# Patient Record
Sex: Female | Born: 1995
Health system: Southern US, Community
[De-identification: ages and names within clinical notes are randomized; demographics above are authoritative.]

## PROBLEM LIST (undated history)

## (undated) DIAGNOSIS — R569 Unspecified convulsions: Secondary | ICD-10-CM

## (undated) DIAGNOSIS — E559 Vitamin D deficiency, unspecified: Secondary | ICD-10-CM

## (undated) DIAGNOSIS — G919 Hydrocephalus, unspecified: Secondary | ICD-10-CM

## (undated) DIAGNOSIS — L732 Hidradenitis suppurativa: Secondary | ICD-10-CM

## (undated) DIAGNOSIS — G809 Cerebral palsy, unspecified: Secondary | ICD-10-CM

## (undated) DIAGNOSIS — Z8669 Personal history of other diseases of the nervous system and sense organs: Secondary | ICD-10-CM

## (undated) DIAGNOSIS — D649 Anemia, unspecified: Secondary | ICD-10-CM

## (undated) DIAGNOSIS — K5900 Constipation, unspecified: Secondary | ICD-10-CM

## (undated) DIAGNOSIS — Z982 Presence of cerebrospinal fluid drainage device: Secondary | ICD-10-CM

## (undated) DIAGNOSIS — K219 Gastro-esophageal reflux disease without esophagitis: Secondary | ICD-10-CM

## (undated) DIAGNOSIS — G43909 Migraine, unspecified, not intractable, without status migrainosus: Secondary | ICD-10-CM

## (undated) DIAGNOSIS — N319 Neuromuscular dysfunction of bladder, unspecified: Secondary | ICD-10-CM

## (undated) DIAGNOSIS — E039 Hypothyroidism, unspecified: Secondary | ICD-10-CM

## (undated) HISTORY — DX: Gastro-esophageal reflux disease without esophagitis: K21.9

## (undated) HISTORY — PX: VENTRICULOPERITONEAL SHUNT: SHX204

## (undated) HISTORY — PX: HAMSTRING LENGTHENING: SHX1722

## (undated) HISTORY — DX: Cerebral palsy, unspecified: G80.9

## (undated) HISTORY — DX: Neuromuscular dysfunction of bladder, unspecified: N31.9

## (undated) HISTORY — DX: Personal history of other diseases of the nervous system and sense organs: Z86.69

## (undated) HISTORY — DX: Hypothyroidism, unspecified: E03.9

## (undated) HISTORY — DX: Migraine, unspecified, not intractable, without status migrainosus: G43.909

## (undated) HISTORY — PX: DENTAL EXAMINATION UNDER ANESTHESIA: SHX1447

## (undated) HISTORY — PX: EYE SURGERY: SHX253

## (undated) HISTORY — PX: TENDON LENGTHENING: SHX395

## (undated) HISTORY — DX: Vitamin D deficiency, unspecified: E55.9

## (undated) HISTORY — DX: Constipation, unspecified: K59.00

## (undated) HISTORY — PX: BOTOX INJECTION: SHX5754

---
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
## 2015-01-12 ENCOUNTER — Encounter: Payer: Self-pay | Admitting: Physical Therapy

## 2015-01-12 ENCOUNTER — Ambulatory Visit: Payer: Medicaid Other | Attending: Pediatrics | Admitting: Physical Therapy

## 2015-01-12 DIAGNOSIS — Z7409 Other reduced mobility: Secondary | ICD-10-CM

## 2015-01-12 DIAGNOSIS — M6281 Muscle weakness (generalized): Secondary | ICD-10-CM

## 2015-01-12 NOTE — Therapy (Signed)
[facility] [address] [place], [place], [address] Phone: [phone]   Fax:  [phone]  Pediatric Physical Therapy Treatment  Patient Details  Name: Jody [name] MRN: [id number] Date of Birth: 01-02-1996 Referring Provider:  Velvet Bathe, MD  Encounter date: 01/12/2015      End of Session - 01/12/15 1655    Visit Number 58   Date for PT Re-Evaluation 05/24/15   Authorization Type Medicaid   Authorization Time Period 12/04/14-05/24/15   Authorization - Visit Number 3   Authorization - Number of Visits 24   PT Start Time 1505   PT Stop Time 1550   PT Time Calculation (min) 45 min   Equipment Utilized During Treatment Orthotics   Activity Tolerance Patient tolerated treatment well   Behavior During Therapy Willing to participate      Past Medical History  Diagnosis Date  . Seizures   . VP (ventriculoperitoneal) shunt status   . Hydrocephaly     Past Surgical History  Procedure Laterality Date  . Shunt replacement  2008    5th shunt for pt.   . Eye surgery    . Hamstring lengthening    . Tendon lengthening    . Botox injection    . Dental examination under anesthesia      There were no vitals filed for this visit.  Visit Diagnosis:Muscle weakness  Decreased mobility and endurance                    Pediatric PT Treatment - 01/12/15 1555    Subjective Information   Patient Comments Ariyona reports that work is going well.   PT Pediatric Exercise/Activities   Strengthening Activities Transitioning w/c to mat with SBA. Transition from supine to sitting at edge of mat with SBA to min assist x4 trials to each direction. Core strengthening with crunches with green wedge behind back x3 reps.   Balance Activities Performed   Balance Details Sitting on edge of mat without feet on floor with SBA.   Pain   Pain Assessment No/denies pain                 Patient Education -  01/12/15 1654    Education Provided Yes Recommended [name] to participate in transfers from supine to sitting with little assist especially from the left to the right. Patient and mother verbalized understanding.  Mom observed part of session.           Peds PT Short Term Goals - 01/12/15 1701    PEDS PT  SHORT TERM GOAL #3   Title Sister will be able to  transition from supine to sitting, sitting to supine with minimal assist 2/3 trials. right and left.    Baseline requires moderate assist to the right to left. (As of 12/01/14, Cyara required min-mod assist to transition from supine to sitting right to left.)   Time 6   Period Months   Status On-going   PEDS PT  SHORT TERM GOAL #5   Title Amberlin will be able to stand at ladder for at least 1 minute 3 out of 5 trials to demonstrate improve strength and balance  to assist with ADL activities with CGA   Baseline Max standing 30 seconds but averages about 20 seconds with minimal assist, AFOs donned. (As of 8//16, Tassie was able to stand for 1 minute for the first trial and 25 seconds for the last 2 trials with min A.)  Time 6   Period Months   Status On-going   PEDS PT  SHORT TERM GOAL #6   Title Kao will be able to independently lift her legs onto the mat when transitioning from sit to supine.   Baseline requires moderate assist to lift her legs onto the mat (As of 12/01/14, Mickie needed min assist to lift one leg but could independently lift the other leg.)   Time 6   Period Months   Status On-going          Peds PT Long Term Goals - 01/12/15 1701    PEDS PT  LONG TERM GOAL #1   Title Annaclaire will be able to assist with all transfers throughout the day and stand to help improve her ADLs   Time 6   Period Months   Status On-going          Plan - 01/12/15 1656    Clinical Impression Statement Garnet was able to transition from supine to sit with SBA-min assist. She has an easier time transferring to the right than the left,  requiring greater assistance to transfer to the left. Once she learned the motor planning piece, Maeryn had an easier time tranferring but still required some assistance to push through bilateral upper extremities. Karsynn did well sitting at edge of mat with feet unsupported. She had a lot of unsteadiness through her core in sitting but she did well self correcting to upright posture with minimal verbal cues to sit upright. Layni performed 3 reps of crunches with wedge behind her and she required increased time to complete but was able to sit up with only light assistance at back.   PT plan Continue with strengthening.      Problem List There are no active problems to display for this patient.   [name], SPT 01/12/2015, 5:02 PM  [name], PT 01/13/2015 1:38 PM Phone: [phone] Fax: [phone]  [facility] [address] [place], [place], [address] Phone: [phone]   Fax:  [phone]

## 2015-01-19 ENCOUNTER — Encounter: Payer: Self-pay | Admitting: Physical Therapy

## 2015-01-19 ENCOUNTER — Ambulatory Visit: Payer: Medicaid Other | Admitting: Physical Therapy

## 2015-01-19 DIAGNOSIS — M6281 Muscle weakness (generalized): Secondary | ICD-10-CM

## 2015-01-19 DIAGNOSIS — Z7409 Other reduced mobility: Secondary | ICD-10-CM

## 2015-01-19 NOTE — Therapy (Signed)
[facility] [address] [place], [place], [address] Phone: [phone]   Fax:  [phone]  Pediatric Physical Therapy Treatment  Patient Details  Name: Jody [name] MRN: [id number] Date of Birth: 28-May-1995 Referring Provider:  Velvet Bathe, MD  Encounter date: 01/19/2015      End of Session - 01/19/15 1724    Visit Number 59   Date for PT Re-Evaluation 05/24/15   Authorization Type Medicaid   Authorization Time Period 12/04/14-05/24/15   Authorization - Visit Number 4   Authorization - Number of Visits 24   PT Start Time 1515   PT Stop Time 1600   PT Time Calculation (min) 45 min   Equipment Utilized During Treatment Orthotics   Activity Tolerance Patient tolerated treatment well   Behavior During Therapy Willing to participate      Past Medical History  Diagnosis Date  . Seizures   . VP (ventriculoperitoneal) shunt status   . Hydrocephaly     Past Surgical History  Procedure Laterality Date  . Shunt replacement  2008    5th shunt for pt.   . Eye surgery    . Hamstring lengthening    . Tendon lengthening    . Botox injection    . Dental examination under anesthesia      There were no vitals filed for this visit.  Visit Diagnosis:Muscle weakness  Decreased mobility and endurance                    Pediatric PT Treatment - 01/19/15 1714    Subjective Information   Patient Comments Maison and mom report that things are going well at home.   PT Pediatric Exercise/Activities   Strengthening Activities Sit to stands x6 trials with maximum hold about 30 seconds with min assist.   Balance Activities Performed   Balance Details Sitting upright at edge of mat with feet unsupported with supervision.   Therapeutic Activities   Therapeutic Activity Details Sitting at edge of mat with feet unsupported laterally leaning to the left and right and return to upright sitting x4 trials in each  direction with supervision.   Pain   Pain Assessment No/denies pain                 Patient Education - 01/19/15 1719    Education Provided Yes   Education Description Instructed Mabelle to push through bilateral lower extremities and bring nose over toes to stand up.   Person(s) Educated Patient;Mother   Method Education Verbal explanation;Observed session   Comprehension Verbalized understanding          Peds PT Short Term Goals - 01/19/15 1727    PEDS PT  SHORT TERM GOAL #3   Title Denyce will be able to  transition from supine to sitting, sitting to supine with minimal assist 2/3 trials. right and left.    Baseline requires moderate assist to the right to left. (As of 12/01/14, Alania required min-mod assist to transition from supine to sitting right to left.)   Time 6   Period Months   Status On-going   PEDS PT  SHORT TERM GOAL #5   Title Clotiel will be able to stand at ladder for at least 1 minute 3 out of 5 trials to demonstrate improve strength and balance  to assist with ADL activities with CGA   Baseline Max standing 30 seconds but averages about 20 seconds with minimal assist, AFOs donned. (As of 8//16, Cayleigh was  able to stand for 1 minute for the first trial and 25 seconds for the last 2 trials with min A.)   Time 6   Period Months   Status On-going   PEDS PT  SHORT TERM GOAL #6   Title Murielle will be able to independently lift her legs onto the mat when transitioning from sit to supine.   Baseline requires moderate assist to lift her legs onto the mat (As of 12/01/14, Ladon needed min assist to lift one leg but could independently lift the other leg.)   Time 6   Period Months   Status On-going          Peds PT Long Term Goals - 01/19/15 1728    PEDS PT  LONG TERM GOAL #1   Title Sundai will be able to assist with all transfers throughout the day and stand to help improve her ADLs   Time 6   Period Months   Status On-going          Plan - 01/19/15  1724    Clinical Impression Statement Camiyah did really well with transitioning back to upright sitting after laterally leaning to the left and right to get puzzles pieces. On the last trial, she lost her balance in sitting when transitioning from the left to sitting. However, she was able to engage her core and control herself to return to upright sitting rather than fall back into supine on the mat. Adlynn is able to self correct unsteadiness in sitting with little assistance due to increasing core strength. Summerlyn is improving with sit to stands as she only requires min assist to transition to standing but only requires CGA and verbal cues to remain in standing.   PT plan Continue with strengthening.      Problem List There are no active problems to display for this patient.   [name], SPT 01/19/2015, 5:28 PM  [name], PT 01/20/2015 9:49 AM Phone: [phone] Fax: [phone]  [facility] [address] [place], [place], [address] Phone: [phone]   Fax:  [phone]

## 2015-01-26 ENCOUNTER — Ambulatory Visit: Payer: Medicaid Other | Admitting: Physical Therapy

## 2015-01-26 ENCOUNTER — Encounter: Payer: Self-pay | Admitting: Physical Therapy

## 2015-01-26 DIAGNOSIS — Z7409 Other reduced mobility: Secondary | ICD-10-CM

## 2015-01-26 DIAGNOSIS — M6281 Muscle weakness (generalized): Secondary | ICD-10-CM | POA: Diagnosis not present

## 2015-01-26 NOTE — Therapy (Signed)
[facility] [address] [place], [place], [address] Phone: [phone]   Fax:  [phone]  Pediatric Physical Therapy Treatment  Patient Details  Name: Jody [name] MRN: [id number] Date of Birth: Jan 24, 1996 Referring Provider:  Velvet Bathe, MD  Encounter date: 01/26/2015      End of Session - 01/26/15 1702    Visit Number 60   Date for PT Re-Evaluation 05/24/15   Authorization Type Medicaid   Authorization Time Period 12/04/14-05/24/15   Authorization - Visit Number 5   Authorization - Number of Visits 24   PT Start Time 1515   PT Stop Time 1600   PT Time Calculation (min) 45 min   Equipment Utilized During Treatment Orthotics   Activity Tolerance Patient tolerated treatment well   Behavior During Therapy Willing to participate      Past Medical History  Diagnosis Date  . Seizures   . VP (ventriculoperitoneal) shunt status   . Hydrocephaly     Past Surgical History  Procedure Laterality Date  . Shunt replacement  2008    5th shunt for pt.   . Eye surgery    . Hamstring lengthening    . Tendon lengthening    . Botox injection    . Dental examination under anesthesia      There were no vitals filed for this visit.  Visit Diagnosis:Muscle weakness  Decreased mobility and endurance                    Pediatric PT Treatment - 01/26/15 1651    Subjective Information   Patient Comments Jody [name] and mom reported that she is doing crunches in her chair at home and Jody [name] did stretching this morning.   PT Pediatric Exercise/Activities   Strengthening Activities Transfer from chair to mat to upright sitting with supervision.Transition sitting to/from supine requiring cues and assistance to lift lower extremities onto mat. Jody [name] also required cues to transition through sidelying. She needed minimal assistance today to transition from sidelying to sitting in both directions.   Balance  Activities Performed   Balance Details Sitting upright at edge of mat with feet unsupported with CGA with moderate sway noted.   Pain   Pain Assessment No/denies pain                 Patient Education - 01/26/15 1701    Education Provided Yes   Education Description Instructed Jody [name] on sequence of transitioning from sitting to supine through sidelying.   Person(s) Educated Patient;Mother   Method Education Verbal explanation;Observed session   Comprehension Verbalized understanding          Peds PT Short Term Goals - 01/26/15 1705    PEDS PT  SHORT TERM GOAL #3   Title Jody [name] will be able to  transition from supine to sitting, sitting to supine with minimal assist 2/3 trials. right and left.    Baseline requires moderate assist to the right to left. (As of 12/01/14, Jody [name] required min-mod assist to transition from supine to sitting right to left.)   Time 6   Period Months   Status On-going   PEDS PT  SHORT TERM GOAL #5   Title Jody [name] will be able to stand at ladder for at least 1 minute 3 out of 5 trials to demonstrate improve strength and balance  to assist with ADL activities with CGA   Baseline Max standing 30 seconds but averages about 20 seconds with minimal assist, AFOs donned. (As  of 8//16, Jody [name] was able to stand for 1 minute for the first trial and 25 seconds for the last 2 trials with min A.)   Time 6   Period Months   Status On-going   PEDS PT  SHORT TERM GOAL #6   Title Jody [name] will be able to independently lift her legs onto the mat when transitioning from sit to supine.   Baseline requires moderate assist to lift her legs onto the mat (As of 12/01/14, Jody [name] needed min assist to lift one leg but could independently lift the other leg.)   Time 6   Period Months   Status On-going          Peds PT Long Term Goals - 01/26/15 1706    PEDS PT  LONG TERM GOAL #1   Title Jody [name] will be able to assist with all transfers throughout the day and stand to help  improve her ADLs   Time 6   Period Months   Status On-going          Plan - 01/26/15 1702    Clinical Impression Statement Jody [name] had a bit more difficulty today than in previous sessions transitioning from sidelying to sitting upright at edge of mat. She only required min assist to start transfer. She still has difficulty lifting her lower extemities up onto mat and requires assistance to complete task. She was able to sit at edge of mat with feet unsupported but had some difficulty maintaining balance today. She was challenged to look up when sitting at edge of mat as she was looking at the ground to maintain balance and she required CGA when looking up today.   PT plan Continue with strengthening and sitting balance. Try using the Wii next session.      Problem List There are no active problems to display for this patient.   Jody [name], SPT 01/26/2015, 5:07 PM  [name], PT 01/27/2015 11:21 AM Phone: [phone] Fax: [phone]  [facility] [address] [place], [place], [address] Phone: [phone]   Fax:  [phone]

## 2015-02-02 ENCOUNTER — Encounter: Payer: Self-pay | Admitting: Physical Therapy

## 2015-02-02 ENCOUNTER — Ambulatory Visit: Payer: Medicaid Other | Attending: Pediatrics | Admitting: Physical Therapy

## 2015-02-02 DIAGNOSIS — R2681 Unsteadiness on feet: Secondary | ICD-10-CM | POA: Insufficient documentation

## 2015-02-02 DIAGNOSIS — M6281 Muscle weakness (generalized): Secondary | ICD-10-CM

## 2015-02-02 NOTE — Therapy (Signed)
[facility] [address] [place], [place], [address] Phone: [phone]   Fax:  [phone]  Pediatric Physical Therapy Treatment  Patient Details  Name: Jody [name] MRN: [id number] Date of Birth: [birth year] Referring Provider:  Velvet Bathe, MD  Encounter date: 02/02/2015      End of Session - 02/02/15 1747    Visit Number 61   Date for PT Re-Evaluation 05/24/15   Authorization Type Medicaid   Authorization Time Period 12/04/14-05/24/15   Authorization - Visit Number 6   Authorization - Number of Visits 24   PT Start Time 1515   PT Stop Time 1600   PT Time Calculation (min) 45 min   Equipment Utilized During Treatment Orthotics   Activity Tolerance Patient tolerated treatment well   Behavior During Therapy Willing to participate      Past Medical History  Diagnosis Date  . Seizures (HCC)   . VP (ventriculoperitoneal) shunt status   . Hydrocephaly     Past Surgical History  Procedure Laterality Date  . Shunt replacement  2008    5th shunt for pt.   . Eye surgery    . Hamstring lengthening    . Tendon lengthening    . Botox injection    . Dental examination under anesthesia      There were no vitals filed for this visit.  Visit Diagnosis:Muscle weakness  Unsteadiness                    Pediatric PT Treatment - 02/02/15 1744    Subjective Information   Patient Comments Jody [name] reports that she is doing well.   PT Pediatric Exercise/Activities   Strengthening Activities Transfer from chair to mat with supervision. Min assist to transfer to upright sitting as she was placed on a swiss disc.   Balance Activities Performed   Balance Details Sitting on swiss disc while using left upper extremity to play Wii with CGA to min assist. Cues for upright sitting to improve posture as Jody [name] hips were leaning to the right and shoulders were leaning to the left to try and maintain balance.   Pain   Pain Assessment No/denies pain                 Patient Education - 02/02/15 1746    Education Provided Yes   Education Description Discussed how to correct to upright posture with Jody [name].   Person(s) Educated Patient;Mother   Method Education Verbal explanation;Observed session   Comprehension Verbalized understanding          Peds PT Short Term Goals - 02/02/15 1749    PEDS PT  SHORT TERM GOAL #3   Title Reka will be able to  transition from supine to sitting, sitting to supine with minimal assist 2/3 trials. right and left.    Baseline requires moderate assist to the right to left. (As of 12/01/14, Tiyana required min-mod assist to transition from supine to sitting right to left.)   Time 6   Period Months   Status On-going   PEDS PT  SHORT TERM GOAL #5   Title Jody [name] will be able to stand at ladder for at least 1 minute 3 out of 5 trials to demonstrate improve strength and balance  to assist with ADL activities with CGA   Baseline Max standing 30 seconds but averages about 20 seconds with minimal assist, AFOs donned. (As of 8//16, Taji was able to stand for 1 minute for the first  trial and 25 seconds for the last 2 trials with min A.)   Time 6   Period Months   Status On-going   PEDS PT  SHORT TERM GOAL #6   Title Jody [name] will be able to independently lift her legs onto the mat when transitioning from sit to supine.   Baseline requires moderate assist to lift her legs onto the mat (As of 12/01/14, Lind needed min assist to lift one leg but could independently lift the other leg.)   Time 6   Period Months   Status On-going          Peds PT Long Term Goals - 02/02/15 1750    PEDS PT  LONG TERM GOAL #1   Title Jody [name] will be able to assist with all transfers throughout the day and stand to help improve her ADLs   Time 6   Period Months   Status On-going          Plan - 02/02/15 1747    Clinical Impression Statement Jody [name] did well sitting on swiss  disc with feet supported while using left upper extremity to engage in task. Initially, she was able to recognize a loss of balance and correct to upright posture without cues but as she became fatigued she required increased assistance. At the end of session, Dealva's trunk muscles were fatigued and she required close CGA to maintain static sitting posture on swiss disc.   PT plan Continue with sitting balance and sit to stands.      Problem List There are no active problems to display for this patient.   [name], SPT 02/02/2015, 5:51 PM  [name], PT 02/03/2015 10:37 AM Phone: [phone] Fax: [phone]  [facility] [address] [place], [place], [address] Phone: [phone]   Fax:  [phone]

## 2015-02-09 ENCOUNTER — Ambulatory Visit: Payer: Medicaid Other | Admitting: Physical Therapy

## 2015-02-16 ENCOUNTER — Ambulatory Visit: Payer: Medicaid Other | Admitting: Physical Therapy

## 2015-02-16 DIAGNOSIS — M6281 Muscle weakness (generalized): Secondary | ICD-10-CM

## 2015-02-17 ENCOUNTER — Encounter: Payer: Self-pay | Admitting: Physical Therapy

## 2015-02-17 NOTE — Therapy (Signed)
[facility] [address] [place], [place], [address] Phone: [phone]   Fax:  [phone]  Pediatric Physical Therapy Treatment  Patient Details  Name: Jody [name] MRN: [id number] Date of Birth: 05/11/1995 No Data Recorded  Encounter date: 02/16/2015      End of Session - 02/17/15 1340    Visit Number 62   Date for PT Re-Evaluation 05/24/15   Authorization Type Medicaid   Authorization Time Period 12/04/14-05/24/15   Authorization - Visit Number 7   Authorization - Number of Visits 24   PT Start Time 1530   PT Stop Time 1600   PT Time Calculation (min) 30 min   Equipment Utilized During Treatment Orthotics   Activity Tolerance Patient tolerated treatment well   Behavior During Therapy Willing to participate      Past Medical History  Diagnosis Date  . Seizures (HCC)   . VP (ventriculoperitoneal) shunt status   . Hydrocephaly     Past Surgical History  Procedure Laterality Date  . Shunt replacement  2008    5th shunt for pt.   . Eye surgery    . Hamstring lengthening    . Tendon lengthening    . Botox injection    . Dental examination under anesthesia      There were no vitals filed for this visit.  Visit Diagnosis:Muscle weakness                    Pediatric PT Treatment - 02/17/15 1322    Subjective Information   Patient Comments Jody [name] reports she got the Wii but mom will not let her play until Christmas.    PT Pediatric Exercise/Activities   Strengthening Activities Transfer from chair to mat with supervision. Slight assist to transfer to upright sitting. Lateral reaches and return to upright sitting with cues not to use UE assist.   Occasionally CGA for safety.  Supine to sit with green wedge with CGA- SBA x 10, min assist last 2 trials.    Pain   Pain Assessment No/denies pain                 Patient Education - 02/17/15 1340    Education Provided No           Peds PT Short Term Goals - 02/02/15 1749    PEDS PT  SHORT TERM GOAL #3   Title Jody [name] will be able to  transition from supine to sitting, sitting to supine with minimal assist 2/3 trials. right and left.    Baseline requires moderate assist to the right to left. (As of 12/01/14, Jody [name] required min-mod assist to transition from supine to sitting right to left.)   Time 6   Period Months   Status On-going   PEDS PT  SHORT TERM GOAL #5   Title Jody [name] will be able to stand at ladder for at least 1 minute 3 out of 5 trials to demonstrate improve strength and balance  to assist with ADL activities with CGA   Baseline Max standing 30 seconds but averages about 20 seconds with minimal assist, AFOs donned. (As of 8//16, Jody [name] was able to stand for 1 minute for the first trial and 25 seconds for the last 2 trials with min A.)   Time 6   Period Months   Status On-going   PEDS PT  SHORT TERM GOAL #6   Title Jody [name] will be able to independently lift her legs onto the mat  when transitioning from sit to supine.   Baseline requires moderate assist to lift her legs onto the mat (As of 12/01/14, Jody [name] needed min assist to lift one leg but could independently lift the other leg.)   Time 6   Period Months   Status On-going          Peds PT Long Term Goals - 02/02/15 1750    PEDS PT  LONG TERM GOAL #1   Title Jody [name] will be able to assist with all transfers throughout the day and stand to help improve her ADLs   Time 6   Period Months   Status On-going          Plan - 02/17/15 1341    Clinical Impression Statement Jody [name]'s w/c controls have not been fixed yet.  Encouraged mom to contact the vendor. Increased loss of balance lateral reaches to the right requiring UE assist to catch self.  Several without UE after cueing.    PT plan Continue with sitting balance and sit to stands.       Problem List There are no active problems to display for this patient.  [name], PT 02/17/2015  1:46 PM Phone: [phone] Fax: [phone]  [facility] [address] [place], [place], [address] Phone: [phone]   Fax:  [phone]  Name: Jody [name] MRN: [id number] Date of Birth: 07/23/1995

## 2015-02-23 ENCOUNTER — Ambulatory Visit: Payer: Medicaid Other | Admitting: Physical Therapy

## 2015-02-23 ENCOUNTER — Encounter: Payer: Self-pay | Admitting: Physical Therapy

## 2015-02-23 DIAGNOSIS — R2681 Unsteadiness on feet: Secondary | ICD-10-CM

## 2015-02-23 DIAGNOSIS — M6281 Muscle weakness (generalized): Secondary | ICD-10-CM | POA: Diagnosis not present

## 2015-02-23 NOTE — Therapy (Signed)
[facility] [address] [place], [place], [address] Phone: [phone]   Fax:  [phone]  Pediatric Physical Therapy Treatment  Patient Details  Name: Jody [name] MRN: [id number] Date of Birth: 16-Jun-1995 No Data Recorded  Encounter date: 02/23/2015      End of Session - 02/23/15 1722    Visit Number 63   Date for PT Re-Evaluation 05/24/15   Authorization Type Medicaid   Authorization Time Period 12/04/14-05/24/15   Authorization - Visit Number 8   Authorization - Number of Visits 24   PT Start Time 1515   PT Stop Time 1600   PT Time Calculation (min) 45 min   Equipment Utilized During Treatment Orthotics   Activity Tolerance Patient tolerated treatment well   Behavior During Therapy Willing to participate      Past Medical History  Diagnosis Date  . Seizures (HCC)   . VP (ventriculoperitoneal) shunt status   . Hydrocephaly     Past Surgical History  Procedure Laterality Date  . Shunt replacement  2008    5th shunt for pt.   . Eye surgery    . Hamstring lengthening    . Tendon lengthening    . Botox injection    . Dental examination under anesthesia      There were no vitals filed for this visit.  Visit Diagnosis:Unsteadiness  Muscle weakness                    Pediatric PT Treatment - 02/23/15 1717    Subjective Information   Patient Comments Jody [name] and mom report that she is getting in her stander often at home.   PT Pediatric Exercise/Activities   Strengthening Activities Transfer from chair to mat with supervision. Min [name] to transfer to upright sitting as she was placed on a swiss disc. Sit to stands with CGA to min [name] and upper extremities on ladder for assistance and verbal cues to push through her legs.   Balance Activities Performed   Balance Details Sit on swis disc with feet supported on bench while reaching to each side to get puzzle pieces. Sitting on swiss  disc with trunk perturbations while maintaining upright sitting posture. Sitting on swiss disc with cross body reaching while reaching for SPT's hand in different locations. Greater difficulty reaching the left arm across to the right.   Pain   Pain Assessment No/denies pain                 Patient Education - 02/23/15 1721    Education Provided Yes   Education Description Discussed progress being made with Jody [name] as she was discouraged at end of session. Educated Jody [name] about upright posture.   Person(s) Educated Patient;Mother   Method Education Verbal explanation;Observed session   Comprehension Verbalized understanding          Peds PT Short Term Goals - 02/23/15 1727    PEDS PT  SHORT TERM GOAL #3   Title Jody [name] to  transition from supine to sitting, sitting to supine with minimal [name] 2/3 trials. right and left.    Baseline requires moderate [name] to the right to left. (As of 12/01/14, Jody [name] to transition from supine to sitting right to left.)   Time 6   Period Months   Status On-going   PEDS PT  SHORT TERM GOAL #5   Title Jody [name] will be [name] to stand at ladder for at least 1 minute  3 out of 5 trials to demonstrate improve strength and balance  to [name] with ADL activities with CGA   Baseline Max standing 30 seconds but averages about 20 seconds with minimal [name], AFOs donned. (As of 8//16, Jody [name] was [name] to stand for 1 minute for the first trial and 25 seconds for the last 2 trials with min A.)   Time 6   Period Months   Status On-going   PEDS PT  SHORT TERM GOAL #6   Title Jody [name] will be [name] to independently lift her legs onto the mat when transitioning from sit to supine.   Baseline requires moderate [name] to lift her legs onto the mat (As of 12/01/14, Jody [name] needed min [name] to lift one leg but could independently lift the other leg.)   Time 6   Period Months   Status On-going          Peds PT Long Term Goals -  02/23/15 1728    PEDS PT  LONG TERM GOAL #1   Title Jody [name] will be [name] to [name] with all transfers throughout the day and stand to help improve her ADLs   Time 6   Period Months   Status On-going          Plan - 02/23/15 1723    Clinical Impression Statement Jody [name] had great upright sitting posture on the swiss disc today. She needed some cues to decrease upper extremity assistance as she became fatigued but was [name] to hold herself upright with supervision and correct her posture as needed. She had much greater difficulty reaching her left arm across her body to the right and was unable to achieve the same range as the right arm. Jody [name] had some difficulty with sit to stands today as her core muscles were fatigued but once she was standing she did well maintaining position.  Jody [name] was very discouraged at the end of the session about her standing today as her mom reports she does better at home. Jody [name] was educated that we fatigued her core muscles before standing and without strong core muscles it is harder to stand but that she is making great progress in therapy sessions.   PT plan Perform sit to stands first the continue with sitting balance.      Problem List There are no active problems to display for this patient.   [name], SPT 02/23/2015, 5:28 PM  Dellie BurnsFlavia Mowlanejad, PT 02/24/2015 12:47 PM Phone: [phone] Fax: [phone]  [facility] [address] [place], [place], [address] Phone: [phone]   Fax:  [phone]  Name: Jody [name] MRN: [id number] Date of Birth: 23-Feb-1996

## 2015-03-02 ENCOUNTER — Encounter: Payer: Self-pay | Admitting: Physical Therapy

## 2015-03-02 ENCOUNTER — Ambulatory Visit: Payer: Medicaid Other | Admitting: Physical Therapy

## 2015-03-02 DIAGNOSIS — M6281 Muscle weakness (generalized): Secondary | ICD-10-CM | POA: Diagnosis not present

## 2015-03-02 DIAGNOSIS — R2681 Unsteadiness on feet: Secondary | ICD-10-CM

## 2015-03-02 NOTE — Therapy (Signed)
[facility] [address] [place], [place], [address] Phone: [phone]   Fax:  [phone]  Pediatric Physical Therapy Treatment  Patient Details  Name: Jody [name] MRN: [id number] Date of Birth: Sep 14, 1995 No Data Recorded  Encounter date: 03/02/2015      End of Session - 03/02/15 1746    Visit Number 64   Date for PT Re-Evaluation 05/24/15   Authorization Type Medicaid   Authorization Time Period 12/04/14-05/24/15   Authorization - Visit Number 9   Authorization - Number of Visits 24   PT Start Time 1515   PT Stop Time 1600   PT Time Calculation (min) 45 min   Equipment Utilized During Treatment Orthotics   Activity Tolerance Patient tolerated treatment well   Behavior During Therapy Willing to participate      Past Medical History  Diagnosis Date  . Seizures (HCC)   . VP (ventriculoperitoneal) shunt status   . Hydrocephaly     Past Surgical History  Procedure Laterality Date  . Shunt replacement  2008    5th shunt for pt.   . Eye surgery    . Hamstring lengthening    . Tendon lengthening    . Botox injection    . Dental examination under anesthesia      There were no vitals filed for this visit.  Visit Diagnosis:Unsteadiness  Muscle weakness                    Pediatric PT Treatment - 03/02/15 1743    Subjective Information   Patient Comments [name] was excited for Halloween.   PT Pediatric Exercise/Activities   Strengthening Activities Sit to stands x5 trials using ladder with CGA-min assist with maximum hold 30-60 seconds.  Transfer from supine to sitting edge of mat through sidelying x2 trials in both directions with min assist on last trial transferring from left side lying.   Balance Activities Performed   Balance Details Sit at edge of mat with feet unsupported with no loss of balance noted while participating in puzzle and no upper extremity assistance.   Pain   Pain  Assessment No/denies pain                 Patient Education - 03/02/15 1745    Education Provided Yes   Education Description Disucssed with [name] that standing at edge of mat using ladder is much harder than standing in stander as she does not have support to block her knees from flexing so her progress being made is very good.   Person(s) Educated Patient;Mother   Method Education Verbal explanation;Observed session   Comprehension Verbalized understanding          Peds PT Short Term Goals - 03/02/15 1748    PEDS PT  SHORT TERM GOAL #3   Title [name] will be able to  transition from supine to sitting, sitting to supine with minimal assist 2/3 trials. right and left.    Baseline requires moderate assist to the right to left. (As of 12/01/14, Elaysha required min-mod assist to transition from supine to sitting right to left.)   Time 6   Period Months   Status On-going   PEDS PT  SHORT TERM GOAL #5   Title [name] will be able to stand at ladder for at least 1 minute 3 out of 5 trials to demonstrate improve strength and balance  to assist with ADL activities with CGA   Baseline Max standing 30 seconds  but averages about 20 seconds with minimal assist, AFOs donned. (As of 8//16, [name] was able to stand for 1 minute for the first trial and 25 seconds for the last 2 trials with min A.)   Time 6   Period Months   Status On-going   PEDS PT  SHORT TERM GOAL #6   Title [name] will be able to independently lift her legs onto the mat when transitioning from sit to supine.   Baseline requires moderate assist to lift her legs onto the mat (As of 12/01/14, [name] needed min assist to lift one leg but could independently lift the other leg.)   Time 6   Period Months   Status On-going          Peds PT Long Term Goals - 03/02/15 1749    PEDS PT  LONG TERM GOAL #1   Title [name] will be able to assist with all transfers throughout the day and stand to help improve her ADLs   Time 6    Period Months   Status On-going          Plan - 03/02/15 1746    Clinical Impression Statement [name] continues to improve with her sit to stands and standing balance but still requires cues to stand upright. Her sitting balance is improving as she was able to sit edge of mat without her feet supported and no upper extremity assistance and maintain her balance with no unsteadiness noted. She continues to have a more difficult time transferring from sidelying to sitting from the left to the right requiring min assist on the last trial but has improved since last time. [name] reported that she could not feel herself leaning off to one side with sitting balance so we discussed using a mirror so that she can see her posture.   PT plan Work on sitting balance with mirror.      Problem List There are no active problems to display for this patient.   [name], SPT 03/02/2015, 5:51 PM Dellie BurnsFlavia Mowlanejad, PT 03/03/2015 9:35 PM Phone: [phone] Fax: [phone]  [facility] [address] [place], [place], [address] Phone: [phone]   Fax:  [phone]  Name: Jody [name] MRN: [id number] Date of Birth: 10-08-1995

## 2015-03-09 ENCOUNTER — Ambulatory Visit: Payer: Medicaid Other | Attending: Pediatrics | Admitting: Physical Therapy

## 2015-03-09 ENCOUNTER — Encounter: Payer: Self-pay | Admitting: Physical Therapy

## 2015-03-09 DIAGNOSIS — R2681 Unsteadiness on feet: Secondary | ICD-10-CM | POA: Diagnosis present

## 2015-03-09 DIAGNOSIS — Z7409 Other reduced mobility: Secondary | ICD-10-CM | POA: Insufficient documentation

## 2015-03-09 DIAGNOSIS — M6281 Muscle weakness (generalized): Secondary | ICD-10-CM | POA: Insufficient documentation

## 2015-03-09 NOTE — Therapy (Signed)
[facility] [address] [place], [place], [address] Phone: [phone]   Fax:  [phone]  Pediatric Physical Therapy Treatment  Patient Details  Name: Jody [name] MRN: [id number] Date of Birth: 1995/10/19 No Data Recorded  Encounter date: 03/09/2015      End of Session - 03/09/15 1723    Visit Number 65   Date for PT Re-Evaluation 05/24/15   Authorization Type Medicaid   Authorization Time Period 12/04/14-05/24/15   Authorization - Visit Number 10   Authorization - Number of Visits 24   PT Start Time 1515   PT Stop Time 1600   PT Time Calculation (min) 45 min   Equipment Utilized During Treatment Orthotics   Activity Tolerance Patient tolerated treatment well   Behavior During Therapy Willing to participate      Past Medical History  Diagnosis Date  . Seizures (HCC)   . VP (ventriculoperitoneal) shunt status   . Hydrocephaly     Past Surgical History  Procedure Laterality Date  . Shunt replacement  2008    5th shunt for pt.   . Eye surgery    . Hamstring lengthening    . Tendon lengthening    . Botox injection    . Dental examination under anesthesia      There were no vitals filed for this visit.  Visit Diagnosis:Unsteadiness  Muscle weakness                    Pediatric PT Treatment - 03/09/15 1719    Subjective Information   Patient Comments Jody [name] reports that she is doing well at home.   PT Pediatric Exercise/Activities   Strengthening Activities Transfer from wheelchair to mat with supervision. Sit to stands x7 trials using ladder with SBA-CGA with maximum hold 30-60 seconds.   Balance Activities Performed   Balance Details Sit at edge of mat with feet unsupported practicing sitting balance with mirror in front to assist with upright positioning.    Pain   Pain Assessment 0-10  2/10 right knee pain with standing                 Patient Education - 03/09/15  1722    Education Provided Yes   Education Description Discussed with Jody [name] how to use mirror to assist with upright posture and sitting balance.   Person(s) Educated Patient;Mother   Method Education Verbal explanation;Observed session   Comprehension Verbalized understanding          Peds PT Short Term Goals - 03/09/15 1725    PEDS PT  SHORT TERM GOAL #3   Title Jody [name] will be able to  transition from supine to sitting, sitting to supine with minimal assist 2/3 trials. right and left.    Baseline requires moderate assist to the right to left. (As of 12/01/14, Jody [name] required min-mod assist to transition from supine to sitting right to left.)   Time 6   Period Months   Status On-going   PEDS PT  SHORT TERM GOAL #5   Title Jody [name] will be able to stand at ladder for at least 1 minute 3 out of 5 trials to demonstrate improve strength and balance  to assist with ADL activities with CGA   Baseline Max standing 30 seconds but averages about 20 seconds with minimal assist, AFOs donned. (As of 8//16, Jody [name] was able to stand for 1 minute for the first trial and 25 seconds for the last 2 trials with min A.)  Time 6   Period Months   Status On-going   PEDS PT  SHORT TERM GOAL #6   Title Jody [name] will be able to independently lift her legs onto the mat when transitioning from sit to supine.   Baseline requires moderate assist to lift her legs onto the mat (As of 12/01/14, Jody [name] needed min assist to lift one leg but could independently lift the other leg.)   Time 6   Period Months   Status On-going          Peds PT Long Term Goals - 03/09/15 1726    PEDS PT  LONG TERM GOAL #1   Title Jody [name] will be able to assist with all transfers throughout the day and stand to help improve her ADLs   Time 6   Period Months   Status On-going          Plan - 03/09/15 1723    Clinical Impression Statement Jody [name] did really well with her sit to stands as assistance was decreased and she was able to  continue with activity well. She reported that her right knee felt like it was "popping" with standing but had no further complaints of pain after activity ended. She did well using the mirror to correct upright posture while sitting at edge of mat.  It was noted that she had better sitting balance when engaged in puzzle rather than focusing solely on sitting. Jody [name] was able to self correct posture and losses of balance using mirror as she was able to see what she was doing.   PT plan Continue with sitting balance.      Problem List There are no active problems to display for this patient.   Meribeth Matteslexandra Keelin Neville, SPT 03/09/2015, 5:27 PM  Dellie BurnsFlavia Mowlanejad, PT 03/10/2015 8:17 AM Phone: [phone] Fax: [phone]  [facility] [address] [place], [place], [address] Phone: [phone]   Fax:  [phone]  Name: Jody [name] MRN: [id number] Date of Birth: Mar 25, 1996

## 2015-03-16 ENCOUNTER — Ambulatory Visit: Payer: Medicaid Other | Admitting: Physical Therapy

## 2015-03-23 ENCOUNTER — Encounter: Payer: Self-pay | Admitting: Physical Therapy

## 2015-03-23 ENCOUNTER — Ambulatory Visit: Payer: Medicaid Other | Admitting: Physical Therapy

## 2015-03-23 DIAGNOSIS — Z7409 Other reduced mobility: Secondary | ICD-10-CM

## 2015-03-23 DIAGNOSIS — M6281 Muscle weakness (generalized): Secondary | ICD-10-CM

## 2015-03-23 DIAGNOSIS — R2681 Unsteadiness on feet: Secondary | ICD-10-CM | POA: Diagnosis not present

## 2015-03-23 NOTE — Therapy (Signed)
[facility] [address] [place], [place], [address] Phone: [phone]   Fax:  [phone]  Pediatric Physical Therapy Treatment  Patient Details  Name: Jody [name] MRN: [id number] Date of Birth: Jan 21, 1996 No Data Recorded  Encounter date: 03/23/2015      End of Session - 03/23/15 1703    Visit Number 66   Date for PT Re-Evaluation 05/24/15   Authorization Type Medicaid   Authorization Time Period 12/04/14-05/24/15   Authorization - Visit Number 11   Authorization - Number of Visits 24   PT Start Time 1515   PT Stop Time 1600   PT Time Calculation (min) 45 min   Equipment Utilized During Treatment Orthotics   Activity Tolerance Patient tolerated treatment well   Behavior During Therapy Willing to participate      Past Medical History  Diagnosis Date  . Seizures (HCC)   . VP (ventriculoperitoneal) shunt status   . Hydrocephaly     Past Surgical History  Procedure Laterality Date  . Shunt replacement  2008    5th shunt for pt.   . Eye surgery    . Hamstring lengthening    . Tendon lengthening    . Botox injection    . Dental examination under anesthesia      There were no vitals filed for this visit.  Visit Diagnosis:Muscle weakness  Decreased mobility and endurance                    Pediatric PT Treatment - 03/23/15 1659    Subjective Information   Patient Comments Jody [name] reports she is excited she doesn't have to work the whole week this week.    PT Pediatric Exercise/Activities   Strengthening Activities Transferred w/c to mat with supervision.  Minimal assist to assume supine on the mat. Heel slides x 15 each leg. Minimal assist to keep LE adducted. Supine Abd/Add with min to moderate assist. Clam shell sidelying with minimal assist to complete activity. Sitting lateral forearm touch back to midline x 10 each side.  Sit to supine crunches with pillow assist. Jody [name] held PT  finger but most work completed by Bed Bath & Beyondaylor.  Sitting in w/c hip adduction instruction with pillow.    Pain   Pain Assessment No/denies pain                 Patient Education - 03/23/15 1702    Education Provided Yes   Education Description Hip adduction with pillow or ball holding for a count of 5 x10 each day. Heel slides in supine x 10  daily each leg.    Person(s) Educated Science writeratient;Caregiver   Method Education Verbal explanation;Discussed session   Comprehension Returned demonstration          Peds PT Short Term Goals - 03/09/15 1725    PEDS PT  SHORT TERM GOAL #3   Title Jody [name] will be able to  transition from supine to sitting, sitting to supine with minimal assist 2/3 trials. right and left.    Baseline requires moderate assist to the right to left. (As of 12/01/14, Jody [name] required min-mod assist to transition from supine to sitting right to left.)   Time 6   Period Months   Status On-going   PEDS PT  SHORT TERM GOAL #5   Title Jody [name] will be able to stand at ladder for at least 1 minute 3 out of 5 trials to demonstrate improve strength and balance  to assist with ADL  activities with CGA   Baseline Max standing 30 seconds but averages about 20 seconds with minimal assist, AFOs donned. (As of 8//16, Jody [name] was able to stand for 1 minute for the first trial and 25 seconds for the last 2 trials with min A.)   Time 6   Period Months   Status On-going   PEDS PT  SHORT TERM GOAL #6   Title Jody [name] will be able to independently lift her legs onto the mat when transitioning from sit to supine.   Baseline requires moderate assist to lift her legs onto the mat (As of 12/01/14, Jody [name] needed min assist to lift one leg but could independently lift the other leg.)   Time 6   Period Months   Status On-going          Peds PT Long Term Goals - 03/09/15 1726    PEDS PT  LONG TERM GOAL #1   Title Jody [name] will be able to assist with all transfers throughout the day and stand to help  improve her ADLs   Time 6   Period Months   Status On-going          Plan - 03/23/15 1704    Clinical Impression Statement Jody [name] continues to demonstrate moderate weakness in her LE.  Greater with hip adduction than abduction.  She reports she really does not work on these exercises at home.  Highly recommended she perform these exercises at home.     PT plan Continue to work on LE strengthening activities.       Problem List There are no active problems to display for this patient.   Jody [name], PT 03/23/2015 5:06 PM Phone: [phone] Fax: [phone]   [facility] [address] [place], [place], [address] Phone: [phone]   Fax:  [phone]  Name: Jody [name] MRN: [id number] Date of Birth: April 26, 1996

## 2015-03-27 DIAGNOSIS — K5900 Constipation, unspecified: Secondary | ICD-10-CM | POA: Insufficient documentation

## 2015-03-30 ENCOUNTER — Ambulatory Visit: Payer: Medicaid Other | Admitting: Physical Therapy

## 2015-03-30 DIAGNOSIS — R2681 Unsteadiness on feet: Secondary | ICD-10-CM | POA: Diagnosis not present

## 2015-03-30 DIAGNOSIS — M6281 Muscle weakness (generalized): Secondary | ICD-10-CM

## 2015-03-31 ENCOUNTER — Encounter: Payer: Self-pay | Admitting: Physical Therapy

## 2015-03-31 NOTE — Therapy (Signed)
[facility] [address] [place], [place], [address] Phone: [phone]   Fax:  [phone]  Pediatric Physical Therapy Treatment  Patient Details  Name: [name] Posner MRN: [id number] Date of Birth: 05-Mar-1996 No Data Recorded  Encounter date: 03/30/2015      End of Session - 03/31/15 0939    Visit Number 67   Date for PT Re-Evaluation 05/24/15   Authorization Type Medicaid   Authorization Time Period 12/04/14-05/24/15   Authorization - Visit Number 12   Authorization - Number of Visits 24   PT Start Time 1515   PT Stop Time 1600   PT Time Calculation (min) 45 min   Equipment Utilized During Treatment Orthotics   Activity Tolerance Patient tolerated treatment well   Behavior During Therapy Willing to participate      Past Medical History  Diagnosis Date  . Seizures (HCC)   . VP (ventriculoperitoneal) shunt status   . Hydrocephaly     Past Surgical History  Procedure Laterality Date  . Shunt replacement  2008    5th shunt for pt.   . Eye surgery    . Hamstring lengthening    . Tendon lengthening    . Botox injection    . Dental examination under anesthesia      There were no vitals filed for this visit.  Visit Diagnosis:Muscle weakness                    Pediatric PT Treatment - 03/31/15 0843    Subjective Information   Patient Comments [name] reports she did not perform her HEP given last session.    PT Pediatric Exercise/Activities   Strengthening Activities W/c sitting exercises:  Adduction squeezing 6" round ball with a 10 count hold x10, [place] shack off foot x4 each LE.  Hip flexion ~2" lift x 12 each LE, LAQ x 10 with minimal movement. Cone kicking with assist to keep cone from sliding forward.    Seated Stepper   Other Endurance Exercise/Activities UBE level 2.0, 12 minutes.   Pain   Pain Assessment No/denies pain                 Patient Education - 03/31/15  0847    Education Provided Yes   Education Description Mom came at end of session.  Demonstrated hip flexion, hip adduction and cone kicking to be repeated at home.    Person(s) Educated Science writeratient;Caregiver   Method Education Verbal explanation;Discussed session   Comprehension Returned demonstration          Peds PT Short Term Goals - 03/09/15 1725    PEDS PT  SHORT TERM GOAL #3   Title [name] will be able to  transition from supine to sitting, sitting to supine with minimal assist 2/3 trials. right and left.    Baseline requires moderate assist to the right to left. (As of 12/01/14, Jamia required min-mod assist to transition from supine to sitting right to left.)   Time 6   Period Months   Status On-going   PEDS PT  SHORT TERM GOAL #5   Title [name] will be able to stand at ladder for at least 1 minute 3 out of 5 trials to demonstrate improve strength and balance  to assist with ADL activities with CGA   Baseline Max standing 30 seconds but averages about 20 seconds with minimal assist, AFOs donned. (As of 8//16, [name] was able to stand for 1 minute for the first  trial and 25 seconds for the last 2 trials with min A.)   Time 6   Period Months   Status On-going   PEDS PT  SHORT TERM GOAL #6   Title Autum will be able to independently lift her legs onto the mat when transitioning from sit to supine.   Baseline requires moderate assist to lift her legs onto the mat (As of 12/01/14, Danitza needed min assist to lift one leg but could independently lift the other leg.)   Time 6   Period Months   Status On-going          Peds PT Long Term Goals - 03/09/15 1726    PEDS PT  LONG TERM GOAL #1   Title Laprecious will be able to assist with all transfers throughout the day and stand to help improve her ADLs   Time 6   Period Months   Status On-going          Plan - 03/31/15 0939    Clinical Impression Statement Shanya demonstrates moderate weakness in her LE with the strengthening  activities.  Some of the activities she required PROM to educate on the movement of the exercise.  It seems like neuromuscular re-education.  Once she was able to activate the muscle, the activity was successful.  Increased difficulty and increased weakness noted with the left LE.  We discussed in detail the importance of HEP carryover and how strength in her legs will assist with weight bearing activities.    PT plan Continue with LE strengthening activities.       Problem List There are no active problems to display for this patient.   [name], PT 03/31/2015 9:43 AM Phone: [phone] Fax: [phone]  [facility] [address] [place], [place], [address] Phone: [phone]   Fax:  [phone]  Name: Elfida Shimada MRN: [id number] Date of Birth: [birth year]

## 2015-04-06 ENCOUNTER — Ambulatory Visit: Payer: Medicaid Other | Attending: Pediatrics | Admitting: Physical Therapy

## 2015-04-06 DIAGNOSIS — M6281 Muscle weakness (generalized): Secondary | ICD-10-CM | POA: Diagnosis not present

## 2015-04-06 DIAGNOSIS — Z7409 Other reduced mobility: Secondary | ICD-10-CM

## 2015-04-06 NOTE — Therapy (Signed)
[facility] [address] [place], [place], [address] Phone: [phone]   Fax:  [phone]  Pediatric Physical Therapy Treatment  Patient Details  Name: Jody [name] MRN: [id number] Date of Birth: 11-05-1995 No Data Recorded  Encounter date: 04/06/2015      End of Session - 04/06/15 1706    Visit Number 68   Date for PT Re-Evaluation 05/24/15   Authorization Type Medicaid   Authorization Time Period 12/04/14-05/24/15   Authorization - Visit Number 13   Authorization - Number of Visits 24   PT Start Time 1515   PT Stop Time 1600   PT Time Calculation (min) 45 min   Equipment Utilized During Treatment Orthotics   Activity Tolerance Patient tolerated treatment well   Behavior During Therapy Willing to participate      Past Medical History  Diagnosis Date  . Seizures (HCC)   . VP (ventriculoperitoneal) shunt status   . Hydrocephaly     Past Surgical History  Procedure Laterality Date  . Shunt replacement  2008    5th shunt for pt.   . Eye surgery    . Hamstring lengthening    . Tendon lengthening    . Botox injection    . Dental examination under anesthesia      There were no vitals filed for this visit.  Visit Diagnosis:Muscle weakness  Decreased mobility and endurance                    Pediatric PT Treatment - 04/06/15 1703    Subjective Information   Patient Comments Dorthea and mom asked if a church member was able to attend one of Jody [name]'s treatment session.    PT Pediatric Exercise/Activities   Strengthening Activities Prone activity with cues to keep propped on forearms and to position the UE under her trunk inline with shoulders. Assisted to assume partial quadruped on forearms vs extended UE.  Min to moderate assist to lift up on knees.    Therapeutic Activities   Therapeutic Activity Details Transition from w/c to prone on mat table with only assist to position LE properly.   Transition from prone to sitting with assist from supine to sit moderate due to positioned too far posteriorly on mat table.     Pain   Pain Assessment No/denies pain                 Patient Education - 04/06/15 1705    Education Provided Yes   Education Description continue previous HEP.    Person(s) Educated Mother;Patient   Method Education Verbal explanation;Discussed session   Comprehension Returned demonstration          Peds PT Short Term Goals - 03/09/15 1725    PEDS PT  SHORT TERM GOAL #3   Title Jody [name] will be able to  transition from supine to sitting, sitting to supine with minimal assist 2/3 trials. right and left.    Baseline requires moderate assist to the right to left. (As of 12/01/14, Jody [name] required min-mod assist to transition from supine to sitting right to left.)   Time 6   Period Months   Status On-going   PEDS PT  SHORT TERM GOAL #5   Title Jody [name] will be able to stand at ladder for at least 1 minute 3 out of 5 trials to demonstrate improve strength and balance  to assist with ADL activities with CGA   Baseline Max standing 30 seconds but averages about  20 seconds with minimal assist, AFOs donned. (As of 8//16, Jody [name] was able to stand for 1 minute for the first trial and 25 seconds for the last 2 trials with min A.)   Time 6   Period Months   Status On-going   PEDS PT  SHORT TERM GOAL #6   Title Jody [name] will be able to independently lift her legs onto the mat when transitioning from sit to supine.   Baseline requires moderate assist to lift her legs onto the mat (As of 12/01/14, Jody [name] needed min assist to lift one leg but could independently lift the other leg.)   Time 6   Period Months   Status On-going          Peds PT Long Term Goals - 03/09/15 1726    PEDS PT  LONG TERM GOAL #1   Title Jody [name] will be able to assist with all transfers throughout the day and stand to help improve her ADLs   Time 6   Period Months   Status On-going           Plan - 04/06/15 1707    Clinical Impression Statement Jody [name] fatigues in prone and draws her UE laterally.  Did well to attempt to assume to quadruped when she was repositioning herself on the mat table.     PT plan Began to assess goals.       Problem List There are no active problems to display for this patient.  Dellie BurnsFlavia Keyleen Cerrato, PT 04/06/2015 5:10 PM Phone: [phone] Fax: [phone]  [facility] [address] [place], [place], [address] Phone: [phone]   Fax:  [phone]  Name: Jody [name] MRN: [id number] Date of Birth: 10/27/1995

## 2015-04-13 ENCOUNTER — Encounter: Payer: Self-pay | Admitting: Physical Therapy

## 2015-04-13 ENCOUNTER — Ambulatory Visit: Payer: Medicaid Other | Admitting: Physical Therapy

## 2015-04-13 DIAGNOSIS — M6281 Muscle weakness (generalized): Secondary | ICD-10-CM | POA: Diagnosis not present

## 2015-04-13 DIAGNOSIS — Z7409 Other reduced mobility: Secondary | ICD-10-CM

## 2015-04-13 NOTE — Therapy (Signed)
[facility] [address] [place], [place], [address] Phone: [phone]   Fax:  [phone]  Pediatric Physical Therapy Treatment  Patient Details  Name: Jody [name] MRN: [id number] Date of Birth: 21-Apr-1996 No Data Recorded  Encounter date: 04/13/2015      End of Session - 04/13/15 1625    Visit Number 69   Date for PT Re-Evaluation 05/24/15   Authorization Type Medicaid   Authorization Time Period 12/04/14-05/24/15   Authorization - Visit Number 14   Authorization - Number of Visits 24   PT Start Time 1523   PT Stop Time 1600  Late ramp stuck in parking lot to get out of car.    PT Time Calculation (min) 37 min   Equipment Utilized During Treatment Orthotics   Activity Tolerance Patient tolerated treatment well   Behavior During Therapy Willing to participate      Past Medical History  Diagnosis Date  . Seizures (HCC)   . VP (ventriculoperitoneal) shunt status   . Hydrocephaly     Past Surgical History  Procedure Laterality Date  . Shunt replacement  2008    5th shunt for pt.   . Eye surgery    . Hamstring lengthening    . Tendon lengthening    . Botox injection    . Dental examination under anesthesia      There were no vitals filed for this visit.  Visit Diagnosis:Muscle weakness  Decreased mobility and endurance                    Pediatric PT Treatment - 04/13/15 1617    Subjective Information   Patient Comments Mom reports they were late because the ramp in the [place] got stuck in the parking lot.    PT Pediatric Exercise/Activities   Strengthening Activities Core strengthening sitting on the mat with feet on rocker board.  Tug a war with hula hoop, lateral shifts with moderate assist with LOB to the right. Sitting hip flexion with use of her UE on the mat.     Therapeutic Activities   Therapeutic Activity Details Transfer from w/c to mat with only assist required side  sitting to the right to the left.  Moderate assist required.    Pain   Pain Assessment No/denies pain                 Patient Education - 04/13/15 1625    Education Provided Yes   Education Description Recommended to continue HEP with LE strengthening.    Person(s) Educated Mother;Patient   Method Education Verbal explanation;Discussed session   Comprehension Verbalized understanding          Peds PT Short Term Goals - 03/09/15 1725    PEDS PT  SHORT TERM GOAL #3   Title Dajahnae will be able to  transition from supine to sitting, sitting to supine with minimal assist 2/3 trials. right and left.    Baseline requires moderate assist to the right to left. (As of 12/01/14, Marialuisa required min-mod assist to transition from supine to sitting right to left.)   Time 6   Period Months   Status On-going   PEDS PT  SHORT TERM GOAL #5   Title Rondalyn will be able to stand at ladder for at least 1 minute 3 out of 5 trials to demonstrate improve strength and balance  to assist with ADL activities with CGA   Baseline Max standing 30 seconds but averages about  20 seconds with minimal assist, AFOs donned. (As of 8//16, [name] was able to stand for 1 minute for the first trial and 25 seconds for the last 2 trials with min A.)   Time 6   Period Months   Status On-going   PEDS PT  SHORT TERM GOAL #6   Title [name] will be able to independently lift her legs onto the mat when transitioning from sit to supine.   Baseline requires moderate assist to lift her legs onto the mat (As of 12/01/14, Emonni needed min assist to lift one leg but could independently lift the other leg.)   Time 6   Period Months   Status On-going          Peds PT Long Term Goals - 03/09/15 1726    PEDS PT  LONG TERM GOAL #1   Title [name] will be able to assist with all transfers throughout the day and stand to help improve her ADLs   Time 6   Period Months   Status On-going          Plan - 04/13/15 1626     Clinical Impression Statement Increased difficulty with LOB to the right to left shifts. Moderate trunk shift to the left with activities today.  Seemed she was compensating with decreased strength with LOB to the right.    PT plan Start checking goals.       Problem List There are no active problems to display for this patient.   Dellie BurnsFlavia Marria Mathison, PT 04/13/2015 4:30 PM Phone: [phone] Fax: [phone]  [facility] [address] [place], [place], [address] Phone: [phone]   Fax:  [phone]  Name: [name] Casagrande MRN: [id number] Date of Birth: [birth year]

## 2015-04-20 ENCOUNTER — Ambulatory Visit: Payer: Medicaid Other | Admitting: Physical Therapy

## 2015-04-20 DIAGNOSIS — M6281 Muscle weakness (generalized): Secondary | ICD-10-CM | POA: Diagnosis not present

## 2015-04-20 DIAGNOSIS — Z7409 Other reduced mobility: Secondary | ICD-10-CM

## 2015-04-21 NOTE — Therapy (Signed)
[facility] [address] [place], [place], [address] Phone: [phone]   Fax:  [phone]  Pediatric Physical Therapy Treatment  Patient Details  Name: Jody [name] MRN: [id number] Date of Birth: April 27, 1996 No Data Recorded  Encounter date: 04/20/2015      End of Session - 04/21/15 0904    Visit Number 70   Date for PT Re-Evaluation 05/24/15   Authorization Type Medicaid   Authorization Time Period 12/04/14-05/24/15   Authorization - Visit Number 15   Authorization - Number of Visits 24   PT Start Time 1525   PT Stop Time 1600  10 minutes late   PT Time Calculation (min) 35 min   Equipment Utilized During Treatment Orthotics   Activity Tolerance Patient tolerated treatment well   Behavior During Therapy Willing to participate      Past Medical History  Diagnosis Date  . Seizures (HCC)   . VP (ventriculoperitoneal) shunt status   . Hydrocephaly     Past Surgical History  Procedure Laterality Date  . Shunt replacement  2008    5th shunt for pt.   . Eye surgery    . Hamstring lengthening    . Tendon lengthening    . Botox injection    . Dental examination under anesthesia      There were no vitals filed for this visit.  Visit Diagnosis:Muscle weakness  Decreased mobility and endurance                    Pediatric PT Treatment - 04/21/15 0901    Subjective Information   Patient Comments Mom reports Jody [name] is marching at home with weights on her legs.    PT Pediatric Exercise/Activities   Strengthening Activities Core strengthening sitting on swiss disc with lateral reaching for objects.  SBA-moderate assist.    Therapeutic Activities   Therapeutic Activity Details Transfer w/c to mat with SBA-minimal assist.  Transfer mat to w/c x2 with slide board.    Pain   Pain Assessment No/denies pain                 Patient Education - 04/21/15 0903    Education Provided Yes    Education Description Recommended to continue HEP with LE strengthening.    Person(s) Educated Mother;Patient   Method Education Verbal explanation;Observed session   Comprehension Verbalized understanding          Peds PT Short Term Goals - 03/09/15 1725    PEDS PT  SHORT TERM GOAL #3   Title Jody [name] will be able to  transition from supine to sitting, sitting to supine with minimal assist 2/3 trials. right and left.    Baseline requires moderate assist to the right to left. (As of 12/01/14, Jody [name] required min-mod assist to transition from supine to sitting right to left.)   Time 6   Period Months   Status On-going   PEDS PT  SHORT TERM GOAL #5   Title Jody [name] will be able to stand at ladder for at least 1 minute 3 out of 5 trials to demonstrate improve strength and balance  to assist with ADL activities with CGA   Baseline Max standing 30 seconds but averages about 20 seconds with minimal assist, AFOs donned. (As of 8//16, Jody [name] was able to stand for 1 minute for the first trial and 25 seconds for the last 2 trials with min A.)   Time 6   Period Months   Status On-going  PEDS PT  SHORT TERM GOAL #6   Title Jody [name] will be able to independently lift her legs onto the mat when transitioning from sit to supine.   Baseline requires moderate assist to lift her legs onto the mat (As of 12/01/14, Jody [name] needed min assist to lift one leg but could independently lift the other leg.)   Time 6   Period Months   Status On-going          Peds PT Long Term Goals - 03/09/15 1726    PEDS PT  LONG TERM GOAL #1   Title Jody [name] will be able to assist with all transfers throughout the day and stand to help improve her ADLs   Time 6   Period Months   Status On-going          Plan - 04/21/15 0905    Clinical Impression Statement Moderate difficulty reaching to the right. She would compensate by leaning back and then demonstrating posterior LOB.  Renewal due 1/22   PT plan Check goals.        Problem List There are no active problems to display for this patient.   [name], PT 04/21/2015 9:08 AM Phone: [phone] Fax: [phone]  [facility] [address] [place], [place], [address] Phone: [phone]   Fax:  [phone]  Name: Jody [name] MRN: [id number] Date of Birth: [birth year]

## 2015-05-11 ENCOUNTER — Ambulatory Visit: Payer: Medicaid Other | Admitting: Physical Therapy

## 2015-05-18 ENCOUNTER — Ambulatory Visit: Payer: Medicaid Other | Admitting: Physical Therapy

## 2015-05-25 ENCOUNTER — Ambulatory Visit: Payer: Medicaid Other | Attending: Pediatrics | Admitting: Physical Therapy

## 2015-05-25 DIAGNOSIS — R2681 Unsteadiness on feet: Secondary | ICD-10-CM | POA: Insufficient documentation

## 2015-05-25 DIAGNOSIS — M6281 Muscle weakness (generalized): Secondary | ICD-10-CM | POA: Insufficient documentation

## 2015-05-25 DIAGNOSIS — Z7409 Other reduced mobility: Secondary | ICD-10-CM | POA: Diagnosis present

## 2015-05-26 ENCOUNTER — Encounter: Payer: Self-pay | Admitting: Physical Therapy

## 2015-05-26 NOTE — Therapy (Signed)
[facility] [address] [place], [place], [address] Phone: [phone]   Fax:  [phone]  Pediatric Physical Therapy Treatment  Patient Details  Name: Jody [name] MRN: [id number] Date of Birth: 27-Oct-1995 Referring Provider: Dr. Velvet Bathe  Encounter date: 05/25/2015      End of Session - 05/26/15 1318    Visit Number 71   Date for PT Re-Evaluation 05/24/15   Authorization Type Medicaid   Authorization Time Period 12/04/14-05/24/15   Authorization - Visit Number 16   Authorization - Number of Visits 24   PT Start Time 1525   PT Stop Time 1600  patient late   PT Time Calculation (min) 35 min   Equipment Utilized During Treatment Orthotics   Activity Tolerance Patient tolerated treatment well   Behavior During Therapy Willing to participate      Past Medical History  Diagnosis Date  . Seizures (HCC)   . VP (ventriculoperitoneal) shunt status   . Hydrocephaly     Past Surgical History  Procedure Laterality Date  . Shunt replacement  2008    5th shunt for pt.   . Eye surgery    . Hamstring lengthening    . Tendon lengthening    . Botox injection    . Dental examination under anesthesia      There were no vitals filed for this visit.  Visit Diagnosis:Unsteadiness - Plan: PT plan of care cert/re-cert  Decreased mobility and endurance - Plan: PT plan of care cert/re-cert  Muscle weakness - Plan: PT plan of care cert/re-cert      Pediatric PT Subjective Assessment - 05/26/15 0001    Medical Diagnosis Cerebral Palsy   Referring Provider Dr. Velvet Bathe   Onset Date Birth                      Pediatric PT Treatment - 05/26/15 1258    Subjective Information   Patient Comments Mom expressed difficulty with positioning her properly in the bed at home.    Balance Activities Performed   Balance Details Sitting balance with feet 90-90 degrees external push with SBA-min A LOB to  the right.    Therapeutic Activities   Therapeutic Activity Details Transfer from w/c to mat to sitting. Minimal assist required sideying to sitting from left to right. Moderate cues to use the right UE to assist. Rolling from supine to the left minimal assist, to the right min-moderate assist. Verbal cues to cross midline at chest level vs above head level and to adduct LE. Transition from mat to w/c with slide board x 2 assist.    Pain   Pain Assessment No/denies pain                 Patient Education - 05/26/15 1317    Education Provided Yes   Education Description Discussed current progress and goals   Person(s) Educated Mother;Patient   Method Education Verbal explanation;Observed session   Comprehension Verbalized understanding          Peds PT Short Term Goals - 05/26/15 1319    PEDS PT  SHORT TERM GOAL #1   Title [name] will be able to roll from the right and to the left with SBA to assist with positioning in the bed.    Baseline Min assist to roll to the left, moderate assist to the right    Time 6   Period Months   Status New   PEDS PT  SHORT TERM GOAL #2   Title [name] will be able to reach to the right with control 3/5 trials to grab her seat belt to don for safety without falling over.    Baseline Mom reports Jamicia "flops" over to the right to reach for her seat belt every trial with decreased core control.    Time 6   Period Months   Status New   PEDS PT  SHORT TERM GOAL #3   Title [name] will be able to  transition from supine to sitting, sitting to supine with minimal assist 2/3 trials. right and left.    Baseline . (As of 12/01/14, Azura required min-mod assist to transition from supine to sitting right to left.)   Time 6   Period Months   Status Achieved   PEDS PT  SHORT TERM GOAL #4   Title [name] will be able to improve her sitting balance by accepting challenges in all directions without LOB to demonstrate improved balance   Baseline 4/7 sitting  balance scale (maintains sitting without UE assist > 60 seconds) Emerging to accept challenges but inconsistent.  Increased difficult to the right.    Time 6   Period Months   Status New   PEDS PT  SHORT TERM GOAL #5   Title [name] will be able to stand at ladder for at least 1 minute 3 out of 5 trials to demonstrate improve strength and balance  to assist with ADL activities with CGA   Baseline Max of 60 seconds with fatigue and increase trials more consistent to 30 seconds.    Time 6   Period Months   Status On-going   PEDS PT  SHORT TERM GOAL #6   Title [name] will be able to independently lift her legs onto the mat when transitioning from sit to supine.   Baseline Min A to lift her legs onto the mat to transition on the bed.    Time 6   Period Months   Status On-going          Peds PT Long Term Goals - 05/26/15 1351    PEDS PT  LONG TERM GOAL #1   Title [name] will be able to assist with all transfers throughout the day and stand to help improve her ADLs   Time 6   Period Months   Status On-going          Plan - 05/26/15 1402    Clinical Impression Statement Xaviera is making progress.  She demonstrates increased difficulty to the right with most of her transitions and reaches.  Mom reports [name] is able to transition from w/c to the bed but requires moderate-max assist to positional properly on the pillow.  Mom reports [name] places her feet on her chest and pushes backwards with moderate use of her head digging posteriorly.  She requires assist to roll supine to her side.  [name] will benefit with skilled therapy to addres weakness, assist with transitions in her bed and increase independence with wheelchair activities such as putting on her seat belt without falling over.    Patient will benefit from treatment of the following deficits: Decreased interaction with peers;Decreased ability to perform or assist with self-care;Decreased ability to maintain good postural  alignment;Decreased function at home and in the community;Decreased abililty to observe the enviornment;Decreased sitting balance;Decreased standing balance   Rehab Potential Good   Clinical impairments affecting rehab potential N/A   PT Frequency 1X/week   PT Duration 6 months  PT Treatment/Intervention Gait training;Therapeutic exercises;Therapeutic activities;Self-care and home management;Financial planner;Neuromuscular reeducation;Orthotic fitting and training;Patient/family education;Instruction proper posture/body mechanics   PT plan see updated goals.       Problem List There are no active problems to display for this patient.   [name], PT 05/26/2015 2:18 PM Phone: [phone] Fax: [phone]  [facility] [address] [place], [place], [address] Phone: [phone]   Fax:  [phone]  Name: Cloee Dunwoody MRN: [id number] Date of Birth: [birth year]

## 2015-06-01 ENCOUNTER — Ambulatory Visit: Payer: Medicaid Other | Admitting: Physical Therapy

## 2015-06-08 ENCOUNTER — Ambulatory Visit: Payer: Medicaid Other | Attending: Pediatrics | Admitting: Physical Therapy

## 2015-06-08 DIAGNOSIS — M6281 Muscle weakness (generalized): Secondary | ICD-10-CM | POA: Insufficient documentation

## 2015-06-08 DIAGNOSIS — R2681 Unsteadiness on feet: Secondary | ICD-10-CM | POA: Diagnosis present

## 2015-06-08 DIAGNOSIS — Z7409 Other reduced mobility: Secondary | ICD-10-CM | POA: Insufficient documentation

## 2015-06-10 ENCOUNTER — Encounter: Payer: Self-pay | Admitting: Physical Therapy

## 2015-06-10 NOTE — Therapy (Signed)
[facility] [address] [place], [place], [address] Phone: [phone]   Fax:  [phone]  Pediatric Physical Therapy Treatment  Patient Details  Name: Jody [name] MRN: [id number] Date of Birth: [birth year] Referring Provider: Dr. Velvet Bathe  Encounter date: 06/08/2015      End of Session - 06/10/15 1006    Visit Number 72   Date for PT Re-Evaluation 11/16/15   Authorization Type Medicaid   Authorization Time Period 06/02/15-11/16/15   Authorization - Visit Number 1   Authorization - Number of Visits 24   PT Start Time 1517   PT Stop Time 1600   PT Time Calculation (min) 43 min   Equipment Utilized During Treatment Orthotics   Activity Tolerance Patient tolerated treatment well   Behavior During Therapy Willing to participate      Past Medical History  Diagnosis Date  . Seizures (HCC)   . VP (ventriculoperitoneal) shunt status   . Hydrocephaly     Past Surgical History  Procedure Laterality Date  . Shunt replacement  2008    5th shunt for pt.   . Eye surgery    . Hamstring lengthening    . Tendon lengthening    . Botox injection    . Dental examination under anesthesia      There were no vitals filed for this visit.  Visit Diagnosis:Muscle weakness  Decreased mobility and endurance                    Pediatric PT Treatment - 06/10/15 1002    Subjective Information   Patient Comments [name] and mom report it is difficulty for Jody [name] to assist with bridging to reposition in bed.    PT Pediatric Exercise/Activities   Strengthening Activities Core strengthening tug a war with hula hoop in all direction.  Supine to sit x 10 with min A with hula hoop. Midline cross to reach min A reaching with left hand to right. Reaching down to bench and upright sitting x 12 with SBA-CGA.    Therapeutic Activities   Therapeutic Activity Details Transition from w/c to mat with SBA.  Mat to w/c with  slide board x 2 assist.    Pain   Pain Assessment No/denies pain                 Patient Education - 06/10/15 1006    Education Provided Yes   Education Description Discussed session   Person(s) Educated Mother;Patient   Method Education Verbal explanation;Discussed session   Comprehension Verbalized understanding          Peds PT Short Term Goals - 05/26/15 1319    PEDS PT  SHORT TERM GOAL #1   Title Jody [name] will be able to roll from the right and to the left with SBA to assist with positioning in the bed.    Baseline Min assist to roll to the left, moderate assist to the right    Time 6   Period Months   Status New   PEDS PT  SHORT TERM GOAL #2   Title Jody [name] will be able to reach to the right with control 3/5 trials to grab her seat belt to don for safety without falling over.    Baseline Mom reports Jody [name] "flops" over to the right to reach for her seat belt every trial with decreased core control.    Time 6   Period Months   Status New   PEDS PT  SHORT  TERM GOAL #3   Title Jody [name] will be able to  transition from supine to sitting, sitting to supine with minimal assist 2/3 trials. right and left.    Baseline . (As of 12/01/14, Jody [name] required min-mod assist to transition from supine to sitting right to left.)   Time 6   Period Months   Status Achieved   PEDS PT  SHORT TERM GOAL #4   Title Jody [name] will be able to improve her sitting balance by accepting challenges in all directions without LOB to demonstrate improved balance   Baseline 4/7 sitting balance scale (maintains sitting without UE assist > 60 seconds) Emerging to accept challenges but inconsistent.  Increased difficult to the right.    Time 6   Period Months   Status New   PEDS PT  SHORT TERM GOAL #5   Title Jody [name] will be able to stand at ladder for at least 1 minute 3 out of 5 trials to demonstrate improve strength and balance  to assist with ADL activities with CGA   Baseline Max of 60 seconds with  fatigue and increase trials more consistent to 30 seconds.    Time 6   Period Months   Status On-going   PEDS PT  SHORT TERM GOAL #6   Title Jody [name] will be able to independently lift her legs onto the mat when transitioning from sit to supine.   Baseline Min A to lift her legs onto the mat to transition on the bed.    Time 6   Period Months   Status On-going          Peds PT Long Term Goals - 05/26/15 1351    PEDS PT  LONG TERM GOAL #1   Title Jody [name] will be able to assist with all transfers throughout the day and stand to help improve her ADLs   Time 6   Period Months   Status On-going          Plan - 06/10/15 1008    Clinical Impression Statement Jody [name] and mom report Jody [name] is unable to assist properly with bed positioning task.  May instruct using a sheet to assist with positioning in the bed next session. Great transition from w/c to sit with only SBA.  Mom reports this has improved at home as well.    PT plan Positioning with sheet.       Problem List There are no active problems to display for this patient.   [name], PT 06/10/2015 10:11 AM Phone: [phone] Fax: [phone]  [facility] [address] [place], [place], [address] Phone: [phone]   Fax:  [phone]  Name: Jody [name] MRN: [id number] Date of Birth: 1996/02/12

## 2015-06-15 ENCOUNTER — Ambulatory Visit: Payer: Medicaid Other | Admitting: Physical Therapy

## 2015-06-15 DIAGNOSIS — R2681 Unsteadiness on feet: Secondary | ICD-10-CM

## 2015-06-15 DIAGNOSIS — M6281 Muscle weakness (generalized): Secondary | ICD-10-CM | POA: Diagnosis not present

## 2015-06-15 DIAGNOSIS — Z7409 Other reduced mobility: Secondary | ICD-10-CM

## 2015-06-16 ENCOUNTER — Encounter: Payer: Self-pay | Admitting: Physical Therapy

## 2015-06-17 NOTE — Therapy (Signed)
[facility] [address] [place], [place], [address] Phone: [phone]   Fax:  [phone]  Pediatric Physical Therapy Treatment  Patient Details  Name: Jody [name] MRN: [id number] Date of Birth: 09-17-1995 Referring Provider: Dr. Velvet Bathe  Encounter date: 06/15/2015      End of Session - 06/17/15 0927    Visit Number 73   Date for PT Re-Evaluation 11/16/15   Authorization Type Medicaid   Authorization Time Period 06/02/15-11/16/15   Authorization - Visit Number 2   Authorization - Number of Visits 24   PT Start Time 1518   PT Stop Time 1600   PT Time Calculation (min) 42 min   Equipment Utilized During Treatment Orthotics   Activity Tolerance Patient tolerated treatment well   Behavior During Therapy Willing to participate      Past Medical History  Diagnosis Date  . Seizures (HCC)   . VP (ventriculoperitoneal) shunt status   . Hydrocephaly     Past Surgical History  Procedure Laterality Date  . Shunt replacement  2008    5th shunt for pt.   . Eye surgery    . Hamstring lengthening    . Tendon lengthening    . Botox injection    . Dental examination under anesthesia      There were no vitals filed for this visit.  Visit Diagnosis:Muscle weakness  Decreased mobility and endurance  Unsteadiness                    Pediatric PT Treatment - 06/17/15 0001    Subjective Information   Patient Comments Jody [name] reports she needs to work on getting into her stander again.    PT Pediatric Exercise/Activities   Strengthening Activities Bridging with assist to keep her LE adducted and feet planted on make.  Cues to press into her LE.     Therapeutic Activities   Therapeutic Activity Details Transition from w/c to mat with SBA. Assist required today to transition from sidelying to sit. Transition from mat to w/c x 2 with slide board. attempted to transition supine to reposition on  pillow up with sheet to see if able to achieve for home transfers.    ROM   Knee Extension(hamstrings) Supine with PROM of LE hamstrings.    Pain   Pain Assessment FLACC  3/10 with PROM of hamstrings.                  Patient Education - 06/17/15 0926    Education Provided Yes   Education Description Discussed options to assist with bed transfer positions, highly encouraged to use stander at home.    Person(s) Educated Mother;Patient   Method Education Verbal explanation;Discussed session   Comprehension Verbalized understanding          Peds PT Short Term Goals - 05/26/15 1319    PEDS PT  SHORT TERM GOAL #1   Title Jody [name] will be able to roll from the right and to the left with SBA to assist with positioning in the bed.    Baseline Min assist to roll to the left, moderate assist to the right    Time 6   Period Months   Status New   PEDS PT  SHORT TERM GOAL #2   Title Jody [name] will be able to reach to the right with control 3/5 trials to grab her seat belt to don for safety without falling over.    Baseline Mom reports Miley "flops"  over to the right to reach for her seat belt every trial with decreased core control.    Time 6   Period Months   Status New   PEDS PT  SHORT TERM GOAL #3   Title Jody [name] will be able to  transition from supine to sitting, sitting to supine with minimal assist 2/3 trials. right and left.    Baseline . (As of 12/01/14, Modest required min-mod assist to transition from supine to sitting right to left.)   Time 6   Period Months   Status Achieved   PEDS PT  SHORT TERM GOAL #4   Title Jody [name] will be able to improve her sitting balance by accepting challenges in all directions without LOB to demonstrate improved balance   Baseline 4/7 sitting balance scale (maintains sitting without UE assist > 60 seconds) Emerging to accept challenges but inconsistent.  Increased difficult to the right.    Time 6   Period Months   Status New   PEDS PT  SHORT  TERM GOAL #5   Title Jody [name] will be able to stand at ladder for at least 1 minute 3 out of 5 trials to demonstrate improve strength and balance  to assist with ADL activities with CGA   Baseline Max of 60 seconds with fatigue and increase trials more consistent to 30 seconds.    Time 6   Period Months   Status On-going   PEDS PT  SHORT TERM GOAL #6   Title Jody [name] will be able to independently lift her legs onto the mat when transitioning from sit to supine.   Baseline Min A to lift her legs onto the mat to transition on the bed.    Time 6   Period Months   Status On-going          Peds PT Long Term Goals - 05/26/15 1351    PEDS PT  LONG TERM GOAL #1   Title Jody [name] will be able to assist with all transfers throughout the day and stand to help improve her ADLs   Time 6   Period Months   Status On-going          Plan - 06/17/15 0931    Clinical Impression Statement Due to weight of patient and decrease strength in her LE, unable to be successful with sheet transfer x 1 assist.  Jody [name] reports  x2 assist will not always be available at home.  Recommended to start transfer from w/c-bed from the pillow head region.  Mom repots she would have to rearrange the equipment in her room to make this happen. No room to have a lift placed to assist per mom. Jody [name] is able to complete a pelvic tilt with bridging activity with about 1" max move up in supine with assist to keep her knees adducted.    PT plan Continue with bed mobility activities.       Problem List There are no active problems to display for this patient.  Jody [name], PT 06/17/2015 9:36 AM Phone: [phone] Fax: [phone]  [facility] [address] [place], [place], [address] Phone: [phone]   Fax:  [phone]  Name: Jody [name] MRN: [id number] Date of Birth: July 13, 1995

## 2015-06-22 ENCOUNTER — Ambulatory Visit: Payer: Medicaid Other | Admitting: Physical Therapy

## 2015-06-22 DIAGNOSIS — R2681 Unsteadiness on feet: Secondary | ICD-10-CM

## 2015-06-22 DIAGNOSIS — M6281 Muscle weakness (generalized): Secondary | ICD-10-CM | POA: Diagnosis not present

## 2015-06-23 ENCOUNTER — Encounter: Payer: Self-pay | Admitting: Physical Therapy

## 2015-06-23 NOTE — Therapy (Signed)
[facility] [address] [place], [place], [address] Phone: [phone]   Fax:  [phone]  Pediatric Physical Therapy Treatment  Patient Details  Name: Jody [name] MRN: [id number] Date of Birth: 01/30/1996 Referring Provider: Dr. Velvet Bathe  Encounter date: 06/22/2015      End of Session - 06/23/15 1305    Visit Number 74   Date for PT Re-Evaluation 11/16/15   Authorization Type Medicaid   Authorization Time Period 06/02/15-11/16/15   Authorization - Visit Number 3   Authorization - Number of Visits 24   PT Start Time 1520   PT Stop Time 1600   PT Time Calculation (min) 40 min   Equipment Utilized During Treatment Orthotics   Activity Tolerance Patient tolerated treatment well   Behavior During Therapy Willing to participate      Past Medical History  Diagnosis Date  . Seizures (HCC)   . VP (ventriculoperitoneal) shunt status   . Hydrocephaly     Past Surgical History  Procedure Laterality Date  . Shunt replacement  2008    5th shunt for pt.   . Eye surgery    . Hamstring lengthening    . Tendon lengthening    . Botox injection    . Dental examination under anesthesia      There were no vitals filed for this visit.  Visit Diagnosis:Muscle weakness  Unsteadiness                    Pediatric PT Treatment - 06/23/15 1259    Subjective Information   Patient Comments Jessa and mom report they need to work more on the stander at home.    Balance Activities Performed   Balance Details Midline cross to reaching with fishing rod x 5 each side. Feet on rocker board to challenge balance with SBA cues to keep left forearm off mat from right to left cross. Beam bag toss anterior to basketball cues to throw higher which challenges core. Noodle hold ball bounce back with cues to hold UE at chest level.    Therapeutic Activities   Therapeutic Activity Details Transition from w/c to mat  with SBA.  Mat to w/c x 2 with slide board.                  Patient Education - 06/23/15 1304    Education Provided Yes   Education Description Increase use of stander at home.    Person(s) Educated Mother;Patient   Method Education Verbal explanation;Discussed session;Observed session   Comprehension Verbalized understanding          Peds PT Short Term Goals - 05/26/15 1319    PEDS PT  SHORT TERM GOAL #1   Title Konnie will be able to roll from the right and to the left with SBA to assist with positioning in the bed.    Baseline Min assist to roll to the left, moderate assist to the right    Time 6   Period Months   Status New   PEDS PT  SHORT TERM GOAL #2   Title Ameliya will be able to reach to the right with control 3/5 trials to grab her seat belt to don for safety without falling over.    Baseline Mom reports Saher "flops" over to the right to reach for her seat belt every trial with decreased core control.    Time 6   Period Months   Status New   PEDS PT  SHORT TERM GOAL #3   Title Ineta will be able to  transition from supine to sitting, sitting to supine with minimal assist 2/3 trials. right and left.    Baseline . (As of 12/01/14, Shanica required min-mod assist to transition from supine to sitting right to left.)   Time 6   Period Months   Status Achieved   PEDS PT  SHORT TERM GOAL #4   Title Iisha will be able to improve her sitting balance by accepting challenges in all directions without LOB to demonstrate improved balance   Baseline 4/7 sitting balance scale (maintains sitting without UE assist > 60 seconds) Emerging to accept challenges but inconsistent.  Increased difficult to the right.    Time 6   Period Months   Status New   PEDS PT  SHORT TERM GOAL #5   Title Christol will be able to stand at ladder for at least 1 minute 3 out of 5 trials to demonstrate improve strength and balance  to assist with ADL activities with CGA   Baseline Max of 60  seconds with fatigue and increase trials more consistent to 30 seconds.    Time 6   Period Months   Status On-going   PEDS PT  SHORT TERM GOAL #6   Title Luvina will be able to independently lift her legs onto the mat when transitioning from sit to supine.   Baseline Min A to lift her legs onto the mat to transition on the bed.    Time 6   Period Months   Status On-going          Peds PT Long Term Goals - 05/26/15 1351    PEDS PT  LONG TERM GOAL #1   Title Ghadeer will be able to assist with all transfers throughout the day and stand to help improve her ADLs   Time 6   Period Months   Status On-going          Plan - 06/23/15 1305    Clinical Impression Statement Tavon was able to transition without assist from w/c to mat with SBA.  Mom reports she has noticed improvments with this at home.  Slight use of forearm with midline cross from right to left.    PT plan Bed mobiltiy, rolling on mat side to side.       Problem List There are no active problems to display for this patient.   [name], PT 06/23/2015 1:07 PM Phone: [phone] Fax: [phone]  [facility] [address] [place], [place], [address] Phone: [phone]   Fax:  [phone]  Name: Jody [name] MRN: [id number] Date of Birth: 04-04-1996

## 2015-06-29 ENCOUNTER — Encounter (HOSPITAL_COMMUNITY): Payer: Self-pay | Admitting: Emergency Medicine

## 2015-06-29 ENCOUNTER — Ambulatory Visit: Payer: Medicaid Other | Admitting: Physical Therapy

## 2015-06-29 ENCOUNTER — Emergency Department (HOSPITAL_COMMUNITY)
Admission: EM | Admit: 2015-06-29 | Discharge: 2015-06-30 | Disposition: A | Discharge status: Home / Self Care | Payer: Medicaid Other | Attending: Emergency Medicine | Admitting: Emergency Medicine

## 2015-06-29 DIAGNOSIS — Z8669 Personal history of other diseases of the nervous system and sense organs: Secondary | ICD-10-CM | POA: Diagnosis not present

## 2015-06-29 DIAGNOSIS — Z7952 Long term (current) use of systemic steroids: Secondary | ICD-10-CM | POA: Insufficient documentation

## 2015-06-29 DIAGNOSIS — Z79899 Other long term (current) drug therapy: Secondary | ICD-10-CM | POA: Diagnosis not present

## 2015-06-29 DIAGNOSIS — L732 Hidradenitis suppurativa: Secondary | ICD-10-CM | POA: Diagnosis not present

## 2015-06-29 DIAGNOSIS — Z792 Long term (current) use of antibiotics: Secondary | ICD-10-CM | POA: Diagnosis not present

## 2015-06-29 DIAGNOSIS — L988 Other specified disorders of the skin and subcutaneous tissue: Secondary | ICD-10-CM | POA: Diagnosis present

## 2015-06-29 DIAGNOSIS — M6281 Muscle weakness (generalized): Secondary | ICD-10-CM | POA: Diagnosis not present

## 2015-06-29 HISTORY — DX: Hidradenitis suppurativa: L73.2

## 2015-06-29 NOTE — ED Notes (Addendum)
Pt presents c/o bilateral wounds in armpits with bleeding on the left, hx of hydradenitis with similar s/s.  Pain rated 8/10 when palpated.  No additional complaints or concerns.

## 2015-06-29 NOTE — ED Provider Notes (Signed)
CSN: [id number]     Arrival date & time 06/29/15  1623 History  By signing my name below, I, Tanda Rockers, attest that this documentation has been prepared under the direction and in the presence of Gilda Crease, MD. Electronically Signed: Tanda Rockers, ED Scribe. 06/29/2015. 11:25 PM.   Chief Complaint  Patient presents with  . Recurrent Skin Infections   The history is provided by the patient and a parent. No language interpreter was used.     HPI Comments: Jody [name] is a 20 y.o. female who presents to the Emergency Department complaining of gradual onset, constant, itchy and painful wounds to bilateral axilla, left greater than right, x a few months, worsening today. Mom states that the areas started as pinholes but has been gradually increased in size. Mom reports that pt has been seen by provider in [place] last year for these wounds. She was first placed on Clindamycin lotion and Doxycycline without relief. Pt was then placed on Clindamycin tablets and Proscar and was told the wound should clear up within 3 months. Pt has been on the medications for the past 5 months without any changes. Mom mentions that the wound on the left axilla has been bleeding more profusely and draining white "stuff" for the past 3 days. This morning the bleeding was much worse, prompting mom to call provider in [facility] who can only see pt next week. Pt has hx of hydradenitis with similar symptoms. Denies any other associated symptoms.    Past Medical History  Diagnosis Date  . Seizures (HCC)   . VP (ventriculoperitoneal) shunt status   . Hydrocephaly   . Hydradenitis     bilateral   Past Surgical History  Procedure Laterality Date  . Shunt replacement  2008    5th shunt for pt.   . Eye surgery    . Hamstring lengthening    . Tendon lengthening    . Botox injection    . Dental examination under anesthesia     No family history on file. Social History  Substance Use Topics  .  Smoking status: Never Smoker   . Smokeless tobacco: None  . Alcohol Use: No   OB History    No data available     Review of Systems  Constitutional: Negative for fever.  Skin: Positive for wound (Bilateral axilla).  All other systems reviewed and are negative.  Allergies  Ceftibuten; Fentanyl; and Tape  Home Medications   Prior to Admission medications   Medication Sig Start Date End Date Taking? Authorizing Provider  baclofen (LIORESAL) 10 MG tablet Take 5-15 mg by mouth 2 (two) times daily. 5 mg in the morning and 15 mg at night   Yes Historical Provider, MD  Cholecalciferol (VITAMIN D) 400 UNITS capsule Take 400 Units by mouth daily.   Yes Historical Provider, MD  clindamycin (CLEOCIN) 300 MG capsule Take 300 mg by mouth 2 (two) times daily. 06/18/15  Yes Historical Provider, MD  cyclobenzaprine (FLEXERIL) 5 MG tablet Take 5 mg by mouth every 8 (eight) hours. For 7 days. 06/05/15  Yes Historical Provider, MD  diazepam (DIASTAT ACUDIAL) 20 MG GEL Place 15 mg rectally once as needed (seizures).   Yes Historical Provider, MD  finasteride (PROSCAR) 5 MG tablet Take 5 mg by mouth daily. 06/18/15  Yes Historical Provider, MD  fluconazole (DIFLUCAN) 100 MG tablet Take 200 mg by mouth as directed. TAKE 2 TABLETS (200 MG) AS NEEDED FOR YEAST INFECTIONS. 08/20/14  Yes  Historical Provider, MD  fluocinolone (DERMA-SMOOTHE) 0.01 % external oil Apply 1 application topically 2 (two) times daily.   Yes Historical Provider, MD  ibuprofen (ADVIL,MOTRIN) 800 MG tablet Take 800 mg by mouth every 8 (eight) hours as needed for pain.   Yes Historical Provider, MD  Multiple Vitamin (MULTIVITAMIN WITH MINERALS) TABS tablet Take 1 tablet by mouth daily.   Yes Historical Provider, MD  ondansetron (ZOFRAN-ODT) 4 MG disintegrating tablet Take 1 tablet (4 mg total) by mouth every 8 (eight) hours as needed for nausea or vomiting. 12/03/14  Yes [name], MD  polyethylene glycol powder (GLYCOLAX/MIRALAX) powder  Take 17 g by mouth 2 (two) times daily. 12/03/14  Yes [name], MD  propranolol (INDERAL) 20 MG tablet Take 20 mg by mouth 2 (two) times daily.   Yes Historical Provider, MD  rizatriptan (MAXALT) 10 MG tablet Take 10 mg by mouth as needed for migraine. May repeat in 2 hours if needed   Yes Historical Provider, MD  zonisamide (ZONEGRAN) 100 MG capsule Take 300 mg by mouth at bedtime.    Yes Historical Provider, MD  docusate sodium (COLACE) 100 MG capsule Take 1 capsule (100 mg total) by mouth every 12 (twelve) hours. Patient not taking: Reported on 06/29/2015 12/03/14   [name], MD   BP 127/84 mmHg  Pulse 96  Temp(Src) 97.8 F (36.6 C) (Oral)  Resp 18  SpO2 100%  LMP 06/06/2015 (Approximate)   Physical Exam  Constitutional: She is oriented to person, place, and time. She appears well-developed and well-nourished. No distress.  HENT:  Head: Normocephalic and atraumatic.  Right Ear: Hearing normal.  Left Ear: Hearing normal.  Nose: Nose normal.  Mouth/Throat: Oropharynx is clear and moist and mucous membranes are normal.  Eyes: Conjunctivae and EOM are normal. Pupils are equal, round, and reactive to light.  Neck: Normal range of motion. Neck supple.  Cardiovascular: Regular rhythm, S1 normal and S2 normal.  Exam reveals no gallop and no friction rub.   No murmur heard. Pulmonary/Chest: Effort normal and breath sounds normal. No respiratory distress. She exhibits no tenderness.  Abdominal: Soft. Normal appearance and bowel sounds are normal. There is no hepatosplenomegaly. There is no tenderness. There is no rebound, no guarding, no tenderness at McBurney's point and negative Murphy's sign. No hernia.  Musculoskeletal: Normal range of motion.  Neurological: She is alert and oriented to person, place, and time. She has normal strength. No cranial nerve deficit or sensory deficit. Coordination normal. GCS eye subscore is 4. GCS verbal subscore is 5. GCS motor subscore is 6.   Skin: Skin is warm, dry and intact. No rash noted. No cyanosis.  5 mm deep opening in the anterior portion of left axilla without any erythema, induration, drainage, or bleeding  Psychiatric: She has a normal mood and affect. Her speech is normal and behavior is normal. Thought content normal.  Nursing note and vitals reviewed.   ED Course  Procedures (including critical care time)  DIAGNOSTIC STUDIES: Oxygen Saturation is 100% on RA, normal by my interpretation.    COORDINATION OF CARE: 11:18 PM-Discussed treatment plan which includes dressing changes with pt/mother at bedside and pt/mother agreed to plan.   Labs Review Labs Reviewed - No data to display  Imaging Review No results found.    EKG Interpretation None      MDM   Final diagnoses:  Hidradenitis suppurativa   Patient presents to the emergency department for evaluation of bleeding from her left axilla. Patient  has a previous diagnosis of hidradenitis support fever. She is seeing a specialist at [facility]. Patient has been on clindamycin for 3 months. She has a follow-up appointment scheduled in one week. Patient reports that the area occasionally bleeds, but today there was a large amount of blood draining from the wound and it looks bigger. Prior to that chunky white material came out of the wound. Examination reveals a 5 mm opening that appears to be a tract into the axilla. There is no sign of infection at this time. There is no active bleeding. Instructed on direct pressure for further bleeding. Will treat topically with Silvadene.  I personally performed the services described in this documentation, which was scribed in my presence. The recorded information has been reviewed and is accurate.      Gilda Crease, MD 06/30/15 [phone]

## 2015-06-30 ENCOUNTER — Encounter: Payer: Self-pay | Admitting: Physical Therapy

## 2015-06-30 MED ORDER — SILVER SULFADIAZINE 1 % EX CREA
TOPICAL_CREAM | Freq: Two times a day (BID) | CUTANEOUS | Status: DC
  Administered 2015-06-30: 1 via TOPICAL
  Filled 2015-06-30: qty 50

## 2015-06-30 NOTE — Discharge Instructions (Signed)

## 2015-06-30 NOTE — Therapy (Signed)
[facility] [address] [place], [place], [address] Phone: [phone]   Fax:  [phone]  Pediatric Physical Therapy Treatment  Patient Details  Name: Jody [name] MRN: [id number] Date of Birth: [birth year] Referring Provider: Dr. Velvet Bathe  Encounter date: 06/29/2015      End of Session - 06/30/15 1223    Visit Number 75   Date for PT Re-Evaluation 11/16/15   Authorization Type Medicaid   Authorization Time Period 06/02/15-11/16/15   Authorization - Visit Number 4   Authorization - Number of Visits 24   PT Start Time 1520   PT Stop Time 1600   PT Time Calculation (min) 40 min   Equipment Utilized During Treatment Orthotics   Activity Tolerance Patient tolerated treatment well   Behavior During Therapy Willing to participate      Past Medical History  Diagnosis Date  . Seizures (HCC)   . VP (ventriculoperitoneal) shunt status   . Hydrocephaly   . Hydradenitis     bilateral    Past Surgical History  Procedure Laterality Date  . Shunt replacement  2008    5th shunt for pt.   . Eye surgery    . Hamstring lengthening    . Tendon lengthening    . Botox injection    . Dental examination under anesthesia      There were no vitals filed for this visit.  Visit Diagnosis:Muscle weakness                    Pediatric PT Treatment - 06/30/15 1215    Subjective Information   Patient Comments Jody [name] sore under her armpit was bleeding today and mom deciding if she should take her to ER after treatment today    PT Pediatric Exercise/Activities   Strengthening Activities Rolling from supine to sidelying bilateral directions x 12 each side. min-moderate assist from supine to left side lying. V/c to cross right UE to left. Core strength sitting edge of mat with cues to increase weight bearing to the left.    Therapeutic Activities   Therapeutic Activity Details transition from w/c to supine  with Supervision. Minimal assist to position her properly on the mat table. Transiton to w/c from mat with slide board x 2 max assist.    Pain   Pain Assessment No/denies pain                 Patient Education - 06/30/15 1223    Education Provided Yes   Education Description supine to sidelying practice at home   Person(s) Educated Mother;Patient   Method Education Verbal explanation;Discussed session;Observed session   Comprehension Verbalized understanding          Peds PT Short Term Goals - 05/26/15 1319    PEDS PT  SHORT TERM GOAL #1   Title Jody [name] will be able to roll from the right and to the left with SBA to assist with positioning in the bed.    Baseline Min assist to roll to the left, moderate assist to the right    Time 6   Period Months   Status New   PEDS PT  SHORT TERM GOAL #2   Title Jody [name] will be able to reach to the right with control 3/5 trials to grab her seat belt to don for safety without falling over.    Baseline Mom reports Jody [name] "flops" over to the right to reach for her seat belt every trial with decreased core control.  Time 6   Period Months   Status New   PEDS PT  SHORT TERM GOAL #3   Title Jody [name] will be able to  transition from supine to sitting, sitting to supine with minimal assist 2/3 trials. right and left.    Baseline . (As of 12/01/14, Jody [name] required min-mod assist to transition from supine to sitting right to left.)   Time 6   Period Months   Status Achieved   PEDS PT  SHORT TERM GOAL #4   Title Jody [name] will be able to improve her sitting balance by accepting challenges in all directions without LOB to demonstrate improved balance   Baseline 4/7 sitting balance scale (maintains sitting without UE assist > 60 seconds) Emerging to accept challenges but inconsistent.  Increased difficult to the right.    Time 6   Period Months   Status New   PEDS PT  SHORT TERM GOAL #5   Title Jody [name] will be able to stand at ladder for at least 1  minute 3 out of 5 trials to demonstrate improve strength and balance  to assist with ADL activities with CGA   Baseline Max of 60 seconds with fatigue and increase trials more consistent to 30 seconds.    Time 6   Period Months   Status On-going   PEDS PT  SHORT TERM GOAL #6   Title Jody [name] will be able to independently lift her legs onto the mat when transitioning from sit to supine.   Baseline Min A to lift her legs onto the mat to transition on the bed.    Time 6   Period Months   Status On-going          Peds PT Long Term Goals - 05/26/15 1351    PEDS PT  LONG TERM GOAL #1   Title Jody [name] will be able to assist with all transfers throughout the day and stand to help improve her ADLs   Time 6   Period Months   Status On-going          Plan - 06/30/15 1224    Clinical Impression Statement Avoid left arm for transitions today due to sore in armpit.  Mom reports MD in [place] to assess next week but may bring her to ER.  Difficulty to cross midline from supine to the left with decrease use of right side to assist with transitions. Fatigue noted about 1/2 way through to the right but did not require assist all trials.  Assist only slight-min A.    PT plan Bed mobilty activities.       Problem List There are no active problems to display for this patient.  Jody [name], PT 06/30/2015 12:26 PM Phone: [phone] Fax: [phone]  [facility] [address] [place], [place], [address] Phone: [phone]   Fax:  [phone]  Name: Jody [name] MRN: [id number] Date of Birth: [birth year]

## 2015-07-06 ENCOUNTER — Ambulatory Visit: Payer: Medicaid Other | Attending: Pediatrics

## 2015-07-06 ENCOUNTER — Ambulatory Visit: Payer: Medicaid Other | Admitting: Physical Therapy

## 2015-07-06 DIAGNOSIS — Z7409 Other reduced mobility: Secondary | ICD-10-CM

## 2015-07-06 DIAGNOSIS — M6281 Muscle weakness (generalized): Secondary | ICD-10-CM | POA: Diagnosis present

## 2015-07-06 DIAGNOSIS — R2681 Unsteadiness on feet: Secondary | ICD-10-CM

## 2015-07-07 NOTE — Therapy (Signed)
[facility] [address] [place], [place], [address] Phone: [phone]   Fax:  [phone]  Pediatric Physical Therapy Treatment  Patient Details  Name: Jody [name] MRN: [id number] Date of Birth: Apr 08, 1996 Referring Provider: Dr. Velvet Bathe  Encounter date: 07/06/2015      End of Session - 07/07/15 1129    Visit Number 76   Date for PT Re-Evaluation 11/16/15   Authorization Type Medicaid   Authorization Time Period 06/02/15-11/16/15   Authorization - Visit Number 5   Authorization - Number of Visits 24   PT Start Time 1517   PT Stop Time 1600   PT Time Calculation (min) 43 min   Equipment Utilized During Treatment Orthotics   Activity Tolerance Patient tolerated treatment well   Behavior During Therapy Willing to participate      Past Medical History  Diagnosis Date  . Seizures (HCC)   . VP (ventriculoperitoneal) shunt status   . Hydrocephaly   . Hydradenitis     bilateral    Past Surgical History  Procedure Laterality Date  . Shunt replacement  2008    5th shunt for pt.   . Eye surgery    . Hamstring lengthening    . Tendon lengthening    . Botox injection    . Dental examination under anesthesia      There were no vitals filed for this visit.  Visit Diagnosis:Unsteadiness  Decreased mobility and endurance  Muscle weakness                    Pediatric PT Treatment - 07/06/15 1700    Subjective Information   Patient Comments Jonell said that she was excited to stay and work in her chair today   PT Pediatric Exercise/Activities   Strengthening Activities Worked on LE therex in chair this session. Hip add ball squeeze 3x10 with cues to hold and squeexe for 2-3 sec each time. Hip marching BLE 2x10 with cues to increase knee height with march. Limited LAQs while kicking drum 2x5 on each LE with cues to control descent of LEs.   Therapeutic Activities   Therapeutic Activity  Details Worked on kicking over cones from seated position in chair. Movements started off ataxic but Inis able to work on smoothing out movement and making kicks sharper. Brytni tossed ball back and forth x15.    ROM   Knee Extension(hamstrings) Placed LEs into long sit position from the chair and maintain 2x5 mins for prolongs hamstring and knee extension stretch. Manual facilitation to work on IR rotation with stretching   Pain   Pain Assessment No/denies pain                 Patient Education - 07/07/15 1128    Education Provided Yes   Education Description To work on LE therex practiced today during session   Person(s) Educated Patient   Method Education Verbal explanation;Demonstration   Comprehension Returned demonstration          Peds PT Short Term Goals - 05/26/15 1319    PEDS PT  SHORT TERM GOAL #1   Title Valma will be able to roll from the right and to the left with SBA to assist with positioning in the bed.    Baseline Min assist to roll to the left, moderate assist to the right    Time 6   Period Months   Status New   PEDS PT  SHORT TERM GOAL #2  Title [name] will be able to reach to the right with control 3/5 trials to grab her seat belt to don for safety without falling over.    Baseline Mom reports [name] "flops" over to the right to reach for her seat belt every trial with decreased core control.    Time 6   Period Months   Status New   PEDS PT  SHORT TERM GOAL #3   Title [name] will be able to  transition from supine to sitting, sitting to supine with minimal assist 2/3 trials. right and left.    Baseline . (As of 12/01/14, Nicle required min-mod assist to transition from supine to sitting right to left.)   Time 6   Period Months   Status Achieved   PEDS PT  SHORT TERM GOAL #4   Title [name] will be able to improve her sitting balance by accepting challenges in all directions without LOB to demonstrate improved balance   Baseline 4/7 sitting  balance scale (maintains sitting without UE assist > 60 seconds) Emerging to accept challenges but inconsistent.  Increased difficult to the right.    Time 6   Period Months   Status New   PEDS PT  SHORT TERM GOAL #5   Title [name] will be able to stand at ladder for at least 1 minute 3 out of 5 trials to demonstrate improve strength and balance  to assist with ADL activities with CGA   Baseline Max of 60 seconds with fatigue and increase trials more consistent to 30 seconds.    Time 6   Period Months   Status On-going   PEDS PT  SHORT TERM GOAL #6   Title [name] will be able to independently lift her legs onto the mat when transitioning from sit to supine.   Baseline Min A to lift her legs onto the mat to transition on the bed.    Time 6   Period Months   Status On-going          Peds PT Long Term Goals - 05/26/15 1351    PEDS PT  LONG TERM GOAL #1   Title [name] will be able to assist with all transfers throughout the day and stand to help improve her ADLs   Time 6   Period Months   Status On-going          Plan - 07/07/15 1129    Clinical Impression Statement Worked on strengthening and ROM in chair today. [name] unable to get full knee extension with hamstring stretch and stated that she does not keep KI on at home for night stretching bc she doesn't sleep well with them on and needs good sleep to be able to work the next day. She worked very hard with LE therex today.    PT plan Bed mobility and ROM      Problem List There are no active problems to display for this patient.   Fredrich BirksRobinette, Samwise Eckardt Elizabeth 07/07/2015, 11:31 AM  [facility] [address] [place], [place], [address] Phone: [phone]   Fax:  [phone]  Name: Jody [name] MRN: [id number] Date of Birth: 11/30/1995 07/07/2015 Fredrich Birksobinette, Stefano Trulson Elizabeth PTA

## 2015-07-13 ENCOUNTER — Ambulatory Visit: Payer: Medicaid Other

## 2015-07-13 DIAGNOSIS — R2681 Unsteadiness on feet: Secondary | ICD-10-CM | POA: Diagnosis not present

## 2015-07-13 DIAGNOSIS — M6281 Muscle weakness (generalized): Secondary | ICD-10-CM

## 2015-07-13 DIAGNOSIS — Z7409 Other reduced mobility: Secondary | ICD-10-CM

## 2015-07-14 NOTE — Therapy (Signed)
[facility] [address] [place], [place], [address] Phone: [phone]   Fax:  [phone]  Pediatric Physical Therapy Treatment  Patient Details  Name: Jody [name] MRN: [id number] Date of Birth: [birth year] Referring Provider: Dr. Velvet BathePamela Warner  Encounter date: 07/13/2015      End of Session - 07/14/15 0912    Visit Number 77   Date for PT Re-Evaluation 11/16/15   Authorization Type Medicaid   Authorization Time Period 06/02/15-11/16/15   Authorization - Visit Number 6   Authorization - Number of Visits 24   PT Start Time 1520   PT Stop Time 1600   PT Time Calculation (min) 40 min   Equipment Utilized During Treatment Orthotics   Activity Tolerance Patient tolerated treatment well   Behavior During Therapy Willing to participate      Past Medical History  Diagnosis Date  . Seizures (HCC)   . VP (ventriculoperitoneal) shunt status   . Hydrocephaly   . Hydradenitis     bilateral    Past Surgical History  Procedure Laterality Date  . Shunt replacement  2008    5th shunt for pt.   . Eye surgery    . Hamstring lengthening    . Tendon lengthening    . Botox injection    . Dental examination under anesthesia      There were no vitals filed for this visit.  Visit Diagnosis:Muscle weakness  Decreased mobility and endurance  Unsteadiness                    Pediatric PT Treatment - 07/13/15 1600    Subjective Information   Patient Comments Jody [name] stated that she was feeling good today and that she had a good weekend   PT Pediatric Exercise/Activities   Strengthening Activities Rolling from supine to sidelying x2 in each directions with min A for positioning of LEs. Sidelying to sit to the L x3 with mod A to facilitate full upright sitting. Worked on propping from L to R elbow back to midline posture x6 on each side with more effort noted to push up from the R side with L hand pulling  from bottom of the mat to sit up fully. Core strengthening sitting EOM with cues for upriht posture and positioning. Tends to lean to the R side requring cues to lean to the L. Sit ups with occasional min A x15 with green wedge behind back for assistance. Transferred back to chair with slide board and +2 max assist for positioning   ROM   Knee Extension(hamstrings) Supine PROM 2x30 each LE   Pain   Pain Assessment No/denies pain                 Patient Education - 07/14/15 0911    Education Provided Yes   Education Description To continue working on rolling to each side and practicing at home   Person(s) Educated Patient   Method Education Verbal explanation;Demonstration   Comprehension Returned demonstration          Peds PT Short Term Goals - 05/26/15 1319    PEDS PT  SHORT TERM GOAL #1   Title Jody [name] will be able to roll from the right and to the left with SBA to assist with positioning in the bed.    Baseline Min assist to roll to the left, moderate assist to the right    Time 6   Period Months   Status New   PEDS  PT  SHORT TERM GOAL #2   Title Jody [name] will be able to reach to the right with control 3/5 trials to grab her seat belt to don for safety without falling over.    Baseline Mom reports Jody [name] "flops" over to the right to reach for her seat belt every trial with decreased core control.    Time 6   Period Months   Status New   PEDS PT  SHORT TERM GOAL #3   Title Jody [name] will be able to  transition from supine to sitting, sitting to supine with minimal assist 2/3 trials. right and left.    Baseline . (As of 12/01/14, Aileen required min-mod assist to transition from supine to sitting right to left.)   Time 6   Period Months   Status Achieved   PEDS PT  SHORT TERM GOAL #4   Title Jody [name] will be able to improve her sitting balance by accepting challenges in all directions without LOB to demonstrate improved balance   Baseline 4/7 sitting balance scale (maintains  sitting without UE assist > 60 seconds) Emerging to accept challenges but inconsistent.  Increased difficult to the right.    Time 6   Period Months   Status New   PEDS PT  SHORT TERM GOAL #5   Title Jody [name] will be able to stand at ladder for at least 1 minute 3 out of 5 trials to demonstrate improve strength and balance  to assist with ADL activities with CGA   Baseline Max of 60 seconds with fatigue and increase trials more consistent to 30 seconds.    Time 6   Period Months   Status On-going   PEDS PT  SHORT TERM GOAL #6   Title Jody [name] will be able to independently lift her legs onto the mat when transitioning from sit to supine.   Baseline Min A to lift her legs onto the mat to transition on the bed.    Time 6   Period Months   Status On-going          Peds PT Long Term Goals - 05/26/15 1351    PEDS PT  LONG TERM GOAL #1   Title Jody [name] will be able to assist with all transfers throughout the day and stand to help improve her ADLs   Time 6   Period Months   Status On-going          Plan - 07/14/15 0913    Clinical Impression Statement Worked on core strengthening and rolling/side to sit this session. Noted increased difficult coming to sit from the R side.    PT plan Bed mobility and ROM      Problem List There are no active problems to display for this patient.   Fredrich Birks 07/14/2015, 9:14 AM  [facility] [address] [place], [place], [address] Phone: [phone]   Fax:  [phone]  Name: Jody [name] MRN: [id number] Date of Birth: 1996-04-26 07/14/2015 Fredrich Birks PTA

## 2015-07-20 ENCOUNTER — Ambulatory Visit: Payer: Medicaid Other | Admitting: Physical Therapy

## 2015-07-20 DIAGNOSIS — R2681 Unsteadiness on feet: Secondary | ICD-10-CM | POA: Diagnosis not present

## 2015-07-20 DIAGNOSIS — M6281 Muscle weakness (generalized): Secondary | ICD-10-CM

## 2015-07-21 ENCOUNTER — Encounter: Payer: Self-pay | Admitting: Physical Therapy

## 2015-07-21 NOTE — Therapy (Signed)
[facility] [address] [place], [place], [address] Phone: [phone]   Fax:  [phone]  Pediatric Physical Therapy Treatment  Patient Details  Name: Jody [name] MRN: [id number] Date of Birth: Jul 06, 1995 Referring Provider: Dr. Velvet BathePamela Warner  Encounter date: 07/20/2015      End of Session - 07/21/15 1009    Visit Number 78   Date for PT Re-Evaluation 11/16/15   Authorization Type Medicaid   Authorization Time Period 06/02/15-11/16/15   Authorization - Visit Number 7   Authorization - Number of Visits 24   PT Start Time 1522   PT Stop Time 1600   PT Time Calculation (min) 38 min   Equipment Utilized During Treatment Orthotics   Activity Tolerance Patient tolerated treatment well   Behavior During Therapy Willing to participate      Past Medical History  Diagnosis Date  . Seizures (HCC)   . VP (ventriculoperitoneal) shunt status   . Hydrocephaly   . Hydradenitis     bilateral    Past Surgical History  Procedure Laterality Date  . Shunt replacement  2008    5th shunt for pt.   . Eye surgery    . Hamstring lengthening    . Tendon lengthening    . Botox injection    . Dental examination under anesthesia      There were no vitals filed for this visit.  Visit Diagnosis:Muscle weakness  Unsteadiness                    Pediatric PT Treatment - 07/21/15 1004    Subjective Information   Patient Comments Jody [name] reported she had laser under her arms to help heal her open wounds.    PT Pediatric Exercise/Activities   Strengthening Activities Sitting on edge of w/c seat in between stance with cues to achieve midline sitting posture.    Weight Bearing Activities   Weight Bearing Activities Sit to stand from w/c to webwall cues to place hands on high rings to assist with stance.  Min-moderate assist to maintain weight bearing stance. Moderate cues to place weight through her extremities and  to extend her hips. Repeated 4 times max held 30 seconds x 1.    Seated Stepper   Other Endurance Exercise/Activities UBE level 2.0, 10 minutes.   Pain   Pain Assessment No/denies pain                 Patient Education - 07/21/15 1009    Education Provided Yes   Education Description Mom observed session to achieve carryover at home   Person(s) Educated Mother   Method Education Verbal explanation;Observed session   Comprehension Returned demonstration          Peds PT Short Term Goals - 05/26/15 1319    PEDS PT  SHORT TERM GOAL #1   Title Jody [name] will be able to roll from the right and to the left with SBA to assist with positioning in the bed.    Baseline Min assist to roll to the left, moderate assist to the right    Time 6   Period Months   Status New   PEDS PT  SHORT TERM GOAL #2   Title Jody [name] will be able to reach to the right with control 3/5 trials to grab her seat belt to don for safety without falling over.    Baseline Mom reports Jody [name] "flops" over to the right to reach for her seat belt every  trial with decreased core control.    Time 6   Period Months   Status New   PEDS PT  SHORT TERM GOAL #3   Title Jody [name] will be able to  transition from supine to sitting, sitting to supine with minimal assist 2/3 trials. right and left.    Baseline . (As of 12/01/14, Demetric required min-mod assist to transition from supine to sitting right to left.)   Time 6   Period Months   Status Achieved   PEDS PT  SHORT TERM GOAL #4   Title Jody [name] will be able to improve her sitting balance by accepting challenges in all directions without LOB to demonstrate improved balance   Baseline 4/7 sitting balance scale (maintains sitting without UE assist > 60 seconds) Emerging to accept challenges but inconsistent.  Increased difficult to the right.    Time 6   Period Months   Status New   PEDS PT  SHORT TERM GOAL #5   Title Jody [name] will be able to stand at ladder for at least 1  minute 3 out of 5 trials to demonstrate improve strength and balance  to assist with ADL activities with CGA   Baseline Max of 60 seconds with fatigue and increase trials more consistent to 30 seconds.    Time 6   Period Months   Status On-going   PEDS PT  SHORT TERM GOAL #6   Title Jody [name] will be able to independently lift her legs onto the mat when transitioning from sit to supine.   Baseline Min A to lift her legs onto the mat to transition on the bed.    Time 6   Period Months   Status On-going          Peds PT Long Term Goals - 05/26/15 1351    PEDS PT  LONG TERM GOAL #1   Title Jody [name] will be able to assist with all transfers throughout the day and stand to help improve her ADLs   Time 6   Period Months   Status On-going          Plan - 07/21/15 1010    Clinical Impression Statement Min-moderate assist to scoot back into chair.  Increased knee flexion noted with stance today. Moderate lean to the right after session with moderate cues to sit in midline.    PT plan Continue with weight bearing activities.       Problem List There are no active problems to display for this patient.   [name], PT 07/21/2015 10:14 AM Phone: [phone] Fax: [phone]  [facility] [address] [place], [place], [address] Phone: [phone]   Fax:  [phone]  Name: Jody [name] MRN: [id number] Date of Birth: 06-09-1995

## 2015-07-27 ENCOUNTER — Ambulatory Visit: Payer: Medicaid Other | Admitting: Physical Therapy

## 2015-08-03 ENCOUNTER — Ambulatory Visit: Payer: Medicaid Other | Attending: Pediatrics | Admitting: Physical Therapy

## 2015-08-03 DIAGNOSIS — Z7409 Other reduced mobility: Secondary | ICD-10-CM | POA: Diagnosis present

## 2015-08-03 DIAGNOSIS — M6281 Muscle weakness (generalized): Secondary | ICD-10-CM | POA: Diagnosis present

## 2015-08-04 ENCOUNTER — Encounter: Payer: Self-pay | Admitting: Physical Therapy

## 2015-08-04 NOTE — Therapy (Signed)
[facility] [address] [place], [place], [address] Phone: [phone]   Fax:  [phone]  Pediatric Physical Therapy Treatment  Patient Details  Name: Jody [name] MRN: [id number] Date of Birth: 1996-04-25 Referring Provider: Dr. Velvet Bathe  Encounter date: 08/03/2015      End of Session - 08/04/15 2214    Visit Number 79   Date for PT Re-Evaluation 11/16/15   Authorization Type Medicaid   Authorization Time Period 06/02/15-11/16/15   Authorization - Visit Number 8   Authorization - Number of Visits 24   PT Start Time 1515   PT Stop Time 1600   PT Time Calculation (min) 45 min   Equipment Utilized During Treatment Orthotics   Activity Tolerance Patient tolerated treatment well   Behavior During Therapy Willing to participate      Past Medical History  Diagnosis Date  . Seizures (HCC)   . VP (ventriculoperitoneal) shunt status   . Hydrocephaly   . Hydradenitis     bilateral    Past Surgical History  Procedure Laterality Date  . Shunt replacement  2008    5th shunt for pt.   . Eye surgery    . Hamstring lengthening    . Tendon lengthening    . Botox injection    . Dental examination under anesthesia      There were no vitals filed for this visit.  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    PT Pediatric Exercise/Activities   Strengthening Activities Sitting on edge of mat without LE support under feet with midline cross to retrieve SBA cues to remain in midline.  Trunk strengthening rolling from supine to left side to supine to right side. Min-Mod A to roll supine to the left.    Pain   Pain Assessment No/denies pain                 Patient Education - 08/04/15 2214     Education Provided Yes   Education Description practice rolling from supine to left sidelying.    Person(s) Educated Mother;Patient   Method Education Verbal explanation;Observed session   Comprehension Returned demonstration          [employer] PT Short Term Goals - 05/26/15 1319    PEDS PT  SHORT TERM GOAL #1   Title Chabely will be able to roll from the right and to the left with SBA to assist with positioning in the bed.    Baseline Min assist to roll to the left, moderate assist to the right    Time 6   Period Months   Status New   PEDS PT  SHORT TERM GOAL #2   Title Jody [name] will be able to reach to the right with control 3/5 trials to grab her seat belt to don for safety without falling over.    Baseline Mom reports Jody [name] "flops" over to the right to reach for her seat belt every trial with decreased core control.    Time 6   Period Months   Status New   PEDS PT  SHORT TERM GOAL #3   Title Jody [name] will be able to  transition from supine to sitting, sitting to supine with  minimal assist 2/3 trials. right and left.    Baseline . (As of 12/01/14, Jody [name] required min-mod assist to transition from supine to sitting right to left.)   Time 6   Period Months   Status Achieved   PEDS PT  SHORT TERM GOAL #4   Title Jody [name] will be able to improve her sitting balance by accepting challenges in all directions without LOB to demonstrate improved balance   Baseline 4/7 sitting balance scale (maintains sitting without UE assist > 60 seconds) Emerging to accept challenges but inconsistent.  Increased difficult to the right.    Time 6   Period Months   Status New   PEDS PT  SHORT TERM GOAL #5   Title Jody [name] will be able to stand at ladder for at least 1 minute 3 out of 5 trials to demonstrate improve strength and balance  to assist with ADL activities with CGA   Baseline Max of 60 seconds with fatigue and increase trials more consistent to 30 seconds.    Time 6   Period Months   Status On-going    PEDS PT  SHORT TERM GOAL #6   Title Jody [name] will be able to independently lift her legs onto the mat when transitioning from sit to supine.   Baseline Min A to lift her legs onto the mat to transition on the bed.    Time 6   Period Months   Status On-going          Peds PT Long Term Goals - 05/26/15 1351    PEDS PT  LONG TERM GOAL #1   Title Jody [name] will be able to assist with all transfers throughout the day and stand to help improve her ADLs   Time 6   Period Months   Status On-going          Plan - 08/04/15 2215    Clinical Impression Statement Difficulty to rotate trunk in sitting. Jody [name] preferred to recline back to achieve the reaching to the left and right. Required assist to roll from supine to left sidelying.    PT plan continue to work on goals.       Problem List There are no active problems to display for this patient.  Dellie BurnsFlavia Ellington Cornia, PT 08/04/2015 10:17 PM Phone: [phone] Fax: [phone]  [facility] [address] [place], [place], [address] Phone: [phone]   Fax:  [phone]  Name: Jody [name] MRN: [id number] Date of Birth: [birth year]

## 2015-08-10 ENCOUNTER — Ambulatory Visit: Payer: Medicaid Other

## 2015-08-17 ENCOUNTER — Ambulatory Visit: Payer: Medicaid Other | Admitting: Physical Therapy

## 2015-08-24 ENCOUNTER — Ambulatory Visit: Payer: Medicaid Other | Admitting: Physical Therapy

## 2015-08-31 ENCOUNTER — Ambulatory Visit: Payer: Medicaid Other | Attending: Pediatrics | Admitting: Physical Therapy

## 2015-08-31 DIAGNOSIS — M6281 Muscle weakness (generalized): Secondary | ICD-10-CM | POA: Diagnosis present

## 2015-08-31 DIAGNOSIS — Z7409 Other reduced mobility: Secondary | ICD-10-CM | POA: Diagnosis present

## 2015-09-01 ENCOUNTER — Encounter: Payer: Self-pay | Admitting: Physical Therapy

## 2015-09-01 NOTE — Therapy (Signed)
[facility] [address] [place], [place], [address] Phone: [phone]   Fax:  [phone]  Pediatric Physical Therapy Treatment  Patient Details  Name: Jody [name] MRN: [id number] Date of Birth: [birth year] Referring Provider: Dr. Velvet Bathe  Encounter date: 08/31/2015      End of Session - 09/01/15 0919    Visit Number 80   Date for PT Re-Evaluation 11/16/15   Authorization Type Medicaid   Authorization Time Period 06/02/15-11/16/15   Authorization - Visit Number 9   Authorization - Number of Visits 24   PT Start Time 1520   PT Stop Time 1600   PT Time Calculation (min) 40 min   Equipment Utilized During Treatment Orthotics   Activity Tolerance Patient tolerated treatment well   Behavior During Therapy Willing to participate      Past Medical History  Diagnosis Date  . Seizures (HCC)   . VP (ventriculoperitoneal) shunt status   . Hydrocephaly   . Hydradenitis     bilateral    Past Surgical History  Procedure Laterality Date  . Shunt replacement  2008    5th shunt for pt.   . Eye surgery    . Hamstring lengthening    . Tendon lengthening    . Botox injection    . Dental examination under anesthesia      There were no vitals filed for this visit.                    Pediatric PT Treatment - 09/01/15 0908    Subjective Information   Patient Comments Mom apologized for missed appointments since she was sick.    PT Pediatric Exercise/Activities   Strengthening Activities Rolling supine to sidelying. Mod-min A supine to the  to the left. Cues to bring UE/LE over and rotate at pelvis.  SBA- min A at end of activity to the right. Trunk strengthening with lateral reach to the right and back to center with feet on bench 90-90 degrees posture.     Therapeutic Activities   Therapeutic Activity Details w/c to mat transfer with SBA.  Only assist to assume posture in the middle of the mat.   Slide board transfer x2 back into w/c.    Pain   Pain Assessment No/denies pain                   Peds PT Short Term Goals - 05/26/15 1319    PEDS PT  SHORT TERM GOAL #1   Title Jody [name] will be able to roll from the right and to the left with SBA to assist with positioning in the bed.    Baseline Min assist to roll to the left, moderate assist to the right    Time 6   Period Months   Status New   PEDS PT  SHORT TERM GOAL #2   Title Jody [name] will be able to reach to the right with control 3/5 trials to grab her seat belt to don for safety without falling over.    Baseline Mom reports Jody [name] "flops" over to the right to reach for her seat belt every trial with decreased core control.    Time 6   Period Months   Status New   PEDS PT  SHORT TERM GOAL #3   Title Jody [name] will be able to  transition from supine to sitting, sitting to supine with minimal assist 2/3 trials. right and left.    Baseline . (As  of 12/01/14, Jody [name] required min-mod assist to transition from supine to sitting right to left.)   Time 6   Period Months   Status Achieved   PEDS PT  SHORT TERM GOAL #4   Title Jody [name] will be able to improve her sitting balance by accepting challenges in all directions without LOB to demonstrate improved balance   Baseline 4/7 sitting balance scale (maintains sitting without UE assist > 60 seconds) Emerging to accept challenges but inconsistent.  Increased difficult to the right.    Time 6   Period Months   Status New   PEDS PT  SHORT TERM GOAL #5   Title Jody [name] will be able to stand at ladder for at least 1 minute 3 out of 5 trials to demonstrate improve strength and balance  to assist with ADL activities with CGA   Baseline Max of 60 seconds with fatigue and increase trials more consistent to 30 seconds.    Time 6   Period Months   Status On-going   PEDS PT  SHORT TERM GOAL #6   Title Jody [name] will be able to independently lift her legs onto the mat when transitioning from sit  to supine.   Baseline Min A to lift her legs onto the mat to transition on the bed.    Time 6   Period Months   Status On-going          Peds PT Long Term Goals - 05/26/15 1351    PEDS PT  LONG TERM GOAL #1   Title Jody [name] will be able to assist with all transfers throughout the day and stand to help improve her ADLs   Time 6   Period Months   Status On-going          Plan - 09/01/15 0920    Clinical Impression Statement Required min A several attempts to roll to the left when she activated her trunk and pelvis to assist.  Fatigue noted when rolling  to the right. Mom did not feel like Jody [name] would be able to attend a make up session other than Mondays due to work. This PT out on the 15th and holiday on 29th.    PT plan Continue with trunk strengthening.       Patient will benefit from skilled therapeutic intervention in order to improve the following deficits and impairments:  Decreased interaction with peers, Decreased ability to perform or assist with self-care, Decreased ability to maintain good postural alignment, Decreased function at home and in the community, Decreased abililty to observe the enviornment, Decreased sitting balance, Decreased standing balance  Visit Diagnosis: Muscle weakness  Decreased mobility and endurance   Problem List There are no active problems to display for this patient.  Dellie BurnsFlavia Ebelin Dillehay, PT 09/01/2015 9:23 AM Phone: [phone] Fax: [phone]  [facility] [address] [place], [place], [address] Phone: [phone]   Fax:  [phone]  Name: Jody [name] MRN: [id number] Date of Birth: 1995-05-12

## 2015-09-05 ENCOUNTER — Emergency Department (HOSPITAL_COMMUNITY)
Admission: EM | Admit: 2015-09-05 | Discharge: 2015-09-05 | Disposition: A | Discharge status: Home / Self Care | Payer: Medicaid Other | Attending: Emergency Medicine | Admitting: Emergency Medicine

## 2015-09-05 ENCOUNTER — Encounter (HOSPITAL_COMMUNITY): Payer: Self-pay

## 2015-09-05 ENCOUNTER — Emergency Department (HOSPITAL_COMMUNITY): Payer: Medicaid Other

## 2015-09-05 DIAGNOSIS — R569 Unspecified convulsions: Secondary | ICD-10-CM | POA: Diagnosis present

## 2015-09-05 DIAGNOSIS — Z872 Personal history of diseases of the skin and subcutaneous tissue: Secondary | ICD-10-CM | POA: Insufficient documentation

## 2015-09-05 DIAGNOSIS — Z7952 Long term (current) use of systemic steroids: Secondary | ICD-10-CM | POA: Insufficient documentation

## 2015-09-05 DIAGNOSIS — Z79899 Other long term (current) drug therapy: Secondary | ICD-10-CM | POA: Diagnosis not present

## 2015-09-05 DIAGNOSIS — Z792 Long term (current) use of antibiotics: Secondary | ICD-10-CM | POA: Insufficient documentation

## 2015-09-05 DIAGNOSIS — Z982 Presence of cerebrospinal fluid drainage device: Secondary | ICD-10-CM | POA: Diagnosis not present

## 2015-09-05 DIAGNOSIS — G40909 Epilepsy, unspecified, not intractable, without status epilepticus: Secondary | ICD-10-CM | POA: Diagnosis not present

## 2015-09-05 LAB — CBG MONITORING, ED: Glucose-Capillary: 78 mg/dL (ref 65–99)

## 2015-09-05 NOTE — Discharge Instructions (Signed)
Follow-up with your doctor to have your seizure medication adjusted if needed Epilepsy Epilepsy is a disorder in which a person has repeated seizures over time. A seizure is a release of abnormal electrical activity in the brain. Seizures can cause a change in attention, behavior, or the ability to remain awake and alert (altered mental status). Seizures often involve uncontrollable shaking (convulsions).  Most people with epilepsy lead normal lives. However, people with epilepsy are at an increased risk of falls, accidents, and injuries. Therefore, it is important to begin treatment right away. CAUSES  Epilepsy has many possible causes. Anything that disturbs the normal pattern of brain cell activity can lead to seizures. This may include:   Head injury.  Birth trauma.  High fever as a child.  Stroke.  Bleeding into or around the brain.  Certain drugs.  Prolonged low oxygen, such as what occurs after CPR efforts.  Abnormal brain development.  Certain illnesses, such as meningitis, encephalitis (brain infection), malaria, and other infections.  An imbalance of nerve signaling chemicals (neurotransmitters).  SIGNS AND SYMPTOMS  The symptoms of a seizure can vary greatly from one person to another. Right before a seizure, you may have a warning (aura) that a seizure is about to occur. An aura may include the following symptoms:  Fear or anxiety.  Nausea.  Feeling like the room is spinning (vertigo).  Vision changes, such as seeing flashing lights or spots. Common symptoms during a seizure include:  Abnormal sensations, such as an abnormal smell or a bitter taste in the mouth.   Sudden, general body stiffness.   Convulsions that involve rhythmic jerking of the face, arm, or leg on one or both sides.   Sudden change in consciousness.   Appearing to be awake but not responding.   Appearing to be asleep but cannot be awakened.   Grimacing, chewing, lip smacking,  drooling, tongue biting, or loss of bowel or bladder control. After a seizure, you may feel sleepy for a while. DIAGNOSIS  Your health care provider will ask about your symptoms and take a medical history. Descriptions from any witnesses to your seizures will be very helpful in the diagnosis. A physical exam, including a detailed neurological exam, is necessary. Various tests may be done, such as:   An electroencephalogram (EEG). This is a painless test of your brain waves. In this test, a diagram is created of your brain waves. These diagrams can be interpreted by a specialist.  An MRI of the brain.   A CT scan of the brain.   A spinal tap (lumbar puncture, LP).  Blood tests to check for signs of infection or abnormal blood chemistry. TREATMENT  There is no cure for epilepsy, but it is generally treatable. Once epilepsy is diagnosed, it is important to begin treatment as soon as possible. For most people with epilepsy, seizures can be controlled with medicines. The following may also be used:  A pacemaker for the brain (vagus nerve stimulator) can be used for people with seizures that are not well controlled by medicine.  Surgery on the brain. For some people, epilepsy eventually goes away. HOME CARE INSTRUCTIONS   Follow your health care provider's recommendations on driving and safety in normal activities.  Get enough rest. Lack of sleep can cause seizures.  Only take over-the-counter or prescription medicines as directed by your health care provider. Take any prescribed medicine exactly as directed.  Avoid any known triggers of your seizures.  Keep a seizure diary. Record what  you recall about any seizure, especially any possible trigger.   Make sure the people you live and work with know that you are prone to seizures. They should receive instructions on how to help you. In general, a witness to a seizure should:   Cushion your head and body.   Turn you on your side.    Avoid unnecessarily restraining you.   Not place anything inside your mouth.   Call for emergency medical help if there is any question about what has occurred.   Follow up with your health care provider as directed. You may need regular blood tests to monitor the levels of your medicine.  SEEK MEDICAL CARE IF:   You develop signs of infection or other illness. This might increase the risk of a seizure.   You seem to be having more frequent seizures.   Your seizure pattern is changing.  SEEK IMMEDIATE MEDICAL CARE IF:   You have a seizure that does not stop after a few moments.   You have a seizure that causes any difficulty in breathing.   You have a seizure that results in a very severe headache.   You have a seizure that leaves you with the inability to speak or use a part of your body.    This information is not intended to replace advice given to you by your health care provider. Make sure you discuss any questions you have with your health care provider.   Document Released: 04/18/2005 Document Revised: 02/06/2013 Document Reviewed: 11/28/2012 Elsevier Interactive Patient Education [employer]2016 [employer]vier Inc.

## 2015-09-05 NOTE — ED Notes (Signed)
Bed: WA03 Expected date:  Expected time:  Means of arrival:  Comments: 73F seizure

## 2015-09-05 NOTE — ED Notes (Signed)
CBG 78. 

## 2015-09-05 NOTE — ED Notes (Signed)
According to EMS, [facility] a [facility] EMS pt was not post-ictal on scene. Pt arrives A+OX4, speaking in complete sentences, does not recall seizure activity.

## 2015-09-05 NOTE — ED Provider Notes (Signed)
CSN: [id number]     Arrival date & time 09/05/15  1947 History   First MD Initiated Contact with Patient 09/05/15 2016     Chief Complaint  Patient presents with  . Seizures     (Consider location/radiation/quality/duration/timing/severity/associated sxs/prior Treatment) HPI Comments: Patient here after having a witnessed seizure by her mother. Seizure was described as her usual abscence type. No recent changes to her seizure medication. States that the seizure was precipitated by the back on a speaker at church and that she has had similar episodes to this in the past and this is no different. She denies any recent illnesses. No recent fever, vomiting. Child is back to her baseline but mother is concerned that took about 15 minutes to return to normal.  Patient is a 20 y.o. female presenting with seizures. The history is provided by the patient and a parent.  Seizures   Past Medical History  Diagnosis Date  . Seizures (HCC)   . VP (ventriculoperitoneal) shunt status   . Hydrocephaly   . Hydradenitis     bilateral   Past Surgical History  Procedure Laterality Date  . Shunt replacement  2008    5th shunt for pt.   . Eye surgery    . Hamstring lengthening    . Tendon lengthening    . Botox injection    . Dental examination under anesthesia     History reviewed. No pertinent family history. Social History  Substance Use Topics  . Smoking status: Never Smoker   . Smokeless tobacco: None  . Alcohol Use: No   OB History    No data available     Review of Systems  Neurological: Positive for seizures.  All other systems reviewed and are negative.     Allergies  Ceftibuten; Fentanyl; and Tape  Home Medications   Prior to Admission medications   Medication Sig Start Date End Date Taking? Authorizing Provider  baclofen (LIORESAL) 10 MG tablet Take 5-15 mg by mouth 2 (two) times daily. 5 mg in the morning and 15 mg at night   Yes Historical Provider, MD  Cholecalciferol  (VITAMIN D) 400 UNITS capsule Take 400 Units by mouth daily.   Yes Historical Provider, MD  clindamycin (CLEOCIN T) 1 % lotion Apply 1 application topically 2 (two) times daily.   Yes Historical Provider, MD  clindamycin (CLEOCIN) 300 MG capsule Take 300 mg by mouth 2 (two) times daily. 06/18/15  Yes Historical Provider, MD  cyclobenzaprine (FLEXERIL) 5 MG tablet Take 5 mg by mouth every 8 (eight) hours. For 7 days. 06/05/15  Yes Historical Provider, MD  diazepam (DIASTAT ACUDIAL) 20 MG GEL Place 15 mg rectally once as needed (seizures).   Yes Historical Provider, MD  finasteride (PROSCAR) 5 MG tablet Take 5 mg by mouth daily. 06/18/15  Yes Historical Provider, MD  fluconazole (DIFLUCAN) 100 MG tablet Take 200 mg by mouth as directed. TAKE 2 TABLETS (200 MG) AS NEEDED FOR YEAST INFECTIONS. 08/20/14  Yes Historical Provider, MD  fluocinolone (DERMA-SMOOTHE) 0.01 % external oil Apply 1 application topically 2 (two) times daily.   Yes Historical Provider, MD  ibuprofen (ADVIL,MOTRIN) 800 MG tablet Take 800 mg by mouth every 8 (eight) hours as needed for headache, mild pain or moderate pain.    Yes Historical Provider, MD  Multiple Vitamin (MULTIVITAMIN WITH MINERALS) TABS tablet Take 1 tablet by mouth daily.   Yes Historical Provider, MD  ondansetron (ZOFRAN-ODT) 4 MG disintegrating tablet Take 1 tablet (4 mg  total) by mouth every 8 (eight) hours as needed for nausea or vomiting. 12/03/14  Yes Benjiman CoreNathan Pickering, MD  polyethylene glycol powder (GLYCOLAX/MIRALAX) powder Take 17 g by mouth 2 (two) times daily. 12/03/14  Yes Benjiman CoreNathan Pickering, MD  propranolol (INDERAL) 20 MG tablet Take 20 mg by mouth 2 (two) times daily.   Yes Historical Provider, MD  rizatriptan (MAXALT) 10 MG tablet Take 10 mg by mouth as needed for migraine. May repeat in 2 hours if needed   Yes Historical Provider, MD  zonisamide (ZONEGRAN) 100 MG capsule Take 300 mg by mouth 2 (two) times daily.    Yes Historical Provider, MD  docusate sodium  (COLACE) 100 MG capsule Take 1 capsule (100 mg total) by mouth every 12 (twelve) hours. Patient not taking: Reported on 06/29/2015 12/03/14   Benjiman CoreNathan Pickering, MD   BP 139/86 mmHg  Pulse 90  Temp(Src) 97.7 F (36.5 C) (Oral)  Resp 20  SpO2 100% Physical Exam  Constitutional: She is oriented to person, place, and time. She appears well-developed and well-nourished.  Non-toxic appearance. No distress.  HENT:  Head: Normocephalic and atraumatic.  Eyes: Conjunctivae, EOM and lids are normal. Pupils are equal, round, and reactive to light.  Neck: Normal range of motion. Neck supple. No tracheal deviation present. No thyroid mass present.  Cardiovascular: Normal rate, regular rhythm and normal heart sounds.  Exam reveals no gallop.   No murmur heard. Pulmonary/Chest: Effort normal and breath sounds normal. No stridor. No respiratory distress. She has no decreased breath sounds. She has no wheezes. She has no rhonchi. She has no rales.  Abdominal: Soft. Normal appearance and bowel sounds are normal. She exhibits no distension. There is no tenderness. There is no rebound and no CVA tenderness.  Musculoskeletal: Normal range of motion. She exhibits no edema or tenderness.  Neurological: She is alert and oriented to person, place, and time. No cranial nerve deficit or sensory deficit. GCS eye subscore is 4. GCS verbal subscore is 5. GCS motor subscore is 6.  Skin: Skin is warm and dry. No abrasion and no rash noted.  Psychiatric: Her affect is blunt. Her speech is delayed. She is slowed.  Nursing note and vitals reviewed.   ED Course  Procedures (including critical care time) Labs Review Labs Reviewed  CBG MONITORING, ED    Imaging Review No results found. I have personally reviewed and evaluated these images and lab results as part of my medical decision-making.   EKG Interpretation None      MDM   Final diagnoses:  None    Patient not her neurological baseline at this time. CT  scan results reviewed. Patient's hydrocephalus is unchanged from prior She has noted focal neurological findings according to her mother. Will be discharged home.    Lorre NickAnthony Tamiyah Moulin, MD 09/05/15 2216

## 2015-09-07 ENCOUNTER — Ambulatory Visit: Payer: Medicaid Other | Admitting: Physical Therapy

## 2015-09-07 DIAGNOSIS — M6281 Muscle weakness (generalized): Secondary | ICD-10-CM

## 2015-09-07 DIAGNOSIS — Z7409 Other reduced mobility: Secondary | ICD-10-CM

## 2015-09-08 ENCOUNTER — Encounter: Payer: Self-pay | Admitting: Physical Therapy

## 2015-09-08 NOTE — Therapy (Signed)
[facility] [place], [place], [address] Phone: [phone]   Fax:  [phone]  Pediatric Physical Therapy Treatment  Patient Details  Name: Jody [name] MRN: [id number] Date of Birth: Aug 17, 1995 Referring Provider: Dr. Velvet Bathe  Encounter date: 09/07/2015      End of Session - 09/08/15 0854    Visit Number 81   Date for PT Re-Evaluation 11/16/15   Authorization Type Medicaid   Authorization Time Period 06/02/15-11/16/15   Authorization - Visit Number 10   Authorization - Number of Visits 24   PT Start Time 1520   PT Stop Time 1600   PT Time Calculation (min) 40 min   Equipment Utilized During Treatment Orthotics   Activity Tolerance Patient tolerated treatment well   Behavior During Therapy Willing to participate      Past Medical History  Diagnosis Date  . Seizures (HCC)   . VP (ventriculoperitoneal) shunt status   . Hydrocephaly   . Hydradenitis     bilateral    Past Surgical History  Procedure Laterality Date  . Shunt replacement  2008    5th shunt for pt.   . Eye surgery    . Hamstring lengthening    . Tendon lengthening    . Botox injection    . Dental examination under anesthesia      There were no vitals filed for this visit.                    Pediatric PT Treatment - 09/08/15 0847    Subjective Information   Patient Comments Mom reports Bobby had a seizure Saturday evening.    PT Pediatric Exercise/Activities   Strengthening Activities Core strengthening reaching laterally and crossing midline 50% of the time with LE 90-90 degree on bench. Anterior reach down and back  to midline All with SBA.  Required assist to assume sitting from supine position transitioning on the mat.    Therapeutic Activities   Therapeutic Activity Details w/c to mat SBA- min A to transition to sit. Mat to w/c slide board transfer x 2 max to moderate assist.    Pain   Pain  Assessment No/denies pain                 Patient Education - 09/08/15 0853    Education Provided Yes   Education Description discussed session with mom. Notified PT out next session.    Person(s) Educated Mother;Patient   Method Education Verbal explanation;Discussed session   Comprehension Verbalized understanding          Peds PT Short Term Goals - 05/26/15 1319    PEDS PT  SHORT TERM GOAL #1   Title Cory will be able to roll from the right and to the left with SBA to assist with positioning in the bed.    Baseline Min assist to roll to the left, moderate assist to the right    Time 6   Period Months   Status New   PEDS PT  SHORT TERM GOAL #2   Title Anjanette will be able to reach to the right with control 3/5 trials to grab her seat belt to don for safety without falling over.    Baseline Mom reports Fatma "flops" over to the right to reach for her seat belt every trial with decreased core control.    Time 6   Period Months   Status New   PEDS PT  SHORT TERM GOAL #  3   Title [name] will be able to  transition from supine to sitting, sitting to supine with minimal assist 2/3 trials. right and left.    Baseline . (As of 12/01/14, Addalynn required min-mod assist to transition from supine to sitting right to left.)   Time 6   Period Months   Status Achieved   PEDS PT  SHORT TERM GOAL #4   Title [name] will be able to improve her sitting balance by accepting challenges in all directions without LOB to demonstrate improved balance   Baseline 4/7 sitting balance scale (maintains sitting without UE assist > 60 seconds) Emerging to accept challenges but inconsistent.  Increased difficult to the right.    Time 6   Period Months   Status New   PEDS PT  SHORT TERM GOAL #5   Title [name] will be able to stand at ladder for at least 1 minute 3 out of 5 trials to demonstrate improve strength and balance  to assist with ADL activities with CGA   Baseline Max of 60 seconds with  fatigue and increase trials more consistent to 30 seconds.    Time 6   Period Months   Status On-going   PEDS PT  SHORT TERM GOAL #6   Title [name] will be able to independently lift her legs onto the mat when transitioning from sit to supine.   Baseline Min A to lift her legs onto the mat to transition on the bed.    Time 6   Period Months   Status On-going          Peds PT Long Term Goals - 05/26/15 1351    PEDS PT  LONG TERM GOAL #1   Title [name] will be able to assist with all transfers throughout the day and stand to help improve her ADLs   Time 6   Period Months   Status On-going          Plan - 09/08/15 0855    Clinical Impression Statement Seizure on Saturday.  Stayed at hospital for couple hours for shunt assessment. Not fully self today, fatigued.  Increased trunk lean both sides noted in w/c but would correct when cued.    PT plan trunk strengthening.       Patient will benefit from skilled therapeutic intervention in order to improve the following deficits and impairments:  Decreased interaction with peers, Decreased ability to perform or assist with self-care, Decreased ability to maintain good postural alignment, Decreased function at home and in the community, Decreased abililty to observe the enviornment, Decreased sitting balance, Decreased standing balance  Visit Diagnosis: Muscle weakness  Decreased mobility and endurance   Problem List There are no active problems to display for this patient.   Dellie BurnsFlavia Seydina Holliman, PT 09/08/2015 8:58 AM Phone: [phone] Fax: [phone]  [facility] [address] [place], [place], [address] Phone: [phone]   Fax:  [phone]  Name: Jody [name] MRN: [id number] Date of Birth: [birth year]

## 2015-09-14 ENCOUNTER — Ambulatory Visit: Payer: Medicaid Other | Admitting: Physical Therapy

## 2015-09-21 ENCOUNTER — Ambulatory Visit: Payer: Medicaid Other | Admitting: Physical Therapy

## 2015-10-05 ENCOUNTER — Ambulatory Visit: Payer: Medicaid Other | Attending: Pediatrics | Admitting: Physical Therapy

## 2015-10-05 DIAGNOSIS — Z7409 Other reduced mobility: Secondary | ICD-10-CM | POA: Diagnosis present

## 2015-10-05 DIAGNOSIS — M6281 Muscle weakness (generalized): Secondary | ICD-10-CM | POA: Insufficient documentation

## 2015-10-07 ENCOUNTER — Encounter: Payer: Self-pay | Admitting: Physical Therapy

## 2015-10-07 NOTE — Therapy (Signed)
[facility] [place], [place], [address] Phone: [phone]   Fax:  [phone]  Pediatric Physical Therapy Treatment  Patient Details  Name: Jody [name] MRN: [id number] Date of Birth: 01/09/1996 Referring Provider: Dr. Velvet Bathe  Encounter date: 10/05/2015      End of Session - 10/07/15 1255    Visit Number 82   Date for PT Re-Evaluation 11/16/15   Authorization Type Medicaid   Authorization Time Period 06/02/15-11/16/15   Authorization - Visit Number 11   Authorization - Number of Visits 24   PT Start Time 1520   PT Stop Time 1600   PT Time Calculation (min) 40 min   Equipment Utilized During Treatment Orthotics   Activity Tolerance Patient tolerated treatment well   Behavior During Therapy Willing to participate      Past Medical History  Diagnosis Date  . Seizures (HCC)   . VP (ventriculoperitoneal) shunt status   . Hydrocephaly   . Hydradenitis     bilateral    Past Surgical History  Procedure Laterality Date  . Shunt replacement  2008    5th shunt for pt.   . Eye surgery    . Hamstring lengthening    . Tendon lengthening    . Botox injection    . Dental examination under anesthesia      There were no vitals filed for this visit.                    Pediatric PT Treatment - 10/07/15 1248    Subjective Information   Patient Comments Mom reports her seizures are increasing and neuro appointment until July.    PT Pediatric Exercise/Activities   Strengthening Activities Core strengthening with lateral reaching crossing midline. Increased encouragement from the right to left midline. Prone on forearms with puzzle activity. Noodle anterior push to hit ball.     Therapeutic Activities   Therapeutic Activity Details w/c to mat with SBA, min-moderate assist to transition from sidelying to sit after prone activity. Slide board transfer mat to w/c x 2.    Pain   Pain  Assessment No/denies pain                 Patient Education - 10/07/15 1255    Education Description discussed session with mom.    Person(s) Educated Mother;Patient   Method Education Verbal explanation;Discussed session   Comprehension Verbalized understanding          Peds PT Short Term Goals - 05/26/15 1319    PEDS PT  SHORT TERM GOAL #1   Title Metztli will be able to roll from the right and to the left with SBA to assist with positioning in the bed.    Baseline Min assist to roll to the left, moderate assist to the right    Time 6   Period Months   Status New   PEDS PT  SHORT TERM GOAL #2   Title Martesha will be able to reach to the right with control 3/5 trials to grab her seat belt to don for safety without falling over.    Baseline Mom reports Taje "flops" over to the right to reach for her seat belt every trial with decreased core control.    Time 6   Period Months   Status New   PEDS PT  SHORT TERM GOAL #3   Title Kampbell will be able to  transition from supine to sitting, sitting to supine  with minimal assist 2/3 trials. right and left.    Baseline . (As of 12/01/14, Adelita required min-mod assist to transition from supine to sitting right to left.)   Time 6   Period Months   Status Achieved   PEDS PT  SHORT TERM GOAL #4   Title [name] will be able to improve her sitting balance by accepting challenges in all directions without LOB to demonstrate improved balance   Baseline 4/7 sitting balance scale (maintains sitting without UE assist > 60 seconds) Emerging to accept challenges but inconsistent.  Increased difficult to the right.    Time 6   Period Months   Status New   PEDS PT  SHORT TERM GOAL #5   Title [name] will be able to stand at ladder for at least 1 minute 3 out of 5 trials to demonstrate improve strength and balance  to assist with ADL activities with CGA   Baseline Max of 60 seconds with fatigue and increase trials more consistent to 30 seconds.     Time 6   Period Months   Status On-going   PEDS PT  SHORT TERM GOAL #6   Title [name] will be able to independently lift her legs onto the mat when transitioning from sit to supine.   Baseline Min A to lift her legs onto the mat to transition on the bed.    Time 6   Period Months   Status On-going          Peds PT Long Term Goals - 05/26/15 1351    PEDS PT  LONG TERM GOAL #1   Title [name] will be able to assist with all transfers throughout the day and stand to help improve her ADLs   Time 6   Period Months   Status On-going          Plan - 10/07/15 1256    Clinical Impression Statement Increase seizure activities recently and appointment made but not until July. Mom worried about shunt function and reports medications dosage can only be increased one more time. She will be at max dose at that time.    PT plan assess goals      Patient will benefit from skilled therapeutic intervention in order to improve the following deficits and impairments:  Decreased interaction with peers, Decreased ability to perform or assist with self-care, Decreased ability to maintain good postural alignment, Decreased function at home and in the community, Decreased abililty to observe the enviornment, Decreased sitting balance, Decreased standing balance  Visit Diagnosis: Muscle weakness  Decreased mobility and endurance   Problem List There are no active problems to display for this patient.   Dellie BurnsFlavia Pratham Cassatt, PT 10/07/2015 12:58 PM Phone: [phone] Fax: [phone]  [facility] [address] [place], [place], [address] Phone: [phone]   Fax:  [phone]  Name: [name] Stankiewicz MRN: [id number] Date of Birth: 01/06/1996

## 2015-10-12 ENCOUNTER — Encounter: Payer: Self-pay | Admitting: Physical Therapy

## 2015-10-12 ENCOUNTER — Ambulatory Visit: Payer: Medicaid Other | Admitting: Physical Therapy

## 2015-10-12 DIAGNOSIS — Z7409 Other reduced mobility: Secondary | ICD-10-CM

## 2015-10-12 DIAGNOSIS — M6281 Muscle weakness (generalized): Secondary | ICD-10-CM | POA: Diagnosis not present

## 2015-10-12 NOTE — Therapy (Signed)
[facility] [address] [place], [place], [address] Phone: [phone]   Fax:  [phone]  Pediatric Physical Therapy Treatment  Patient Details  Name: Jody [name] MRN: [id number] Date of Birth: [birth year] Referring Provider: Dr. Velvet Bathe  Encounter date: 10/12/2015      End of Session - 10/12/15 2141    Visit Number 83   Date for PT Re-Evaluation 11/16/15   Authorization Type Medicaid   Authorization Time Period 06/02/15-11/16/15   Authorization - Visit Number 12   Authorization - Number of Visits 24   PT Start Time 1527   PT Stop Time 1600  Arrived late   PT Time Calculation (min) 33 min   Equipment Utilized During Treatment Orthotics   Activity Tolerance Patient tolerated treatment well   Behavior During Therapy Willing to participate      Past Medical History  Diagnosis Date  . Seizures (HCC)   . VP (ventriculoperitoneal) shunt status   . Hydrocephaly   . Hydradenitis     bilateral    Past Surgical History  Procedure Laterality Date  . Shunt replacement  2008    5th shunt for pt.   . Eye surgery    . Hamstring lengthening    . Tendon lengthening    . Botox injection    . Dental examination under anesthesia      There were no vitals filed for this visit.                    Pediatric PT Treatment - 10/12/15 0001    Subjective Information   Patient Comments Mom reports they were late because of brother's exam.    PT Pediatric Exercise/Activities   Strengthening Activities Core strengthing sit ups from sitting supine onto green wedge with SBA x 10.  Lateral reaching for objects without feet planted on the floor. Over head ball throws with cues to extend the left UE and to look up.    Therapeutic Activities   Therapeutic Activity Details W/c mat transition min-moderate assist transition from sidelying to sit. Mat to w/c slide board transition x 2   Pain   Pain Assessment  No/denies pain                 Patient Education - 10/12/15 2141    Education Description Observed for carry over.    Person(s) Educated Mother;Patient   Method Education Verbal explanation;Observed session   Comprehension Verbalized understanding          Peds PT Short Term Goals - 05/26/15 1319    PEDS PT  SHORT TERM GOAL #1   Title Faatimah will be able to roll from the right and to the left with SBA to assist with positioning in the bed.    Baseline Min assist to roll to the left, moderate assist to the right    Time 6   Period Months   Status New   PEDS PT  SHORT TERM GOAL #2   Title Norell will be able to reach to the right with control 3/5 trials to grab her seat belt to don for safety without falling over.    Baseline Mom reports Navika "flops" over to the right to reach for her seat belt every trial with decreased core control.    Time 6   Period Months   Status New   PEDS PT  SHORT TERM GOAL #3   Title Belia will be able to  transition from  supine to sitting, sitting to supine with minimal assist 2/3 trials. right and left.    Baseline . (As of 12/01/14, Daesha required min-mod assist to transition from supine to sitting right to left.)   Time 6   Period Months   Status Achieved   PEDS PT  SHORT TERM GOAL #4   Title [name] will be able to improve her sitting balance by accepting challenges in all directions without LOB to demonstrate improved balance   Baseline 4/7 sitting balance scale (maintains sitting without UE assist > 60 seconds) Emerging to accept challenges but inconsistent.  Increased difficult to the right.    Time 6   Period Months   Status New   PEDS PT  SHORT TERM GOAL #5   Title [name] will be able to stand at ladder for at least 1 minute 3 out of 5 trials to demonstrate improve strength and balance  to assist with ADL activities with CGA   Baseline Max of 60 seconds with fatigue and increase trials more consistent to 30 seconds.    Time 6    Period Months   Status On-going   PEDS PT  SHORT TERM GOAL #6   Title [name] will be able to independently lift her legs onto the mat when transitioning from sit to supine.   Baseline Min A to lift her legs onto the mat to transition on the bed.    Time 6   Period Months   Status On-going          Peds PT Long Term Goals - 05/26/15 1351    PEDS PT  LONG TERM GOAL #1   Title [name] will be able to assist with all transfers throughout the day and stand to help improve her ADLs   Time 6   Period Months   Status On-going          Plan - 10/12/15 2142    Clinical Impression Statement Did well with sit up activities. Posterior LOB when asked [name] to look up with ball throws over head.  Demonstrating difficulty with transitions from sidelying to sit on mat.    PT plan Sidelying to sit transitions.       Patient will benefit from skilled therapeutic intervention in order to improve the following deficits and impairments:  Decreased interaction with peers, Decreased ability to perform or assist with self-care, Decreased ability to maintain good postural alignment, Decreased function at home and in the community, Decreased abililty to observe the enviornment, Decreased sitting balance, Decreased standing balance  Visit Diagnosis: Muscle weakness  Decreased mobility and endurance   Problem List There are no active problems to display for this patient.  Dellie BurnsFlavia Jaystin Mcgarvey, PT 10/12/2015 9:44 PM Phone: [phone] Fax: [phone]  [facility] [address] [place], [place], [address] Phone: [phone]   Fax:  [phone]  Name: Jody [name] MRN: [id number] Date of Birth: 1995/09/02

## 2015-10-19 ENCOUNTER — Ambulatory Visit: Payer: Medicaid Other | Admitting: Physical Therapy

## 2015-10-19 DIAGNOSIS — M6281 Muscle weakness (generalized): Secondary | ICD-10-CM

## 2015-10-19 DIAGNOSIS — Z7409 Other reduced mobility: Secondary | ICD-10-CM

## 2015-10-20 ENCOUNTER — Encounter: Payer: Self-pay | Admitting: Physical Therapy

## 2015-10-20 NOTE — Therapy (Signed)
[facility] [address] [place], [place], [address] Phone: [phone]   Fax:  [phone]  Pediatric Physical Therapy Treatment  Patient Details  Name: Jody [name] Date of Birth: March 07, 1996 Referring Provider: Dr. Velvet Bathe  Encounter date: 10/19/2015      End of Session - 10/20/15 0903    Visit Number 84   Date for PT Re-Evaluation 11/16/15   Authorization Type Medicaid   Authorization Time Period 06/02/15-11/16/15   Authorization - Visit Number 13   Authorization - Number of Visits 24   PT Start Time 1521   PT Stop Time 1600   PT Time Calculation (min) 39 min   Equipment Utilized During Treatment Orthotics   Activity Tolerance Patient tolerated treatment well   Behavior During Therapy Willing to participate      Past Medical History  Diagnosis Date  . Seizures (HCC)   . VP (ventriculoperitoneal) shunt status   . Hydrocephaly   . Hydradenitis     bilateral    Past Surgical History  Procedure Laterality Date  . Shunt replacement  2008    5th shunt for pt.   . Eye surgery    . Hamstring lengthening    . Tendon lengthening    . Botox injection    . Dental examination under anesthesia      There were no vitals filed for this visit.                    Pediatric PT Treatment - 10/20/15 0912    Subjective Information   Patient Comments Jody [name] reports they are not using the stander at home.    PT Pediatric Exercise/Activities   Strengthening Activities Rolling supine to sidelying min-moderate assist to the left, SBA-min A to the right. Sit ups with green wedge x 3 without assist, x3 with min A.    Therapeutic Activities   Therapeutic Activity Details W/c-mat with SBA. Sidelying to sit with moderate assist. Mat to w/c  x 2 slide board transfer.    Pain   Pain Assessment No/denies pain                 Patient Education - 10/20/15 0917    Education  Provided Yes   Education Description practice rolling and transition to sitting.    Person(s) Educated Patient   Method Education Verbal explanation;Discussed session   Comprehension Verbalized understanding          Peds PT Short Term Goals - 05/26/15 1319    PEDS PT  SHORT TERM GOAL #1   Title Jody [name] will be able to roll from the right and to the left with SBA to assist with positioning in the bed.    Baseline Min assist to roll to the left, moderate assist to the right    Time 6   Period Months   Status New   PEDS PT  SHORT TERM GOAL #2   Title Jody [name] will be able to reach to the right with control 3/5 trials to grab her seat belt to don for safety without falling over.    Baseline Mom reports Jody [name] "flops" over to the right to reach for her seat belt every trial with decreased core control.    Time 6   Period Months   Status New   PEDS PT  SHORT TERM GOAL #3   Title Jody [name] will be able to  transition from supine to sitting, sitting to supine with  minimal assist 2/3 trials. right and left.    Baseline . (As of 12/01/14, Mae required min-mod assist to transition from supine to sitting right to left.)   Time 6   Period Months   Status Achieved   PEDS PT  SHORT TERM GOAL #4   Title Jody [name] will be able to improve her sitting balance by accepting challenges in all directions without LOB to demonstrate improved balance   Baseline 4/7 sitting balance scale (maintains sitting without UE assist > 60 seconds) Emerging to accept challenges but inconsistent.  Increased difficult to the right.    Time 6   Period Months   Status New   PEDS PT  SHORT TERM GOAL #5   Title Jody [name] will be able to stand at ladder for at least 1 minute 3 out of 5 trials to demonstrate improve strength and balance  to assist with ADL activities with CGA   Baseline Max of 60 seconds with fatigue and increase trials more consistent to 30 seconds.    Time 6   Period Months   Status On-going   PEDS PT  SHORT  TERM GOAL #6   Title Jody [name] will be able to independently lift her legs onto the mat when transitioning from sit to supine.   Baseline Min A to lift her legs onto the mat to transition on the bed.    Time 6   Period Months   Status On-going          Peds PT Long Term Goals - 05/26/15 1351    PEDS PT  LONG TERM GOAL #1   Title Jody [name] will be able to assist with all transfers throughout the day and stand to help improve her ADLs   Time 6   Period Months   Status On-going          Plan - 10/20/15 0904    Clinical Impression Statement Continues to demonstrate difficulty with rolling to the left and fatigue with increased trials to the right requiring assist. Unable to transition from sidelying to sit after rolling activity. We discussed importance to practice activities at home. Jody [name] reports she is no longer using stander at home.    PT plan Assess goals.       Patient will benefit from skilled therapeutic intervention in order to improve the following deficits and impairments:  Decreased interaction with peers, Decreased ability to perform or assist with self-care, Decreased ability to maintain good postural alignment, Decreased function at home and in the community, Decreased abililty to observe the enviornment, Decreased sitting balance, Decreased standing balance  Visit Diagnosis: Muscle weakness  Decreased mobility and endurance   Problem List There are no active problems to display for this patient.  Dellie BurnsFlavia Gaither Biehn, PT 10/20/2015 9:19 AM Phone: [phone] Fax: [phone]  [facility] [address] [place], [place], [address] Phone: [phone]   Fax:  [phone]  Name: Jody [name] Date of Birth: 07/05/1995

## 2015-10-26 ENCOUNTER — Ambulatory Visit: Payer: Medicaid Other | Admitting: Physical Therapy

## 2015-10-26 DIAGNOSIS — M6281 Muscle weakness (generalized): Secondary | ICD-10-CM

## 2015-10-26 DIAGNOSIS — Z7409 Other reduced mobility: Secondary | ICD-10-CM

## 2015-10-28 ENCOUNTER — Encounter: Payer: Self-pay | Admitting: Physical Therapy

## 2015-10-28 NOTE — Therapy (Signed)
[facility] [address] [place], [place], [address] Phone: [phone]   Fax:  [phone]  Pediatric Physical Therapy Treatment  Patient Details  Name: Jody [name] MRN: [id number] Date of Birth: April 10, 1996 Referring Provider: Dr. Velvet BathePamela Warner  Encounter date: 10/26/2015      End of Session - 10/28/15 0903    Visit Number 85   Date for PT Re-Evaluation 11/16/15   Authorization Type Medicaid   Authorization Time Period 06/02/15-11/16/15   Authorization - Visit Number 14   Authorization - Number of Visits 24   PT Start Time 1520   PT Stop Time 1600   PT Time Calculation (min) 40 min   Equipment Utilized During Treatment Orthotics   Activity Tolerance Patient tolerated treatment well   Behavior During Therapy Willing to participate      Past Medical History  Diagnosis Date  . Seizures (HCC)   . VP (ventriculoperitoneal) shunt status   . Hydrocephaly   . Hydradenitis     bilateral    Past Surgical History  Procedure Laterality Date  . Shunt replacement  2008    5th shunt for pt.   . Eye surgery    . Hamstring lengthening    . Tendon lengthening    . Botox injection    . Dental examination under anesthesia      There were no vitals filed for this visit.                    Pediatric PT Treatment - 10/28/15 0859    Subjective Information   Patient Comments Jody [name] and mom asked if a church friend could observe the session.    PT Pediatric Exercise/Activities   Strengthening Activities Rolling supine to sidelying min assist to the left, SBA  to the right. Lateral reaching in sitting with min A  required 4/8 trials. Cues to resume full midline posture with visual cues.     Weight Bearing Activities   Weight Bearing Activities W/c-mat with SBA. Sidelying to sit with moderate assist. Mat to w/c  x 2 slide board transfer.     Pain   Pain Assessment No/denies pain                  Patient Education - 10/28/15 0903    Education Provided Yes   Education Description practice rolling and transition to sitting.    Person(s) Educated Patient;Mother   Method Education Verbal explanation;Discussed session   Comprehension Verbalized understanding          Peds PT Short Term Goals - 05/26/15 1319    PEDS PT  SHORT TERM GOAL #1   Title Jody [name] will be able to roll from the right and to the left with SBA to assist with positioning in the bed.    Baseline Min assist to roll to the left, moderate assist to the right    Time 6   Period Months   Status New   PEDS PT  SHORT TERM GOAL #2   Title Jody [name] will be able to reach to the right with control 3/5 trials to grab her seat belt to don for safety without falling over.    Baseline Mom reports Jody [name] "flops" over to the right to reach for her seat belt every trial with decreased core control.    Time 6   Period Months   Status New   PEDS PT  SHORT TERM GOAL #3   Title Jody [name] will be  able to  transition from supine to sitting, sitting to supine with minimal assist 2/3 trials. right and left.    Baseline . (As of 12/01/14, Idamae required min-mod assist to transition from supine to sitting right to left.)   Time 6   Period Months   Status Achieved   PEDS PT  SHORT TERM GOAL #4   Title Jody [name] will be able to improve her sitting balance by accepting challenges in all directions without LOB to demonstrate improved balance   Baseline 4/7 sitting balance scale (maintains sitting without UE assist > 60 seconds) Emerging to accept challenges but inconsistent.  Increased difficult to the right.    Time 6   Period Months   Status New   PEDS PT  SHORT TERM GOAL #5   Title Jody [name] will be able to stand at ladder for at least 1 minute 3 out of 5 trials to demonstrate improve strength and balance  to assist with ADL activities with CGA   Baseline Max of 60 seconds with fatigue and increase trials more consistent to 30 seconds.    Time 6    Period Months   Status On-going   PEDS PT  SHORT TERM GOAL #6   Title Jody [name] will be able to independently lift her legs onto the mat when transitioning from sit to supine.   Baseline Min A to lift her legs onto the mat to transition on the bed.    Time 6   Period Months   Status On-going          Peds PT Long Term Goals - 05/26/15 1351    PEDS PT  LONG TERM GOAL #1   Title Jody [name] will be able to assist with all transfers throughout the day and stand to help improve her ADLs   Time 6   Period Months   Status On-going          Plan - 10/28/15 0903    Clinical Impression Statement Better rolling to the left today with only min A and cues to adduct her LE to complete the roll. Encouraged to practice transitions at home. Stander not utilized at home because mom requires x 2 assist to get her in and her brother has an injury. I have advised not to utilize stander until new assist is trained in the home.    PT plan Continue to work on goals.       Patient will benefit from skilled therapeutic intervention in order to improve the following deficits and impairments:  Decreased interaction with peers, Decreased ability to perform or assist with self-care, Decreased ability to maintain good postural alignment, Decreased function at home and in the community, Decreased abililty to observe the enviornment, Decreased sitting balance, Decreased standing balance  Visit Diagnosis: Muscle weakness  Decreased mobility and endurance   Problem List There are no active problems to display for this patient.   Dellie BurnsFlavia Khani Paino, PT 10/28/2015 9:13 AM Phone: [phone] Fax: [phone]  [facility] [address] [place], [place], [address] Phone: [phone]   Fax:  [phone]  Name: Jody [name] MRN: [id number] Date of Birth: November 08, 1995

## 2015-11-02 ENCOUNTER — Ambulatory Visit: Payer: Medicaid Other | Attending: Pediatrics | Admitting: Physical Therapy

## 2015-11-02 ENCOUNTER — Encounter: Payer: Self-pay | Admitting: Physical Therapy

## 2015-11-02 DIAGNOSIS — Z7409 Other reduced mobility: Secondary | ICD-10-CM | POA: Insufficient documentation

## 2015-11-02 DIAGNOSIS — R2681 Unsteadiness on feet: Secondary | ICD-10-CM | POA: Diagnosis present

## 2015-11-02 DIAGNOSIS — M6281 Muscle weakness (generalized): Secondary | ICD-10-CM | POA: Diagnosis present

## 2015-11-02 NOTE — Therapy (Signed)
[facility] [address] [place], [place], [address] Phone: [phone]   Fax:  [phone]  Pediatric Physical Therapy Treatment  Patient Details  Name: Jody [name] MRN: [id number] Date of Birth: 1995-08-02 Referring Provider: Dr. Velvet BathePamela Warner  Encounter date: 11/02/2015      End of Session - 11/02/15 1610    Visit Number 86   Date for PT Re-Evaluation 11/16/15   Authorization Type Medicaid   Authorization Time Period 06/02/15-11/16/15   Authorization - Visit Number 15   Authorization - Number of Visits 24   PT Start Time 1522  arrived late   PT Stop Time 1600   PT Time Calculation (min) 38 min   Equipment Utilized During Treatment Orthotics   Activity Tolerance Patient tolerated treatment well   Behavior During Therapy Willing to participate      Past Medical History  Diagnosis Date  . Seizures (HCC)   . VP (ventriculoperitoneal) shunt status   . Hydrocephaly   . Hydradenitis     bilateral    Past Surgical History  Procedure Laterality Date  . Shunt replacement  2008    5th shunt for pt.   . Eye surgery    . Hamstring lengthening    . Tendon lengthening    . Botox injection    . Dental examination under anesthesia      There were no vitals filed for this visit.                    Pediatric PT Treatment - 11/02/15 0001    Subjective Information   Patient Comments Mom reports they have been practicing rolling at home   PT Pediatric Exercise/Activities   Strengthening Activities rolling to each side x 10 w/ mod A for rolling to L 10/10 trials and CGA-min A for rolling to R 5/10 trials (vc to move LE over first), lateral reaching in sitting x 6 w/ min A to the L and mod A for balance to the R   Therapeutic Activities   Therapeutic Activity Details w/c to mat transfer w/ SBA, side lying to sit transfer mod A x 1, mat to w/c board transfer w/ mod-max A x 2   Pain   Pain Assessment  No/denies pain                 Patient Education - 11/02/15 1609    Education Provided Yes   Education Description cont to practice rolling at home and transitions   Person(s) Educated Patient;Mother   Method Education Verbal explanation;Observed session;Discussed session   Comprehension Verbalized understanding          Peds PT Short Term Goals - 05/26/15 1319    PEDS PT  SHORT TERM GOAL #1   Title [name] will be able to roll from the right and to the left with SBA to assist with positioning in the bed.    Baseline Min assist to roll to the left, moderate assist to the right    Time 6   Period Months   Status New   PEDS PT  SHORT TERM GOAL #2   Title [name] will be able to reach to the right with control 3/5 trials to grab her seat belt to don for safety without falling over.    Baseline Mom reports [name] "flops" over to the right to reach for her seat belt every trial with decreased core control.    Time 6   Period Months  Status New   PEDS PT  SHORT TERM GOAL #3   Title [name] will be able to  transition from supine to sitting, sitting to supine with minimal assist 2/3 trials. right and left.    Baseline . (As of 12/01/14, Damita required min-mod assist to transition from supine to sitting right to left.)   Time 6   Period Months   Status Achieved   PEDS PT  SHORT TERM GOAL #4   Title [name] will be able to improve her sitting balance by accepting challenges in all directions without LOB to demonstrate improved balance   Baseline 4/7 sitting balance scale (maintains sitting without UE assist > 60 seconds) Emerging to accept challenges but inconsistent.  Increased difficult to the right.    Time 6   Period Months   Status New   PEDS PT  SHORT TERM GOAL #5   Title [name] will be able to stand at ladder for at least 1 minute 3 out of 5 trials to demonstrate improve strength and balance  to assist with ADL activities with CGA   Baseline Max of 60 seconds with fatigue  and increase trials more consistent to 30 seconds.    Time 6   Period Months   Status On-going   PEDS PT  SHORT TERM GOAL #6   Title [name] will be able to independently lift her legs onto the mat when transitioning from sit to supine.   Baseline Min A to lift her legs onto the mat to transition on the bed.    Time 6   Period Months   Status On-going          Peds PT Long Term Goals - 05/26/15 1351    PEDS PT  LONG TERM GOAL #1   Title [name] will be able to assist with all transfers throughout the day and stand to help improve her ADLs   Time 6   Period Months   Status On-going          Plan - 11/02/15 1611    Clinical Impression Statement Discussed with mom and pt the importance of cont to practice rolling at home with the renewal next session. PT and SPT noticed that [name] seemed more fatigued today and had more difficulty with all rolling and sitting activities, especially rolling to the L and reaching to the R in sitting.   PT plan Renewal due next session      Patient will benefit from skilled therapeutic intervention in order to improve the following deficits and impairments:  Decreased interaction with peers, Decreased ability to perform or assist with self-care, Decreased ability to maintain good postural alignment, Decreased function at home and in the community, Decreased abililty to observe the enviornment, Decreased sitting balance, Decreased standing balance  Visit Diagnosis: Muscle weakness  Decreased mobility and endurance  Unsteadiness   Problem List There are no active problems to display for this patient.   Enrigue CatenaJonathan Neev Mcmains, SPT  11/02/2015, 4:18 PM  [facility] [address] [place], [place], [address] Phone: [phone]   Fax:  [phone]  Name: Jody [name] MRN: [id number] Date of Birth: 08/08/1995

## 2015-11-09 ENCOUNTER — Ambulatory Visit: Payer: Medicaid Other | Admitting: Physical Therapy

## 2015-11-09 DIAGNOSIS — M6281 Muscle weakness (generalized): Secondary | ICD-10-CM

## 2015-11-09 DIAGNOSIS — R2681 Unsteadiness on feet: Secondary | ICD-10-CM

## 2015-11-09 DIAGNOSIS — Z7409 Other reduced mobility: Secondary | ICD-10-CM

## 2015-11-10 ENCOUNTER — Encounter: Payer: Self-pay | Admitting: Physical Therapy

## 2015-11-11 NOTE — Therapy (Signed)
[facility] [address] [place], [place], [address] Phone: [phone]   Fax:  [phone]  Pediatric Physical Therapy Treatment  Patient Details  Name: Jody [name] MRN: [id number] Date of Birth: 1995/09/13 Referring Provider: Dr. Velvet Bathe  Encounter date: 11/09/2015      End of Session - 11/10/15 1550    Visit Number 87   Date for PT Re-Evaluation 11/16/15   Authorization Type Medicaid   Authorization Time Period 06/02/15-11/16/15   Authorization - Visit Number 16   Authorization - Number of Visits 24   PT Start Time 1517   PT Stop Time 1600   PT Time Calculation (min) 43 min   Equipment Utilized During Treatment Orthotics   Activity Tolerance Patient tolerated treatment well   Behavior During Therapy Willing to participate      Past Medical History  Diagnosis Date  . Seizures (HCC)   . VP (ventriculoperitoneal) shunt status   . Hydrocephaly   . Hydradenitis     bilateral    Past Surgical History  Procedure Laterality Date  . Shunt replacement  2008    5th shunt for pt.   . Eye surgery    . Hamstring lengthening    . Tendon lengthening    . Botox injection    . Dental examination under anesthesia      There were no vitals filed for this visit.                    Pediatric PT Treatment - 11/10/15 1457    Subjective Information   Patient Comments Mom reports home assistance will only be available in the summer.     PT Pediatric Exercise/Activities   Strengthening Activities Grip strength 10 lbs right, 25 lbs left hand.    Balance Activities Performed   Balance Details Sitting balance with external pushes in all directions.  LOB x 1 with moderate lateral push to the right. Weight shifting reaching to the right and return to sitting.  Reaching for seat belt to the right    Therapeutic Activities   Therapeutic Activity Details Transitions from w/c - sit with SBA x1.  Rolling  from supine to the right SBA, supine to the left min A  x 5 each side. Transition from supine to sitting second attempt with min-moderate assist. from right sidelying to sit. Mat -w/c x 2 slide board transfer.                  Patient Education - 11/10/15 1549    Education Provided Yes   Education Description discussed progress and goals with [employer].    Person(s) Educated Patient;Mother   Method Education Verbal explanation;Observed session;Discussed session   Comprehension Verbalized understanding          Peds PT Short Term Goals - 11/10/15 1551    PEDS PT  SHORT TERM GOAL #1   Title Rebeka will be able to roll from the right and to the left with SBA to assist with positioning in the bed.    Baseline Min assist to roll to the left, moderate assist to the right (as of 7/10, min A to the right, SBA to the left)   Time 6   Period Months   Status On-going   PEDS PT  SHORT TERM GOAL #2   Title Tereza will be able to reach to the right with control 3/5 trials to grab her seat belt to don for safety without falling  over.    Baseline Mom reports Reily "flops" over to the right to reach for her seat belt every trial with decreased core control. (as of 7/10, leans into the armrest on the right and required min A to reach for the belt. difficulty to grasp the belt due to coordination and control of the right hand.)   Time 6   Period Months   Status On-going   PEDS PT  SHORT TERM GOAL #3   Title Kerri will be able to increase grip strength on the right UE to at least 20 lbs to increase grip to assist with ADL activities and grasping objects such as seat belt.    Baseline grip on the right 10 lbs, left 25 lbs.    Time 6   Period Months   Status New   PEDS PT  SHORT TERM GOAL #4   Title Yarelin will be able to improve her sitting balance by accepting challenges in all directions without LOB to demonstrate improved balance   Baseline 4/7 sitting balance scale (maintains sitting  without UE assist > 60 seconds) Emerging to accept challenges but inconsistent.  Increased difficult to the right. (as of 7/10, consistant with accepting challenges all direction but x1 LOB to the right with moderate challenge with fatigue)    Time 6   Period Months   Status Achieved   PEDS PT  SHORT TERM GOAL #5   Title Fatimata will be able to stand at ladder for at least 1 minute 3 out of 5 trials to demonstrate improve strength and balance  to assist with ADL activities with CGA   Baseline Max of 60 seconds with fatigue and increase trials more consistent to 30 seconds. ( no progress due to lack of use of stander at home due to change/injury with assist at home)   Time 6   Period Months   Status Deferred   PEDS PT  SHORT TERM GOAL #6   Title Ethelene will be able to independently lift her legs onto the mat when transitioning from sit to supine.   Baseline Min A to lift her legs onto the mat to transition on the bed.(7/10 slight assist with the left, min A with the right LE)   Time 6   Period Months   Status On-going   PEDS PT  SHORT TERM GOAL #7   Title Corianne will be able to reach to the right and back to midline without falling onto her forearm to demonstrate improved core strength to help with trunk control during ADLS and sitting balance with reaching to the right and car rides in wheelchair.    Baseline Falls onto forearms with reaching to the right weight shift 90% of the time or rests on right armrest in w/c.    Time 6   Period Months   Status New          Peds PT Long Term Goals - 11/10/15 1611    PEDS PT  LONG TERM GOAL #1   Title Ammie will be able to assist with all transfers throughout the day and stand to help improve her ADLs   Time 6   Period Months   Status On-going          Plan - 11/11/15 1030    Clinical Impression Statement [name] is making progress with her goals. Continues to show weakness greater on the right side of her body.  Decreased grip strength in  her hand on the right hinders  independence with seatbelt with her w/c.  In the last 6 months, [name] has situations change at home that hinders her progress. Home assist has changed and current assist is unable to assist her into her stander.  Her brother and mother have had personal injuries and unable to assist with transfers.  [name] will be able to benefit with skilled therapy to address weakness greater on the right, core weakness, abnormality with motor and transitional skills   Rehab Potential Good   Clinical impairments affecting rehab potential N/A   PT Frequency 1X/week   PT Duration 6 months   PT Treatment/Intervention Therapeutic activities;Therapeutic exercises;Neuromuscular reeducation;Patient/family education;Wheelchair management;Orthotic fitting and training;Instruction proper posture/body mechanics;Self-care and home management   PT plan see updated goals.       Patient will benefit from skilled therapeutic intervention in order to improve the following deficits and impairments:  Decreased interaction with peers, Decreased ability to perform or assist with self-care, Decreased ability to maintain good postural alignment, Decreased function at home and in the community, Decreased abililty to observe the enviornment, Decreased sitting balance, Decreased standing balance  Visit Diagnosis: Muscle weakness  Decreased mobility and endurance  Unsteadiness   Problem List There are no active problems to display for this patient.   Dellie BurnsFlavia Aryahna Spagna, PT 11/11/2015 10:39 AM Phone: [phone] Fax: [phone]  [facility] [address] [place], [place], [address] Phone: [phone]   Fax:  [phone]  Name: [name] Lunn MRN: [id number] Date of Birth: [birth year]

## 2015-11-16 ENCOUNTER — Ambulatory Visit: Payer: Medicaid Other | Admitting: Physical Therapy

## 2015-11-23 ENCOUNTER — Ambulatory Visit: Payer: Medicaid Other

## 2015-11-23 DIAGNOSIS — M6281 Muscle weakness (generalized): Secondary | ICD-10-CM

## 2015-11-23 DIAGNOSIS — Z7409 Other reduced mobility: Secondary | ICD-10-CM

## 2015-11-23 DIAGNOSIS — R2681 Unsteadiness on feet: Secondary | ICD-10-CM

## 2015-11-23 NOTE — Therapy (Signed)
[facility] [address] [place], [place], [address] Phone: [phone]   Fax:  [phone]  Pediatric Physical Therapy Treatment  Patient Details  Name: Jody [name] MRN: [id number] Date of Birth: Sep 30, 1995 Referring Provider: Dr. Velvet Bathe  Encounter date: 11/23/2015      End of Session - 11/23/15 1630    Visit Number 8   Date for PT Re-Evaluation 05/02/16   Authorization Type Medicaid   Authorization Time Period 11/17/2015-05/02/2016   Authorization - Visit Number 1   Authorization - Number of Visits 24   PT Start Time 1517   PT Stop Time 1600   PT Time Calculation (min) 43 min   Equipment Utilized During Treatment Orthotics   Activity Tolerance Patient tolerated treatment well   Behavior During Therapy Willing to participate      Past Medical History:  Diagnosis Date  . Hydradenitis    bilateral  . Hydrocephaly   . Seizures (HCC)   . VP (ventriculoperitoneal) shunt status     Past Surgical History:  Procedure Laterality Date  . BOTOX INJECTION    . DENTAL EXAMINATION UNDER ANESTHESIA    . EYE SURGERY    . HAMSTRING LENGTHENING    . shunt replacement  2008   5th shunt for pt.   . TENDON LENGTHENING      There were no vitals filed for this visit.                    Pediatric PT Treatment - 11/23/15 0001      Subjective Information   Patient Comments [name] is working 4 hours a day at her job now.     PT Pediatric Exercise/Activities   Strengthening Activities grip strength exercise with yellow putty in sitting position on mat with LE support, rolling to each side x 10 with CGA rolling to the R and min-mod A with rolling to the R side with increased A needed as the session progressed,      Balance Activities Performed   Balance Details sitting balance on mat with LE and SBA-CGA     Therapeutic Activities   Therapeutic Activity Details Transitions from w/c - sit with SBA  x1. Transition from supine to sitting with mod A . Mat -w/c x 2 slide board transfer.      Pain   Pain Assessment No/denies pain                 Patient Education - 11/23/15 1629    Education Provided Yes   Education Description Provided information about a possible Nurse, adult option for [employer] and mom.   Person(s) Educated Mother;Patient   Method Education Verbal explanation;Handout   Comprehension Verbalized understanding          Peds PT Short Term Goals - 11/10/15 1551      PEDS PT  SHORT TERM GOAL #1   Title Emmely will be able to roll from the right and to the left with SBA to assist with positioning in the bed.    Baseline Min assist to roll to the left, moderate assist to the right (as of 7/10, min A to the right, SBA to the left)   Time 6   Period Months   Status On-going     PEDS PT  SHORT TERM GOAL #2   Title Patriece will be able to reach to the right with control 3/5 trials to grab her seat belt to don for safety without  falling over.    Baseline Mom reports [name] "flops" over to the right to reach for her seat belt every trial with decreased core control. (as of 7/10, leans into the armrest on the right and required min A to reach for the belt. difficulty to grasp the belt due to coordination and control of the right hand.)   Time 6   Period Months   Status On-going     PEDS PT  SHORT TERM GOAL #3   Title [name] will be able to increase grip strength on the right UE to at least 20 lbs to increase grip to assist with ADL activities and grasping objects such as seat belt.    Baseline grip on the right 10 lbs, left 25 lbs.    Time 6   Period Months   Status New     PEDS PT  SHORT TERM GOAL #4   Title [name] will be able to improve her sitting balance by accepting challenges in all directions without LOB to demonstrate improved balance   Baseline 4/7 sitting balance scale (maintains sitting without UE assist > 60 seconds) Emerging to accept challenges but  inconsistent.  Increased difficult to the right. (as of 7/10, consistant with accepting challenges all direction but x1 LOB to the right with moderate challenge with fatigue)    Time 6   Period Months   Status Achieved     PEDS PT  SHORT TERM GOAL #5   Title [name] will be able to stand at ladder for at least 1 minute 3 out of 5 trials to demonstrate improve strength and balance  to assist with ADL activities with CGA   Baseline Max of 60 seconds with fatigue and increase trials more consistent to 30 seconds. ( no progress due to lack of use of stander at home due to change/injury with assist at home)   Time 6   Period Months   Status Deferred     PEDS PT  SHORT TERM GOAL #6   Title [name] will be able to independently lift her legs onto the mat when transitioning from sit to supine.   Baseline Min A to lift her legs onto the mat to transition on the bed.(7/10 slight assist with the left, min A with the right LE)   Time 6   Period Months   Status On-going     PEDS PT  SHORT TERM GOAL #7   Title [name] will be able to reach to the right and back to midline without falling onto her forearm to demonstrate improved core strength to help with trunk control during ADLS and sitting balance with reaching to the right and car rides in wheelchair.    Baseline Falls onto forearms with reaching to the right weight shift 90% of the time or rests on right armrest in w/c.    Time 6   Period Months   Status New          Peds PT Long Term Goals - 11/10/15 1611      PEDS PT  LONG TERM GOAL #1   Title Malak will be able to assist with all transfers throughout the day and stand to help improve her ADLs   Time 6   Period Months   Status On-going          Plan - 11/23/15 1631    Clinical Impression Statement [name] showed signs of fatigue today and had more trouble rolling to the R than usual. She required  CGA to roll to the R and min-mod A to the L which progressed as the session continued.  SHe performed well when sitting and working with the yellow putty.   PT plan Rolling and grip strength      Patient will benefit from skilled therapeutic intervention in order to improve the following deficits and impairments:  Decreased interaction with peers, Decreased ability to perform or assist with self-care, Decreased ability to maintain good postural alignment, Decreased function at home and in the community, Decreased abililty to observe the enviornment, Decreased sitting balance, Decreased standing balance  Visit Diagnosis: Muscle weakness  Decreased mobility and endurance  Unsteadiness   Problem List There are no active problems to display for this patient.  Enrigue Catena, SPT 11/23/2015, 4:34 PM  [facility] [address] [place], [place], [address] Phone: [phone]   Fax:  [phone]  Name: Emmrie Darras MRN: [id number] Date of Birth: Jul 09, 1995

## 2015-11-30 ENCOUNTER — Ambulatory Visit: Payer: Medicaid Other | Admitting: Physical Therapy

## 2015-11-30 ENCOUNTER — Encounter: Payer: Self-pay | Admitting: Physical Therapy

## 2015-11-30 DIAGNOSIS — M6281 Muscle weakness (generalized): Secondary | ICD-10-CM

## 2015-11-30 DIAGNOSIS — Z7409 Other reduced mobility: Secondary | ICD-10-CM

## 2015-11-30 DIAGNOSIS — R2681 Unsteadiness on feet: Secondary | ICD-10-CM

## 2015-11-30 NOTE — Therapy (Signed)
[facility] [address] [place], [place], [address] Phone: [phone]   Fax:  [phone]  Pediatric Physical Therapy Treatment  Patient Details  Name: Jody [name] MRN: [id number] Date of Birth: 1995-10-27 Referring Provider: Dr. Velvet Bathe  Encounter date: 11/30/2015      End of Session - 11/30/15 1636    Visit Number 9   Date for PT Re-Evaluation 05/02/16   Authorization Time Period 11/17/2015-05/02/2016   Authorization - Visit Number 2   Authorization - Number of Visits 24   PT Start Time 1530  pt arrived late   PT Stop Time 1557   PT Time Calculation (min) 27 min   Equipment Utilized During Treatment Orthotics   Activity Tolerance Patient tolerated treatment well   Behavior During Therapy Willing to participate      Past Medical History:  Diagnosis Date  . Hydradenitis    bilateral  . Hydrocephaly   . Seizures (HCC)   . VP (ventriculoperitoneal) shunt status     Past Surgical History:  Procedure Laterality Date  . BOTOX INJECTION    . DENTAL EXAMINATION UNDER ANESTHESIA    . EYE SURGERY    . HAMSTRING LENGTHENING    . shunt replacement  2008   5th shunt for pt.   . TENDON LENGTHENING      There were no vitals filed for this visit.                    Pediatric PT Treatment - 11/30/15 0001      Subjective Information   Patient Comments Mom apologized for running late today. She discussed the options for lifts at home.     PT Pediatric Exercise/Activities   Strengthening Activities grip strength exercise with yellow putty in sitting position on mat without LE support, rolling to each side x 10 with SBA-CGA rolling to the R and CGA-min A with rolling to the L with increased help needed as the session progressed     Therapeutic Activities   Therapeutic Activity Details Transitions from w/c - sit with SBA x1. Transition from supine to sitting with mod A . Mat -w/c x 2 slide  board transfer.      Pain   Pain Assessment No/denies pain                 Patient Education - 11/30/15 1632    Education Provided Yes   Education Description Discussed continuing HEP at home (rolling and grip strength exercises)   Person(s) Educated Patient   Method Education Verbal explanation   Comprehension Verbalized understanding          Peds PT Short Term Goals - 11/10/15 1551      PEDS PT  SHORT TERM GOAL #1   Title Jalia will be able to roll from the right and to the left with SBA to assist with positioning in the bed.    Baseline Min assist to roll to the left, moderate assist to the right (as of 7/10, min A to the right, SBA to the left)   Time 6   Period Months   Status On-going     PEDS PT  SHORT TERM GOAL #2   Title Meily will be able to reach to the right with control 3/5 trials to grab her seat belt to don for safety without falling over.    Baseline Mom reports Tanayia "flops" over to the right to reach for her seat belt every  trial with decreased core control. (as of 7/10, leans into the armrest on the right and required min A to reach for the belt. difficulty to grasp the belt due to coordination and control of the right hand.)   Time 6   Period Months   Status On-going     PEDS PT  SHORT TERM GOAL #3   Title Yeng will be able to increase grip strength on the right UE to at least 20 lbs to increase grip to assist with ADL activities and grasping objects such as seat belt.    Baseline grip on the right 10 lbs, left 25 lbs.    Time 6   Period Months   Status New     PEDS PT  SHORT TERM GOAL #4   Title Jakki will be able to improve her sitting balance by accepting challenges in all directions without LOB to demonstrate improved balance   Baseline 4/7 sitting balance scale (maintains sitting without UE assist > 60 seconds) Emerging to accept challenges but inconsistent.  Increased difficult to the right. (as of 7/10, consistant with accepting  challenges all direction but x1 LOB to the right with moderate challenge with fatigue)    Time 6   Period Months   Status Achieved     PEDS PT  SHORT TERM GOAL #5   Title Sahalie will be able to stand at ladder for at least 1 minute 3 out of 5 trials to demonstrate improve strength and balance  to assist with ADL activities with CGA   Baseline Max of 60 seconds with fatigue and increase trials more consistent to 30 seconds. ( no progress due to lack of use of stander at home due to change/injury with assist at home)   Time 6   Period Months   Status Deferred     PEDS PT  SHORT TERM GOAL #6   Title Bradlee will be able to independently lift her legs onto the mat when transitioning from sit to supine.   Baseline Min A to lift her legs onto the mat to transition on the bed.(7/10 slight assist with the left, min A with the right LE)   Time 6   Period Months   Status On-going     PEDS PT  SHORT TERM GOAL #7   Title Nea will be able to reach to the right and back to midline without falling onto her forearm to demonstrate improved core strength to help with trunk control during ADLS and sitting balance with reaching to the right and car rides in wheelchair.    Baseline Falls onto forearms with reaching to the right weight shift 90% of the time or rests on right armrest in w/c.    Time 6   Period Months   Status New          Peds PT Long Term Goals - 11/10/15 1611      PEDS PT  LONG TERM GOAL #1   Title Timi will be able to assist with all transfers throughout the day and stand to help improve her ADLs   Time 6   Period Months   Status On-going          Plan - 11/30/15 1637    Clinical Impression Statement Dorella showed signs improvement today with rolling to the R side compared to last week (back to normal from before last weeks session). Did well with the yellow putty again today.   PT plan Continue to practice rolling  and grip strength (velcro, picking up marbles, putty)       Patient will benefit from skilled therapeutic intervention in order to improve the following deficits and impairments:  Decreased interaction with peers, Decreased ability to perform or assist with self-care, Decreased ability to maintain good postural alignment, Decreased function at home and in the community, Decreased abililty to observe the enviornment, Decreased sitting balance, Decreased standing balance  Visit Diagnosis: Muscle weakness  Decreased mobility and endurance  Unsteadiness   Problem List There are no active problems to display for this patient.  Enrigue Catena, SPT  11/30/2015, 4:51 PM  [facility] [address] [place], [place], [address] Phone: [phone]   Fax:  [phone]  Name: Ravin Davitt MRN: [id number] Date of Birth: May 11, 1995

## 2015-12-07 ENCOUNTER — Ambulatory Visit: Payer: Commercial Managed Care - HMO | Admitting: Physical Therapy

## 2015-12-14 ENCOUNTER — Ambulatory Visit: Payer: Commercial Managed Care - HMO | Attending: Pediatrics

## 2015-12-14 DIAGNOSIS — G809 Cerebral palsy, unspecified: Secondary | ICD-10-CM | POA: Insufficient documentation

## 2015-12-14 DIAGNOSIS — M6281 Muscle weakness (generalized): Secondary | ICD-10-CM | POA: Diagnosis not present

## 2015-12-14 DIAGNOSIS — R2681 Unsteadiness on feet: Secondary | ICD-10-CM | POA: Diagnosis not present

## 2015-12-14 DIAGNOSIS — Z7409 Other reduced mobility: Secondary | ICD-10-CM | POA: Insufficient documentation

## 2015-12-14 NOTE — Therapy (Addendum)
[facility] [address] [place], [place], [address] Phone: [phone]   Fax:  [phone]  Pediatric Physical Therapy Treatment  Patient Details  Name: Jody [name] MRN: [id number] Date of Birth: 06/13/1995 Referring Provider: Dr. Velvet Bathe  Encounter date: 12/14/2015      End of Session - 12/14/15 1648    Visit Number 10   Date for PT Re-Evaluation 05/02/16   Authorization Type Medicaid   Authorization Time Period 11/17/2015-05/02/2016   Authorization - Visit Number 3   Authorization - Number of Visits 24   PT Start Time 1518   PT Stop Time 1600   PT Time Calculation (min) 42 min   Equipment Utilized During Treatment Orthotics   Activity Tolerance Patient tolerated treatment well   Behavior During Therapy Willing to participate      Past Medical History:  Diagnosis Date  . Hydradenitis    bilateral  . Hydrocephaly   . Seizures (HCC)   . VP (ventriculoperitoneal) shunt status     Past Surgical History:  Procedure Laterality Date  . BOTOX INJECTION    . DENTAL EXAMINATION UNDER ANESTHESIA    . EYE SURGERY    . HAMSTRING LENGTHENING    . shunt replacement  2008   5th shunt for pt.   . TENDON LENGTHENING      There were no vitals filed for this visit.                    Pediatric PT Treatment - 12/14/15 0001      Subjective Information   Patient Comments Jody [name] reported practicing rolling at home.      PT Pediatric Exercise/Activities   Strengthening Activities grip strength exercise with yellow putty and picking up marbles in sitting position on mat without LE support, rolling to each side x 10 with SBA-CGA rolling to the R and min A with rolling to the L with increased help needed as the session progressed     Weight Bearing Activities   Weight Bearing Activities W/c-mat with SBA. Sidelying to sit with moderate assist. Mat to w/c  x 2 slide board transfer.       Balance  Activities Performed   Balance Details sitting balance on the side of the mat x 20 min     Pain   Pain Assessment No/denies pain                 Patient Education - 12/14/15 1648    Education Provided Yes   Education Description Discussed continuing HEP at home (rolling and grip strength exercises)   Person(s) Educated Patient   Method Education Verbal explanation   Comprehension Verbalized understanding          Peds PT Short Term Goals - 11/10/15 1551      PEDS PT  SHORT TERM GOAL #1   Title Jody [name] will be able to roll from the right and to the left with SBA to assist with positioning in the bed.    Baseline Min assist to roll to the left, moderate assist to the right (as of 7/10, min A to the right, SBA to the left)   Time 6   Period Months   Status On-going     PEDS PT  SHORT TERM GOAL #2   Title Jody [name] will be able to reach to the right with control 3/5 trials to grab her seat belt to don for safety without falling over.  Baseline Mom reports Jody [name] "flops" over to the right to reach for her seat belt every trial with decreased core control. (as of 7/10, leans into the armrest on the right and required min A to reach for the belt. difficulty to grasp the belt due to coordination and control of the right hand.)   Time 6   Period Months   Status On-going     PEDS PT  SHORT TERM GOAL #3   Title Jody [name] will be able to increase grip strength on the right UE to at least 20 lbs to increase grip to assist with ADL activities and grasping objects such as seat belt.    Baseline grip on the right 10 lbs, left 25 lbs.    Time 6   Period Months   Status New     PEDS PT  SHORT TERM GOAL #4   Title Jody [name] will be able to improve her sitting balance by accepting challenges in all directions without LOB to demonstrate improved balance   Baseline 4/7 sitting balance scale (maintains sitting without UE assist > 60 seconds) Emerging to accept challenges but inconsistent.   Increased difficult to the right. (as of 7/10, consistant with accepting challenges all direction but x1 LOB to the right with moderate challenge with fatigue)    Time 6   Period Months   Status Achieved     PEDS PT  SHORT TERM GOAL #5   Title Jody [name] will be able to stand at ladder for at least 1 minute 3 out of 5 trials to demonstrate improve strength and balance  to assist with ADL activities with CGA   Baseline Max of 60 seconds with fatigue and increase trials more consistent to 30 seconds. ( no progress due to lack of use of stander at home due to change/injury with assist at home)   Time 6   Period Months   Status Deferred     PEDS PT  SHORT TERM GOAL #6   Title Jody [name] will be able to independently lift her legs onto the mat when transitioning from sit to supine.   Baseline Min A to lift her legs onto the mat to transition on the bed.(7/10 slight assist with the left, min A with the right LE)   Time 6   Period Months   Status On-going     PEDS PT  SHORT TERM GOAL #7   Title Jody [name] will be able to reach to the right and back to midline without falling onto her forearm to demonstrate improved core strength to help with trunk control during ADLS and sitting balance with reaching to the right and car rides in wheelchair.    Baseline Falls onto forearms with reaching to the right weight shift 90% of the time or rests on right armrest in w/c.    Time 6   Period Months   Status New          Peds PT Long Term Goals - 11/10/15 1611      PEDS PT  LONG TERM GOAL #1   Title Jody [name] will be able to assist with all transfers throughout the day and stand to help improve her ADLs   Time 6   Period Months   Status On-going          Plan - 12/14/15 1649    Clinical Impression Statement Jody [name] did well with yellow putty and marble activity today and did not lose her sitting balance during those activities. She continues to  function about the same for rolling but has been better about  practicing this at home.   PT plan Continue to challenge sitting balance and grip strengthening      Patient will benefit from skilled therapeutic intervention in order to improve the following deficits and impairments:  Decreased interaction with peers, Decreased ability to perform or assist with self-care, Decreased ability to maintain good postural alignment, Decreased function at home and in the community, Decreased abililty to observe the enviornment, Decreased sitting balance, Decreased standing balance  Visit Diagnosis: Muscle weakness  Unsteadiness  Decreased mobility and endurance      G-Codes - 01/08/16 1352    Functional Assessment Tool Used Clinical judgement   Functional Limitation Changing and maintaining body position   Changing and Maintaining Body Position Current Status ([id number]) At least 80 percent but less than 100 percent impaired, limited or restricted   Changing and Maintaining Body Position Goal Status (X9147(G8982) At least 60 percent but less than 80 percent impaired, limited or restricted      Problem List There are no active problems to display for this patient.  Enrigue CatenaJonathan Shamanda Len, SPT 12/14/2015, 4:51 PM  [facility] [address] [place], [place], [address] Phone: [phone]   Fax:  [phone]  Name: Jody [name] MRN: [id number] Date of Birth: 1996/04/08

## 2015-12-21 ENCOUNTER — Ambulatory Visit: Payer: Commercial Managed Care - HMO

## 2015-12-21 DIAGNOSIS — M6281 Muscle weakness (generalized): Secondary | ICD-10-CM

## 2015-12-21 DIAGNOSIS — R2681 Unsteadiness on feet: Secondary | ICD-10-CM | POA: Diagnosis not present

## 2015-12-21 DIAGNOSIS — Z7409 Other reduced mobility: Secondary | ICD-10-CM

## 2015-12-21 DIAGNOSIS — G809 Cerebral palsy, unspecified: Secondary | ICD-10-CM | POA: Diagnosis not present

## 2015-12-21 NOTE — Therapy (Signed)
[facility] [address] [place], [place], [address] Phone: [phone]   Fax:  [phone]  Pediatric Physical Therapy Treatment  Patient Details  Name: Jody [name] Ch[place] MRN: [id number] Date of Birth: [birth year] Referring Provider: Dr. Velvet BathePamela Warner  Encounter date: 12/21/2015      End of Session - 12/21/15 1627    Visit Number 11   Date for PT Re-Evaluation 05/02/16   Authorization Type Medicaid   Authorization Time Period 11/17/2015-05/02/2016   Authorization - Visit Number 4   Authorization - Number of Visits 24   PT Start Time 1515   PT Stop Time 1600  minus one charge for equipment   PT Time Calculation (min) 45 min   Equipment Utilized During Treatment Orthotics   Activity Tolerance Patient tolerated treatment well   Behavior During Therapy Willing to participate      Past Medical History:  Diagnosis Date  . Hydradenitis    bilateral  . Hydrocephaly   . Seizures (HCC)   . VP (ventriculoperitoneal) shunt status     Past Surgical History:  Procedure Laterality Date  . BOTOX INJECTION    . DENTAL EXAMINATION UNDER ANESTHESIA    . EYE SURGERY    . HAMSTRING LENGTHENING    . shunt replacement  2008   5th shunt for pt.   . TENDON LENGTHENING      There were no vitals filed for this visit.                    Pediatric PT Treatment - 12/21/15 0001      Subjective Information   Patient Comments Jody [name] came today with [facility] for equipment discussion     PT Pediatric Exercise/Activities   Strengthening Activities Grip strengthening using yellow puddy and rolling out with R hand into "snake" positioning. Gripping and hand strengthening B hands using velcro roller up and down x5 each hand with moderate resistance on each. LAQs with moderate A x10 BLE. Abduction squeezes 10x5 sec hold BLE     Therapeutic Activities   Therapeutic Activity Details Completing puzzle by holding magnet in R hand  and moving pieces with R hand to complete puzzle.      Pain   Pain Assessment No/denies pain                 Patient Education - 12/21/15 1627    Education Provided Yes   Education Description Carryover for home use   Person(s) Educated Patient   Method Education Verbal explanation   Comprehension Verbalized understanding          Peds PT Short Term Goals - 11/10/15 1551      PEDS PT  SHORT TERM GOAL #1   Title Jody [name] will be able to roll from the right and to the left with SBA to assist with positioning in the bed.    Baseline Min assist to roll to the left, moderate assist to the right (as of 7/10, min A to the right, SBA to the left)   Time 6   Period Months   Status On-going     PEDS PT  SHORT TERM GOAL #2   Title Jody [name] will be able to reach to the right with control 3/5 trials to grab her seat belt to don for safety without falling over.    Baseline Mom reports Jody [name] "flops" over to the right to reach for her seat belt every trial with decreased core control. (as of 7/10,  leans into the armrest on the right and required min A to reach for the belt. difficulty to grasp the belt due to coordination and control of the right hand.)   Time 6   Period Months   Status On-going     PEDS PT  SHORT TERM GOAL #3   Title Jody [name] will be able to increase grip strength on the right UE to at least 20 lbs to increase grip to assist with ADL activities and grasping objects such as seat belt.    Baseline grip on the right 10 lbs, left 25 lbs.    Time 6   Period Months   Status New     PEDS PT  SHORT TERM GOAL #4   Title Jody [name] will be able to improve her sitting balance by accepting challenges in all directions without LOB to demonstrate improved balance   Baseline 4/7 sitting balance scale (maintains sitting without UE assist > 60 seconds) Emerging to accept challenges but inconsistent.  Increased difficult to the right. (as of 7/10, consistant with accepting challenges all  direction but x1 LOB to the right with moderate challenge with fatigue)    Time 6   Period Months   Status Achieved     PEDS PT  SHORT TERM GOAL #5   Title Jody [name] will be able to stand at ladder for at least 1 minute 3 out of 5 trials to demonstrate improve strength and balance  to assist with ADL activities with CGA   Baseline Max of 60 seconds with fatigue and increase trials more consistent to 30 seconds. ( no progress due to lack of use of stander at home due to change/injury with assist at home)   Time 6   Period Months   Status Deferred     PEDS PT  SHORT TERM GOAL #6   Title Jody [name] will be able to independently lift her legs onto the mat when transitioning from sit to supine.   Baseline Min A to lift her legs onto the mat to transition on the bed.(7/10 slight assist with the left, min A with the right LE)   Time 6   Period Months   Status On-going     PEDS PT  SHORT TERM GOAL #7   Title Jody [name] will be able to reach to the right and back to midline without falling onto her forearm to demonstrate improved core strength to help with trunk control during ADLS and sitting balance with reaching to the right and car rides in wheelchair.    Baseline Falls onto forearms with reaching to the right weight shift 90% of the time or rests on right armrest in w/c.    Time 6   Period Months   Status New          Peds PT Long Term Goals - 11/10/15 1611      PEDS PT  LONG TERM GOAL #1   Title Jody [name] will be able to assist with all transfers throughout the day and stand to help improve her ADLs   Time 6   Period Months   Status On-going          Plan - 12/21/15 1628    Clinical Impression Statement [facility] worked hard today focusing on UE grip strengthening and LE strengthening. Doug from [employer] was present to discuss equipment objects with family and PT.    PT plan sitting balance and grip strengthening      Patient will benefit from skilled therapeutic  intervention  in order to improve the following deficits and impairments:  Decreased interaction with peers, Decreased ability to perform or assist with self-care, Decreased ability to maintain good postural alignment, Decreased function at home and in the community, Decreased abililty to observe the enviornment, Decreased sitting balance, Decreased standing balance  Visit Diagnosis: Muscle weakness  Unsteadiness  Decreased mobility and endurance   Problem List There are no active problems to display for this patient.   Fredrich BirksRobinette, Dvid Pendry Elizabeth 12/21/2015, 4:30 PM  [facility] [address] [place], [place], [address] Phone: [phone]   Fax:  [phone]  Name: Jody [name] Minn[place] MRN: [id number] Date of Birth: 11-03-1995   12/21/2015 Fredrich Birksobinette, Kemari Mares Elizabeth PTA

## 2015-12-28 ENCOUNTER — Encounter: Payer: Self-pay | Admitting: Physical Therapy

## 2015-12-28 ENCOUNTER — Ambulatory Visit: Payer: Commercial Managed Care - HMO | Admitting: Physical Therapy

## 2015-12-28 DIAGNOSIS — G809 Cerebral palsy, unspecified: Secondary | ICD-10-CM

## 2015-12-28 DIAGNOSIS — Z7409 Other reduced mobility: Secondary | ICD-10-CM | POA: Diagnosis not present

## 2015-12-28 DIAGNOSIS — R2681 Unsteadiness on feet: Secondary | ICD-10-CM

## 2015-12-28 DIAGNOSIS — M6281 Muscle weakness (generalized): Secondary | ICD-10-CM

## 2015-12-28 NOTE — Therapy (Signed)
[facility] [address] [place], [place], [address] Phone: [phone]   Fax:  [phone]  Pediatric Physical Therapy Treatment  Patient Details  Name: Jody [name] MRN: [id number] Date of Birth: 05-Apr-1996 Referring Provider: Dr. Velvet BathePamela Warner  Encounter date: 12/28/2015      End of Session - 12/28/15 1630    Visit Number 12   Date for PT Re-Evaluation 05/02/16   Authorization Type Medicaid   Authorization Time Period 11/17/2015-05/02/2016   Authorization - Visit Number 5   Authorization - Number of Visits 24   PT Start Time 1515   PT Stop Time 1555   PT Time Calculation (min) 40 min   Equipment Utilized During Treatment Orthotics   Activity Tolerance Patient tolerated treatment well   Behavior During Therapy Willing to participate      Past Medical History:  Diagnosis Date  . Hydradenitis    bilateral  . Hydrocephaly   . Seizures (HCC)   . VP (ventriculoperitoneal) shunt status     Past Surgical History:  Procedure Laterality Date  . BOTOX INJECTION    . DENTAL EXAMINATION UNDER ANESTHESIA    . EYE SURGERY    . HAMSTRING LENGTHENING    . shunt replacement  2008   5th shunt for pt.   . TENDON LENGTHENING      There were no vitals filed for this visit.                    Pediatric PT Treatment - 12/28/15 0001      Subjective Information   Patient Comments Mom came in and reported Jody [name]'s brother had started school today and discussed with PT insurance changes.     PT Pediatric Exercise/Activities   Strengthening Activities Gripping and hand strengthening B hands using velcro roller up and down x5 each hand with moderate resistance on each. grip strengthening device on B hands for ~2 min each     Weight Bearing Activities   Weight Bearing Activities W/c-mat with SBA. Sidelying to sit with moderate assist. Mat to w/c  x 2 slide board transfer.       Balance Activities Performed    Balance Details sitting balance on swiss disc with SBA     Therapeutic Activities   Therapeutic Activity Details Rolling B directions with increased A needed for rolling to L (min-mod A) and CGA for rolling to the R x 5     Pain   Pain Assessment No/denies pain                 Patient Education - 12/28/15 1629    Education Provided Yes   Education Description Carryover for home use   Person(s) Educated Mother   Method Education Verbal explanation;Observed session   Comprehension Verbalized understanding          Peds PT Short Term Goals - 11/10/15 1551      PEDS PT  SHORT TERM GOAL #1   Title Jody [name] will be able to roll from the right and to the left with SBA to assist with positioning in the bed.    Baseline Min assist to roll to the left, moderate assist to the right (as of 7/10, min A to the right, SBA to the left)   Time 6   Period Months   Status On-going     PEDS PT  SHORT TERM GOAL #2   Title Jody [name] will be able to reach to the right with  control 3/5 trials to grab her seat belt to don for safety without falling over.    Baseline Mom reports Jody [name] "flops" over to the right to reach for her seat belt every trial with decreased core control. (as of 7/10, leans into the armrest on the right and required min A to reach for the belt. difficulty to grasp the belt due to coordination and control of the right hand.)   Time 6   Period Months   Status On-going     PEDS PT  SHORT TERM GOAL #3   Title Jody [name] will be able to increase grip strength on the right UE to at least 20 lbs to increase grip to assist with ADL activities and grasping objects such as seat belt.    Baseline grip on the right 10 lbs, left 25 lbs.    Time 6   Period Months   Status New     PEDS PT  SHORT TERM GOAL #4   Title Jody [name] will be able to improve her sitting balance by accepting challenges in all directions without LOB to demonstrate improved balance   Baseline 4/7 sitting balance  scale (maintains sitting without UE assist > 60 seconds) Emerging to accept challenges but inconsistent.  Increased difficult to the right. (as of 7/10, consistant with accepting challenges all direction but x1 LOB to the right with moderate challenge with fatigue)    Time 6   Period Months   Status Achieved     PEDS PT  SHORT TERM GOAL #5   Title Jody [name] will be able to stand at ladder for at least 1 minute 3 out of 5 trials to demonstrate improve strength and balance  to assist with ADL activities with CGA   Baseline Max of 60 seconds with fatigue and increase trials more consistent to 30 seconds. ( no progress due to lack of use of stander at home due to change/injury with assist at home)   Time 6   Period Months   Status Deferred     PEDS PT  SHORT TERM GOAL #6   Title Jody [name] will be able to independently lift her legs onto the mat when transitioning from sit to supine.   Baseline Min A to lift her legs onto the mat to transition on the bed.(7/10 slight assist with the left, min A with the right LE)   Time 6   Period Months   Status On-going     PEDS PT  SHORT TERM GOAL #7   Title Jody [name] will be able to reach to the right and back to midline without falling onto her forearm to demonstrate improved core strength to help with trunk control during ADLS and sitting balance with reaching to the right and car rides in wheelchair.    Baseline Falls onto forearms with reaching to the right weight shift 90% of the time or rests on right armrest in w/c.    Time 6   Period Months   Status New          Peds PT Long Term Goals - 11/10/15 1611      PEDS PT  LONG TERM GOAL #1   Title Jody [name] will be able to assist with all transfers throughout the day and stand to help improve her ADLs   Time 6   Period Months   Status On-going          Plan - 12/28/15 1632    Clinical Impression Statement Jody Ridgel did well sitting on  the swiss disc today and is demonstrating improved grip strength with  velcro activites. Still requires help with rolling mainly to the L.   PT plan Grip strength and sitting on swiss disc      Patient will benefit from skilled therapeutic intervention in order to improve the following deficits and impairments:  Decreased interaction with peers, Decreased ability to perform or assist with self-care, Decreased ability to maintain good postural alignment, Decreased function at home and in the community, Decreased abililty to observe the enviornment, Decreased sitting balance, Decreased standing balance  Visit Diagnosis: Cerebral palsy, unspecified (HCC)  Muscle weakness  Unsteadiness  Decreased mobility and endurance   Problem List There are no active problems to display for this patient.  Enrigue Catena, SPT 12/28/2015, 4:39 PM  [facility] [address] [place], [place], [address] Phone: [phone]   Fax:  [phone]  Name: Jody [name] MRN: [id number] Date of Birth: [birth year]

## 2016-01-11 ENCOUNTER — Ambulatory Visit: Payer: Medicaid Other

## 2016-01-18 ENCOUNTER — Ambulatory Visit: Payer: Medicaid Other | Attending: Pediatrics | Admitting: Physical Therapy

## 2016-01-25 ENCOUNTER — Ambulatory Visit: Payer: Medicaid Other | Admitting: Physical Therapy

## 2016-01-25 ENCOUNTER — Telehealth: Payer: Self-pay | Admitting: Physical Therapy

## 2016-01-25 NOTE — Telephone Encounter (Signed)
[facility].  Mailbox was full can not leave message.

## 2016-02-01 ENCOUNTER — Encounter: Payer: Self-pay | Admitting: Physical Therapy

## 2016-02-01 ENCOUNTER — Ambulatory Visit: Payer: Commercial Managed Care - HMO | Attending: Pediatrics | Admitting: Physical Therapy

## 2016-02-01 DIAGNOSIS — G809 Cerebral palsy, unspecified: Secondary | ICD-10-CM | POA: Diagnosis not present

## 2016-02-01 DIAGNOSIS — M6281 Muscle weakness (generalized): Secondary | ICD-10-CM | POA: Diagnosis present

## 2016-02-01 DIAGNOSIS — R2681 Unsteadiness on feet: Secondary | ICD-10-CM | POA: Insufficient documentation

## 2016-02-01 DIAGNOSIS — Z7409 Other reduced mobility: Secondary | ICD-10-CM | POA: Insufficient documentation

## 2016-02-01 NOTE — Therapy (Signed)
[facility] [address] [place], [place], [address] Phone: [phone]   Fax:  [phone]  Pediatric Physical Therapy Treatment  Patient Details  Name: Jody [name] MRN: [id number] Date of Birth: 1995/05/20 Referring Provider: Dr. Velvet Bathe  Encounter date: 02/01/2016      End of Session - 02/01/16 1655    Visit Number 13   Date for PT Re-Evaluation 05/02/16   Authorization Type [employer]   Authorization Time Period 01/14/2016-02/28/2016   Authorization - Visit Number 1   Authorization - Number of Visits 6   PT Start Time 1516   PT Stop Time 1555   PT Time Calculation (min) 39 min   Equipment Utilized During Treatment Orthotics   Activity Tolerance Patient tolerated treatment well   Behavior During Therapy Willing to participate      Past Medical History:  Diagnosis Date  . Hydradenitis    bilateral  . Hydrocephaly   . Seizures (HCC)   . VP (ventriculoperitoneal) shunt status     Past Surgical History:  Procedure Laterality Date  . BOTOX INJECTION    . DENTAL EXAMINATION UNDER ANESTHESIA    . EYE SURGERY    . HAMSTRING LENGTHENING    . shunt replacement  2008   5th shunt for pt.   . TENDON LENGTHENING      There were no vitals filed for this visit.                    Pediatric PT Treatment - 02/01/16 1648      Subjective Information   Patient Comments Mom says they switched back to [facility] gold plan as of Oct 1.     PT Pediatric Exercise/Activities   Strengthening Activities Gripping and hand strengthening B hands using velcro roller up and down x5 each hand with moderate resistance on each, sitting on edge of mat with perturbations in all directions for core strength, lateral reaching x 5 with A to maintain balance during reaching     Weight Bearing Activities   Weight Bearing Activities W/c-mat with SBA. Sidelying to sit with moderate assist. Mat to w/c  x 2 slide board  transfer.       Therapeutic Activities   Therapeutic Activity Details Rolling B directions with increased A with rolling to the L (mod A at hips), CGA needed when rolling to the R near end of activity     Pain   Pain Assessment No/denies pain                 Patient Education - 02/01/16 1655    Education Provided Yes   Education Description [employer] for home use   Person(s) Educated Mother   Method Education Verbal explanation;Observed session   Comprehension Verbalized understanding          Peds PT Short Term Goals - 11/10/15 1551      PEDS PT  SHORT TERM GOAL #1   Title Reyann will be able to roll from the right and to the left with SBA to assist with positioning in the bed.    Baseline Min assist to roll to the left, moderate assist to the right (as of 7/10, min A to the right, SBA to the left)   Time 6   Period Months   Status On-going     PEDS PT  SHORT TERM GOAL #2   Title Daveah will be able to reach to the right with control 3/5 trials to  grab her seat belt to don for safety without falling over.    Baseline Mom reports Namita "flops" over to the right to reach for her seat belt every trial with decreased core control. (as of 7/10, leans into the armrest on the right and required min A to reach for the belt. difficulty to grasp the belt due to coordination and control of the right hand.)   Time 6   Period Months   Status On-going     PEDS PT  SHORT TERM GOAL #3   Title Shantrell will be able to increase grip strength on the right UE to at least 20 lbs to increase grip to assist with ADL activities and grasping objects such as seat belt.    Baseline grip on the right 10 lbs, left 25 lbs.    Time 6   Period Months   Status New     PEDS PT  SHORT TERM GOAL #4   Title Tynslee will be able to improve her sitting balance by accepting challenges in all directions without LOB to demonstrate improved balance   Baseline 4/7 sitting balance scale (maintains sitting  without UE assist > 60 seconds) Emerging to accept challenges but inconsistent.  Increased difficult to the right. (as of 7/10, consistant with accepting challenges all direction but x1 LOB to the right with moderate challenge with fatigue)    Time 6   Period Months   Status Achieved     PEDS PT  SHORT TERM GOAL #5   Title Ethelwyn will be able to stand at ladder for at least 1 minute 3 out of 5 trials to demonstrate improve strength and balance  to assist with ADL activities with CGA   Baseline Max of 60 seconds with fatigue and increase trials more consistent to 30 seconds. ( no progress due to lack of use of stander at home due to change/injury with assist at home)   Time 6   Period Months   Status Deferred     PEDS PT  SHORT TERM GOAL #6   Title Viveka will be able to independently lift her legs onto the mat when transitioning from sit to supine.   Baseline Min A to lift her legs onto the mat to transition on the bed.(7/10 slight assist with the left, min A with the right LE)   Time 6   Period Months   Status On-going     PEDS PT  SHORT TERM GOAL #7   Title Lana will be able to reach to the right and back to midline without falling onto her forearm to demonstrate improved core strength to help with trunk control during ADLS and sitting balance with reaching to the right and car rides in wheelchair.    Baseline Falls onto forearms with reaching to the right weight shift 90% of the time or rests on right armrest in w/c.    Time 6   Period Months   Status New          Peds PT Long Term Goals - 11/10/15 1611      PEDS PT  LONG TERM GOAL #1   Title Veora will be able to assist with all transfers throughout the day and stand to help improve her ADLs   Time 6   Period Months   Status On-going          Plan - 02/01/16 1658    Clinical Impression Statement Semaj continues to show difficulty with rolling to the  L activities and has consistently required mod A. She has done  better when sitting on the edge of the mat and did well with perturbations today. Her ability to manipulate the velcro has also improved showing possible increase in grip strength.   PT plan rolling and check grip strength      Patient will benefit from skilled therapeutic intervention in order to improve the following deficits and impairments:  Decreased interaction with peers, Decreased ability to perform or assist with self-care, Decreased ability to maintain good postural alignment, Decreased function at home and in the community, Decreased abililty to observe the enviornment, Decreased sitting balance, Decreased standing balance  Visit Diagnosis: Cerebral palsy, unspecified type (HCC)  Muscle weakness  Unsteadiness   Problem List There are no active problems to display for this patient.  Enrigue CatenaJonathan Corianne Buccellato, SPT 02/01/2016, 5:01 PM  [facility] [address] [place], [place], [address] Phone: [phone]   Fax:  [phone]  Name: [name] Berninger MRN: [id number] Date of Birth: 06-Feb-1996

## 2016-02-08 ENCOUNTER — Encounter: Payer: Self-pay | Admitting: Physical Therapy

## 2016-02-08 ENCOUNTER — Ambulatory Visit: Payer: Commercial Managed Care - HMO | Admitting: Physical Therapy

## 2016-02-08 DIAGNOSIS — G809 Cerebral palsy, unspecified: Secondary | ICD-10-CM | POA: Diagnosis not present

## 2016-02-08 DIAGNOSIS — M6281 Muscle weakness (generalized): Secondary | ICD-10-CM

## 2016-02-08 NOTE — Therapy (Signed)
[facility] [address] [place], [place], [address] Phone: [phone]   Fax:  [phone]  Pediatric Physical Therapy Treatment  Patient Details  Name: Jody [name] MRN: [id number] Date of Birth: 06-03-1995 Referring Provider: Dr. Velvet Bathe  Encounter date: 02/08/2016      End of Session - 02/08/16 1656    Visit Number 14   Date for PT Re-Evaluation 05/02/16   Authorization Type [employer]   Authorization Time Period 01/14/2016-02/28/2016   Authorization - Visit Number 2   Authorization - Number of Visits 6   PT Start Time 1515   PT Stop Time 1558   PT Time Calculation (min) 43 min   Equipment Utilized During Treatment Orthotics   Activity Tolerance Patient tolerated treatment well   Behavior During Therapy Willing to participate      Past Medical History:  Diagnosis Date  . Hydradenitis    bilateral  . Hydrocephaly   . Seizures (HCC)   . VP (ventriculoperitoneal) shunt status     Past Surgical History:  Procedure Laterality Date  . BOTOX INJECTION    . DENTAL EXAMINATION UNDER ANESTHESIA    . EYE SURGERY    . HAMSTRING LENGTHENING    . shunt replacement  2008   5th shunt for pt.   . TENDON LENGTHENING      There were no vitals filed for this visit.                    Pediatric PT Treatment - 02/08/16 1610      Subjective Information   Patient Comments Mom reports they have been trying get a quote for a new chair/stander they need.     PT Pediatric Exercise/Activities   Strengthening Activities Gripping and hand strengthening B hands using velcro roller up and down x5 each hand with moderate resistance on each, sitting on edge of mat on swiss disc with feet supported and UE's on table     Weight Bearing Activities   Weight Bearing Activities W/c-mat with SBA. Sidelying to sit with moderate assist. Mat to w/c  x 2 slide board transfer.       Therapeutic Activities   Therapeutic Activity Details Rolling B directions with increased A with rolling to the L (mod A at hips), CGA needed when rolling to the R near end of activity     Pain   Pain Assessment No/denies pain                 Patient Education - 02/08/16 1655    Education Provided Yes   Education Description [employer] for home use   Person(s) Educated Mother   Method Education Verbal explanation;Observed session   Comprehension Verbalized understanding          Peds PT Short Term Goals - 11/10/15 1551      PEDS PT  SHORT TERM GOAL #1   Title Jody [name] will be able to roll from the right and to the left with SBA to assist with positioning in the bed.    Baseline Min assist to roll to the left, moderate assist to the right (as of 7/10, min A to the right, SBA to the left)   Time 6   Period Months   Status On-going     PEDS PT  SHORT TERM GOAL #2   Title Jody [name] will be able to reach to the right with control 3/5 trials to grab her seat belt to don for safety  without falling over.    Baseline Mom reports Jody [name] "flops" over to the right to reach for her seat belt every trial with decreased core control. (as of 7/10, leans into the armrest on the right and required min A to reach for the belt. difficulty to grasp the belt due to coordination and control of the right hand.)   Time 6   Period Months   Status On-going     PEDS PT  SHORT TERM GOAL #3   Title Jody [name] will be able to increase grip strength on the right UE to at least 20 lbs to increase grip to assist with ADL activities and grasping objects such as seat belt.    Baseline grip on the right 10 lbs, left 25 lbs.    Time 6   Period Months   Status New     PEDS PT  SHORT TERM GOAL #4   Title Jody [name] will be able to improve her sitting balance by accepting challenges in all directions without LOB to demonstrate improved balance   Baseline 4/7 sitting balance scale (maintains sitting without UE assist > 60 seconds) Emerging to  accept challenges but inconsistent.  Increased difficult to the right. (as of 7/10, consistant with accepting challenges all direction but x1 LOB to the right with moderate challenge with fatigue)    Time 6   Period Months   Status Achieved     PEDS PT  SHORT TERM GOAL #5   Title Jody [name] will be able to stand at ladder for at least 1 minute 3 out of 5 trials to demonstrate improve strength and balance  to assist with ADL activities with CGA   Baseline Max of 60 seconds with fatigue and increase trials more consistent to 30 seconds. ( no progress due to lack of use of stander at home due to change/injury with assist at home)   Time 6   Period Months   Status Deferred     PEDS PT  SHORT TERM GOAL #6   Title Jody [name] will be able to independently lift her legs onto the mat when transitioning from sit to supine.   Baseline Min A to lift her legs onto the mat to transition on the bed.(7/10 slight assist with the left, min A with the right LE)   Time 6   Period Months   Status On-going     PEDS PT  SHORT TERM GOAL #7   Title Jody [name] will be able to reach to the right and back to midline without falling onto her forearm to demonstrate improved core strength to help with trunk control during ADLS and sitting balance with reaching to the right and car rides in wheelchair.    Baseline Falls onto forearms with reaching to the right weight shift 90% of the time or rests on right armrest in w/c.    Time 6   Period Months   Status New          Peds PT Long Term Goals - 11/10/15 1611      PEDS PT  LONG TERM GOAL #1   Title Jody [name] will be able to assist with all transfers throughout the day and stand to help improve her ADLs   Time 6   Period Months   Status On-going          Plan - 02/08/16 1656    Clinical Impression Statement Jody [name] did marginally better today with rolling to the L requiring mod A to a certain point  then is able to roll the rest of the way. She sat well on the swiss disc  and continues to do well with grip activities.   PT plan rolling and reassess grip strength      Patient will benefit from skilled therapeutic intervention in order to improve the following deficits and impairments:  Decreased interaction with peers, Decreased ability to perform or assist with self-care, Decreased ability to maintain good postural alignment, Decreased function at home and in the community, Decreased abililty to observe the enviornment, Decreased sitting balance, Decreased standing balance  Visit Diagnosis: Cerebral palsy, unspecified type (HCC)  Muscle weakness   Problem List There are no active problems to display for this patient.  Jody [name], SPT 02/08/2016, 4:59 PM  [facility] [address] [place], [place], [address] Phone: [phone]   Fax:  [phone]  Name: Jody [name] MRN: [id number] Date of Birth: 09-26-1995

## 2016-02-15 ENCOUNTER — Ambulatory Visit: Payer: Commercial Managed Care - HMO | Admitting: Physical Therapy

## 2016-02-15 ENCOUNTER — Encounter: Payer: Self-pay | Admitting: Physical Therapy

## 2016-02-15 DIAGNOSIS — G809 Cerebral palsy, unspecified: Secondary | ICD-10-CM

## 2016-02-15 DIAGNOSIS — M6281 Muscle weakness (generalized): Secondary | ICD-10-CM

## 2016-02-15 NOTE — Therapy (Signed)
[facility] [address] [place], [place], [address] Phone: [phone]   Fax:  [phone]  Pediatric Physical Therapy Treatment  Patient Details  Name: Jody [name] MRN: [id number] Date of Birth: [birth year] Referring Provider: Dr. Velvet Bathe  Encounter date: 02/15/2016      End of Session - 02/15/16 1619    Visit Number 15   Date for PT Re-Evaluation 05/02/16   Authorization Type [employer]   Authorization Time Period 01/14/2016-02/28/2016   Authorization - Visit Number 3   Authorization - Number of Visits 6   PT Start Time 1515   PT Stop Time 1556   PT Time Calculation (min) 41 min   Equipment Utilized During Treatment Orthotics   Activity Tolerance Patient tolerated treatment well   Behavior During Therapy Willing to participate      Past Medical History:  Diagnosis Date  . Hydradenitis    bilateral  . Hydrocephaly   . Seizures (HCC)   . VP (ventriculoperitoneal) shunt status     Past Surgical History:  Procedure Laterality Date  . BOTOX INJECTION    . DENTAL EXAMINATION UNDER ANESTHESIA    . EYE SURGERY    . HAMSTRING LENGTHENING    . shunt replacement  2008   5th shunt for pt.   . TENDON LENGTHENING      There were no vitals filed for this visit.                    Pediatric PT Treatment - 02/15/16 1615      Subjective Information   Patient Comments Mom requested to work on something other than rolling in today's session. She reported that they work on core exercises at the [facility] and that she believes she is making progress.     PT Pediatric Exercise/Activities   Strengthening Activities Gripping and hand strengthening B hands using velcro roller up and down x2 each hand with moderate resistance on each and picking up velcro tools with each hand, sitting on edge of mat on swiss disc with feet unsupported and UE's on table, perturbations in all directions sitting on/off swiss  disc, reaching across body for objects on table while sitting on swiss disc x 6 each UE, prone prop on forearms with alternating use of UE to place puzzle piece     Weight Bearing Activities   Weight Bearing Activities W/c-mat with SBA. Sidelying to sit with moderate assist. Mat to w/c  x 2 slide board transfer.       Pain   Pain Assessment No/denies pain                 Patient Education - 02/15/16 1619    Education Provided Yes   Education Description [employer]over for home use. Discussed continuing to practice perturbations, rolling, and prone activites at home.   Person(s) Educated Mother   Method Education Verbal explanation;Observed session   Comprehension Verbalized understanding          Peds PT Short Term Goals - 11/10/15 1551      PEDS PT  SHORT TERM GOAL #1   Title Jody [name] will be able to roll from the right and to the left with SBA to assist with positioning in the bed.    Baseline Min assist to roll to the left, moderate assist to the right (as of 7/10, min A to the right, SBA to the left)   Time 6   Period Months   Status On-going  PEDS PT  SHORT TERM GOAL #2   Title Jody [name] will be able to reach to the right with control 3/5 trials to grab her seat belt to don for safety without falling over.    Baseline Mom reports Ceaira "flops" over to the right to reach for her seat belt every trial with decreased core control. (as of 7/10, leans into the armrest on the right and required min A to reach for the belt. difficulty to grasp the belt due to coordination and control of the right hand.)   Time 6   Period Months   Status On-going     PEDS PT  SHORT TERM GOAL #3   Title Jody [name] will be able to increase grip strength on the right UE to at least 20 lbs to increase grip to assist with ADL activities and grasping objects such as seat belt.    Baseline grip on the right 10 lbs, left 25 lbs.    Time 6   Period Months   Status New     PEDS PT  SHORT TERM GOAL #4    Title Jody [name] will be able to improve her sitting balance by accepting challenges in all directions without LOB to demonstrate improved balance   Baseline 4/7 sitting balance scale (maintains sitting without UE assist > 60 seconds) Emerging to accept challenges but inconsistent.  Increased difficult to the right. (as of 7/10, consistant with accepting challenges all direction but x1 LOB to the right with moderate challenge with fatigue)    Time 6   Period Months   Status Achieved     PEDS PT  SHORT TERM GOAL #5   Title Jody [name] will be able to stand at ladder for at least 1 minute 3 out of 5 trials to demonstrate improve strength and balance  to assist with ADL activities with CGA   Baseline Max of 60 seconds with fatigue and increase trials more consistent to 30 seconds. ( no progress due to lack of use of stander at home due to change/injury with assist at home)   Time 6   Period Months   Status Deferred     PEDS PT  SHORT TERM GOAL #6   Title Jody [name] will be able to independently lift her legs onto the mat when transitioning from sit to supine.   Baseline Min A to lift her legs onto the mat to transition on the bed.(7/10 slight assist with the left, min A with the right LE)   Time 6   Period Months   Status On-going     PEDS PT  SHORT TERM GOAL #7   Title Jody [name] will be able to reach to the right and back to midline without falling onto her forearm to demonstrate improved core strength to help with trunk control during ADLS and sitting balance with reaching to the right and car rides in wheelchair.    Baseline Falls onto forearms with reaching to the right weight shift 90% of the time or rests on right armrest in w/c.    Time 6   Period Months   Status New          Peds PT Long Term Goals - 11/10/15 1611      PEDS PT  LONG TERM GOAL #1   Title Jody [name] will be able to assist with all transfers throughout the day and stand to help improve her ADLs   Time 6   Period Months   Status  On-going  Plan - 02/15/16 1621    Clinical Impression Statement PT session today was focused on other forms of core strengthening through perturbations, prone activities, and sitting on the swiss disc. Jody [name] continues to display difficulty with activities of leaning towards the right side. Sitting on the swiss disc is especially difficult for her with perturbations.    PT plan core strengthening      Patient will benefit from skilled therapeutic intervention in order to improve the following deficits and impairments:  Decreased interaction with peers, Decreased ability to perform or assist with self-care, Decreased ability to maintain good postural alignment, Decreased function at home and in the community, Decreased abililty to observe the enviornment, Decreased sitting balance, Decreased standing balance  Visit Diagnosis: Cerebral palsy, unspecified type (HCC)  Muscle weakness   Problem List There are no active problems to display for this patient.  Enrigue CatenaJonathan Nicolaos Mitrano, SPT 02/15/2016, 4:26 PM  [facility] [address] [place], [place], [address] Phone: [phone]   Fax:  [phone]  Name: Jody [name] MRN: [id number] Date of Birth: Sep 18, 1995

## 2016-02-22 ENCOUNTER — Ambulatory Visit: Payer: Commercial Managed Care - HMO | Admitting: Physical Therapy

## 2016-02-22 DIAGNOSIS — R2681 Unsteadiness on feet: Secondary | ICD-10-CM

## 2016-02-22 DIAGNOSIS — M6281 Muscle weakness (generalized): Secondary | ICD-10-CM

## 2016-02-22 DIAGNOSIS — G809 Cerebral palsy, unspecified: Secondary | ICD-10-CM

## 2016-02-22 DIAGNOSIS — Z7409 Other reduced mobility: Secondary | ICD-10-CM

## 2016-02-23 ENCOUNTER — Encounter: Payer: Self-pay | Admitting: Physical Therapy

## 2016-02-23 NOTE — Therapy (Signed)
[place], [place], [address] Phone: [phone]   Fax:  [phone]  Pediatric Physical Therapy Treatment  Patient Details  Name: Jody [name] MRN: [id number] Date of Birth: 07/24/1995 Referring Provider: Dr. Alba Cory  Encounter date: 02/22/2016      End of Session - 02/23/16 0919    Visit Number 16   Date for PT Re-Evaluation 05/02/16   Authorization Type [employer] and Medicaid   Authorization Time Period 01/14/2016-02/28/2016   Authorization - Visit Number 4   Authorization - Number of Visits 6  3 units only due to w/c consult   PT Start Time 1515   PT Stop Time 1620   PT Time Calculation (min) 65 min   Equipment Utilized During Treatment Orthotics   Activity Tolerance Patient tolerated treatment well   Behavior During Therapy Willing to participate      Past Medical History:  Diagnosis Date  . Hydradenitis    bilateral  . Hydrocephaly   . Seizures ([facility])   . VP (ventriculoperitoneal) shunt status     Past Surgical History:  Procedure Laterality Date  . BOTOX INJECTION    . DENTAL EXAMINATION UNDER ANESTHESIA    . EYE SURGERY    . HAMSTRING LENGTHENING    . shunt replacement  2008   5th shunt for pt.   . TENDON LENGTHENING      There were no vitals filed for this visit.                    Pediatric PT Treatment - 02/23/16 0910      Subjective Information   Patient Comments Jody [name] is excited about her w/c consult.      PT Pediatric Exercise/Activities   Strengthening Activities Grip strength average 25 lbs on the right and left 35 lbs.    Self-care W/c consult with Deberah Pelton from [facility]. See assessment.      Balance Activities Performed   Balance Details Sitting balance with pillow to assist to lean more to the left side.  Reaching to the right and posterior SBA and Jennilyn anchoring her hand on the mat 4/5 trials.  Slight touch assist  1 trial. Reaching for seat belt to the right  x 2 with SBA.      Therapeutic Activities   Therapeutic Activity Details Transition from w/c to mat with SBA, Mat to w/c with slide board x 2 max assist. Transition from sidelying to sit with moderate A.      Pain   Pain Assessment No/denies pain                 Patient Education - 02/23/16 0918    Education Provided Yes   Education Description Mother and Ysabelle participated in the w/c consult. Discussed progress.  Observed for carryover   Person(s) Educated Mother;Patient   Method Education Verbal explanation;Observed session;Questions addressed   Comprehension Verbalized understanding          Peds PT Short Term Goals - 02/23/16 1324      PEDS PT  SHORT TERM GOAL #1   Title Nevaen will be able to roll from the right and to the left with SBA to assist with positioning in the bed.    Baseline rolling to the left moderate assist at hip with increased assist as activity continues, right SBA but slight CGA with increase fatigue (initial base line had the directions and assist backwards)   Time  6   Period Months   Status On-going     PEDS PT  SHORT TERM GOAL #2   Title Jody [name] will be able to reach to the right with control 3/5 trials to grab her seat belt to don for safety without falling over.    Baseline Mom reports Jody [name] "flops" over to the right to reach for her seat belt every trial with decreased core control. (as of 7/10, leans into the armrest on the right and required min A to reach for the belt. difficulty to grasp the belt due to coordination and control of the right hand.)   Time 6   Period Months   Status Achieved     PEDS PT  SHORT TERM GOAL #3   Title Jody [name] will be able to increase grip strength on the right UE to at least 20 lbs to increase grip to assist with ADL activities and grasping objects such as seat belt.    Baseline grip on the right 10 lbs, left 25 lbs.    Time 6   Period Months   Status Achieved      PEDS PT  SHORT TERM GOAL #6   Title Jody [name] will be able to independently lift her legs onto the mat when transitioning from sit to supine.   Baseline no change with slight assist with the left, min A with the right [name]   Time 6   Period Months   Status On-going     PEDS PT  SHORT TERM GOAL #7   Title Jody [name] will be able to reach to the right and back to midline without falling onto her forearm to demonstrate improved core strength to help with trunk control during ADLS and sitting balance with reaching to the right and car rides in wheelchair.    Baseline requires stabilization with the use of her left hand to reach behind with the right UE,  x 1 with slight assist but did not rest her elbow on the mat.    Time 6   Period Months   Status On-going     PEDS PT  SHORT TERM GOAL #8   Title Jody [name] and mom will be able to indicate community activities to initate a maintenance program after discharge.     Baseline not consistent but report a membership at the [facility]    Time 6   Period Months   Status New          Peds PT Long Term Goals - 02/23/16 1331      PEDS PT  LONG TERM GOAL #1   Title Jody [name] will be able to assist with all transfers throughout the day and stand to help improve her ADLs   Time 6   Period Months   Status On-going          Plan - 02/23/16 1415    Clinical Impression Statement Jody [name] has made great progress in her grip strength increasing at least 10 lbs in each hand.  She met her goal to shift to the right without LOB to buckle her seatbelt.  She is making progress with her goal to reach posturally to the right without LOB.  She does require the use of her left UE to stabilize her sitting with reaching to the right.  She demonstrates moderate weight bearing on her right bottom but moderate shift her thoracic trunk to the left resulting in a curvature in her spine.  Mom reports they are monitoring for scoliosis.  Jody [name]  continues to require moderate assist to  roll to the left and minimal assist to bring her legs up on the mat from sitting position to supine. Mom reports she has made great improvements with her sitting core strength with less LOB while in the car and is able to leave Jody [name] in her home to complete putting her seat belt on without assist.  She participated in a w/c consult today.  Jody [name] is very interested in a sit to stand power chair option to increase her independence in the home, posture and assist with her standing program.  Jody [name] will benefit with skilled therapy to address core weakness hindering posture and assist with ADLs activities, sitting balance and muscle weakness.    Rehab Potential Good   Clinical impairments affecting rehab potential N/A   PT Frequency 1X/week   PT Duration 6 months   PT Treatment/Intervention Therapeutic activities;Therapeutic exercises;Neuromuscular reeducation;Patient/family education;Wheelchair management;Orthotic fitting and training;Self-care and home management   PT plan see updated goals. Core strengthening.       Patient will benefit from skilled therapeutic intervention in order to improve the following deficits and impairments:  Decreased interaction with peers, Decreased ability to perform or assist with self-care, Decreased ability to maintain good postural alignment, Decreased function at home and in the community, Decreased abililty to observe the enviornment, Decreased sitting balance, Decreased standing balance  Visit Diagnosis: Cerebral palsy, unspecified type (HCC)  Muscle weakness  Unsteadiness  Decreased mobility and endurance   Problem List There are no active problems to display for this patient.   Zachery Dauer, PT 02/23/16 2:23 PM Phone: [phone] Fax: [place] [address] [place], [place], [address] Phone: [phone]   Fax:  [phone]  Name: Jody [name] MRN:  [id number] Date of Birth: 03-29-1996

## 2016-02-29 ENCOUNTER — Ambulatory Visit: Payer: Commercial Managed Care - HMO | Admitting: Physical Therapy

## 2016-03-07 ENCOUNTER — Encounter: Payer: Self-pay | Admitting: Physical Therapy

## 2016-03-07 ENCOUNTER — Ambulatory Visit: Payer: Medicare HMO | Attending: Pediatrics | Admitting: Physical Therapy

## 2016-03-07 DIAGNOSIS — Z7409 Other reduced mobility: Secondary | ICD-10-CM | POA: Diagnosis present

## 2016-03-07 DIAGNOSIS — M6281 Muscle weakness (generalized): Secondary | ICD-10-CM | POA: Insufficient documentation

## 2016-03-07 DIAGNOSIS — G809 Cerebral palsy, unspecified: Secondary | ICD-10-CM | POA: Insufficient documentation

## 2016-03-08 NOTE — Therapy (Signed)
[facility] [place], [place], [address] Phone: [phone]   Fax:  [phone]  Pediatric Physical Therapy Treatment  Patient Details  Name: Nusrat Encarnacion MRN: [id number] Date of Birth: [birth year] Referring Provider: Dr. Velvet Bathe  Encounter date: 03/07/2016      End of Session - 03/07/16 1616    Visit Number 17   Date for PT Re-Evaluation 05/02/16   Authorization Type [employer] and Medicaid   Authorization Time Period 11/17/2015 - 05/02/2016   Authorization - Visit Number 10   Authorization - Number of Visits 24   PT Start Time 1519   PT Stop Time 1558   PT Time Calculation (min) 39 min   Equipment Utilized During Treatment Orthotics   Activity Tolerance Patient tolerated treatment well   Behavior During Therapy Willing to participate      Past Medical History:  Diagnosis Date  . Hydradenitis    bilateral  . Hydrocephaly   . Seizures ([facility])   . VP (ventriculoperitoneal) shunt status     Past Surgical History:  Procedure Laterality Date  . BOTOX INJECTION    . DENTAL EXAMINATION UNDER ANESTHESIA    . EYE SURGERY    . HAMSTRING LENGTHENING    . shunt replacement  2008   5th shunt for pt.   . TENDON LENGTHENING      There were no vitals filed for this visit.                    Pediatric PT Treatment - 03/07/16 1603      Subjective Information   Patient Comments Salimatou requested to do the UBE today.     PT Pediatric Exercise/Activities   Strengthening Activities Gripping and hand strengthening B hands using velcro roller up and down x2 each hand with moderate resistance on each and picking up velcro tools with each hand,pronee prop on forearms with alternating use of UE to place puzzle piece     Weight Bearing Activities   Weight Bearing Activities W/c-mat with SBA. Sidelying to sit with moderate assist. Mat to w/c  x 2 slide board transfer.       Therapeutic  Activities   Therapeutic Activity Details UBE for 10 min at level 2.5                 Patient Education - 03/07/16 1612    Education Provided Yes   Education Description Discussed going to [facility] with [employer].   Person(s) Educated Patient   Method Education Verbal explanation;Discussed session   Comprehension Verbalized understanding          Peds PT Short Term Goals - 02/23/16 1324      PEDS PT  SHORT TERM GOAL #1   Title Kennley will be able to roll from the right and to the left with SBA to assist with positioning in the bed.    Baseline rolling to the left moderate assist at hip with increased assist as activity continues, right SBA but slight CGA with increase fatigue (initial base line had the directions and assist backwards)   Time 6   Period Months   Status On-going     PEDS PT  SHORT TERM GOAL #2   Title Makaylyn will be able to reach to the right with control 3/5 trials to grab her seat belt to don for safety without falling over.    Baseline Mom reports Chrishawna "flops" over to the right to reach  for her seat belt every trial with decreased core control. (as of 7/10, leans into the armrest on the right and required min A to reach for the belt. difficulty to grasp the belt due to coordination and control of the right hand.)   Time 6   Period Months   Status Achieved     PEDS PT  SHORT TERM GOAL #3   Title [name] will be able to increase grip strength on the right UE to at least 20 lbs to increase grip to assist with ADL activities and grasping objects such as seat belt.    Baseline grip on the right 10 lbs, left 25 lbs.    Time 6   Period Months   Status Achieved     PEDS PT  SHORT TERM GOAL #6   Title [name] will be able to independently lift her legs onto the mat when transitioning from sit to supine.   Baseline no change with slight assist with the left, min A with the right LE   Time 6   Period Months   Status On-going     PEDS PT  SHORT TERM GOAL #7    Title [name] will be able to reach to the right and back to midline without falling onto her forearm to demonstrate improved core strength to help with trunk control during ADLS and sitting balance with reaching to the right and car rides in wheelchair.    Baseline requires stabilization with the use of her left hand to reach behind with the right UE,  x 1 with slight assist but did not rest her elbow on the mat.    Time 6   Period Months   Status On-going     PEDS PT  SHORT TERM GOAL #8   Title [name] and mom will be able to indicate community activities to initate a maintenance program after discharge.     Baseline not consistent but [facility]    Time 6   Period Months   Status New          Peds PT Long Term Goals - 02/23/16 1331      PEDS PT  LONG TERM GOAL #1   Title [name] will be able to assist with all transfers throughout the day and stand to help improve her ADLs   Time 6   Period Months   Status On-going          Plan - 03/08/16 1236    Clinical Impression Statement [name] did well today while in the prone position and was able to lean on each UE to utilize the other UE to complete a task. She verbalized fatigue with extended time in prone but was able to do all activities. Sehe had increased difficulty leaning on her L side and using her RUE.   PT plan core strengthening and rolling      Patient will benefit from skilled therapeutic intervention in order to improve the following deficits and impairments:  Decreased interaction with peers, Decreased ability to perform or assist with self-care, Decreased ability to maintain good postural alignment, Decreased function at home and in the community, Decreased abililty to observe the enviornment, Decreased sitting balance, Decreased standing balance  Visit Diagnosis: Cerebral palsy, unspecified type (HCC)  Muscle weakness   Problem List There are no active problems to display for this  patient.  Enrigue CatenaJonathan Diahn Waidelich, SPT 03/08/2016, 2:14 PM  [facility]  [address] [place], [place], [address] Phone: [phone]   Fax:  [phone]  Name: [name] Gravette MRN: [id number] Date of Birth: [birth year]

## 2016-03-14 ENCOUNTER — Ambulatory Visit: Payer: Medicare HMO | Admitting: Physical Therapy

## 2016-03-14 DIAGNOSIS — M6281 Muscle weakness (generalized): Secondary | ICD-10-CM

## 2016-03-14 DIAGNOSIS — G809 Cerebral palsy, unspecified: Secondary | ICD-10-CM

## 2016-03-15 ENCOUNTER — Encounter: Payer: Self-pay | Admitting: Physical Therapy

## 2016-03-15 NOTE — Therapy (Signed)
[facility] [place], [place], [address] Phone: [phone]   Fax:  [phone]  Pediatric Physical Therapy Treatment  Patient Details  Name: Jody [name] MRN: [id number] Date of Birth: [birth year] Referring Provider: Dr. Velvet BathePamela Warner  Encounter date: 03/14/2016      End of Session - 03/15/16 1217    Visit Number 18   Date for PT Re-Evaluation 05/02/16   Authorization Type [employer] and Medicaid   Authorization Time Period 11/17/2015 - 05/02/2016   Authorization - Visit Number 11   Authorization - Number of Visits 24   PT Start Time 1532   PT Stop Time 1557  2 charges, pt arrived late   PT Time Calculation (min) 25 min   Equipment Utilized During Treatment Orthotics   Activity Tolerance Patient tolerated treatment well   Behavior During Therapy Willing to participate      Past Medical History:  Diagnosis Date  . Hydradenitis    bilateral  . Hydrocephaly   . Seizures (HCC)   . VP (ventriculoperitoneal) shunt status     Past Surgical History:  Procedure Laterality Date  . BOTOX INJECTION    . DENTAL EXAMINATION UNDER ANESTHESIA    . EYE SURGERY    . HAMSTRING LENGTHENING    . shunt replacement  2008   5th shunt for pt.   . TENDON LENGTHENING      There were no vitals filed for this visit.                    Pediatric PT Treatment - 03/15/16 1206      Subjective Information   Patient Comments Jody [name] was excited about trying out her standing wheelchair when Jody [name] (wheelchair vendor) did his home visit).     PT Pediatric Exercise/Activities   Strengthening Activities prone prop on forearms alternating which UE she uses to place puzzle pieces, perturbations in all directions while sitting for core strengthening (increased difficulty when pushed to her right), Trunk rotations to the L to retrive toy x 5 with SBA     Therapeutic Activities   Therapeutic Activity  Details W/c-mat with SBA. Sidelying to sit with moderate assist. Mat to w/c  x 2 slide board transfer.       Pain   Pain Assessment No/denies pain                 Patient Education - 03/15/16 1216    Education Provided Yes   Education Description Discussed prone activities at home.   Person(s) Educated Patient;Mother   Method Education Verbal explanation;Discussed session   Comprehension Verbalized understanding          Peds PT Short Term Goals - 02/23/16 1324      PEDS PT  SHORT TERM GOAL #1   Title Jody [name] will be able to roll from the right and to the left with SBA to assist with positioning in the bed.    Baseline rolling to the left moderate assist at hip with increased assist as activity continues, right SBA but slight CGA with increase fatigue (initial base line had the directions and assist backwards)   Time 6   Period Months   Status On-going     PEDS PT  SHORT TERM GOAL #2   Title Jody [name] will be able to reach to the right with control 3/5 trials to grab her seat belt to don for safety without falling over.    Baseline Mom reports  Jody [name] "flops" over to the right to reach for her seat belt every trial with decreased core control. (as of 7/10, leans into the armrest on the right and required min A to reach for the belt. difficulty to grasp the belt due to coordination and control of the right hand.)   Time 6   Period Months   Status Achieved     PEDS PT  SHORT TERM GOAL #3   Title Jody [name] will be able to increase grip strength on the right UE to at least 20 lbs to increase grip to assist with ADL activities and grasping objects such as seat belt.    Baseline grip on the right 10 lbs, left 25 lbs.    Time 6   Period Months   Status Achieved     PEDS PT  SHORT TERM GOAL #6   Title Jody [name] will be able to independently lift her legs onto the mat when transitioning from sit to supine.   Baseline no change with slight assist with the left, min A with the right LE    Time 6   Period Months   Status On-going     PEDS PT  SHORT TERM GOAL #7   Title Jody [name] will be able to reach to the right and back to midline without falling onto her forearm to demonstrate improved core strength to help with trunk control during ADLS and sitting balance with reaching to the right and car rides in wheelchair.    Baseline requires stabilization with the use of her left hand to reach behind with the right UE,  x 1 with slight assist but did not rest her elbow on the mat.    Time 6   Period Months   Status On-going     PEDS PT  SHORT TERM GOAL #8   Title Jody [name] and mom will be able to indicate community activities to initate a maintenance program after discharge.     Baseline not consistent but report a membership at the [facility]    Time 6   Period Months   Status New          Peds PT Long Term Goals - 02/23/16 1331      PEDS PT  LONG TERM GOAL #1   Title Jody [name] will be able to assist with all transfers throughout the day and stand to help improve her ADLs   Time 6   Period Months   Status On-going          Plan - 03/15/16 1218    Clinical Impression Statement Jody [name] again did well in the prone position and was able to alternate which side she leaned on while using her other UE to place a puzzle piece. She needed extra time for this due to weakness. She did well with perturbations in sitting and had difficulty when pushed to her R side.   PT plan prone activities and rolling      Patient will benefit from skilled therapeutic intervention in order to improve the following deficits and impairments:  Decreased interaction with peers, Decreased ability to perform or assist with self-care, Decreased ability to maintain good postural alignment, Decreased function at home and in the community, Decreased abililty to observe the enviornment, Decreased sitting balance, Decreased standing balance  Visit Diagnosis: Cerebral palsy, unspecified type (HCC)  Muscle  weakness   Problem List There are no active problems to display for this patient.  Jody [name], SPT 03/15/2016, 12:22 PM  [facility] [place], [place], [address] Phone: [phone]   Fax:  [phone]  Name: Shavonn Convey MRN: [id number] Date of Birth: [birth year]

## 2016-03-21 ENCOUNTER — Ambulatory Visit: Payer: Medicare HMO | Admitting: Physical Therapy

## 2016-03-21 DIAGNOSIS — Z7409 Other reduced mobility: Secondary | ICD-10-CM

## 2016-03-21 DIAGNOSIS — M6281 Muscle weakness (generalized): Secondary | ICD-10-CM

## 2016-03-21 DIAGNOSIS — G809 Cerebral palsy, unspecified: Secondary | ICD-10-CM | POA: Diagnosis not present

## 2016-03-23 ENCOUNTER — Encounter: Payer: Self-pay | Admitting: Physical Therapy

## 2016-03-23 NOTE — Therapy (Signed)
[facility] [address] [place], [place], [address] Phone: [phone]   Fax:  [phone]  Pediatric Physical Therapy Treatment  Patient Details  Name: Jody Sheareraylor Olazabal MRN: [id number] Date of Birth: 03/11/1996 Referring Provider: Dr. Velvet BathePamela Warner  Encounter date: 03/21/2016      End of Session - 03/23/16 1217    Visit Number 19   Date for PT Re-Evaluation 05/02/16   Authorization Type [employer] and Medicaid   Authorization Time Period 11/17/2015 - 05/02/2016   Authorization - Visit Number 12   Authorization - Number of Visits 24   PT Start Time 1520   PT Stop Time 1600   PT Time Calculation (min) 40 min   Equipment Utilized During Treatment Orthotics   Activity Tolerance Patient tolerated treatment well   Behavior During Therapy Willing to participate      Past Medical History:  Diagnosis Date  . Hydradenitis    bilateral  . Hydrocephaly   . Seizures (HCC)   . VP (ventriculoperitoneal) shunt status     Past Surgical History:  Procedure Laterality Date  . BOTOX INJECTION    . DENTAL EXAMINATION UNDER ANESTHESIA    . EYE SURGERY    . HAMSTRING LENGTHENING    . shunt replacement  2008   5th shunt for pt.   . TENDON LENGTHENING      There were no vitals filed for this visit.                    Pediatric PT Treatment - 03/23/16 1214      Subjective Information   Patient Comments [name]      PT Pediatric Exercise/Activities   Strengthening Activities Modified sit up sitting on mat 2 x 10 with minimal assist required after 6 trials.      Balance Activities Performed   Balance Details sitting balance with lateral reach to challenge core sitting on edge of mat.      Therapeutic Activities   Therapeutic Activity Details w/c to mat transition min-mod assist to transition from side lying to sit from right to left. Sidelying  transition repeated x 3 from muscle strengthening. Slide board transition mat to w/c x 2     Pain   Pain Assessment No/denies pain                 Patient Education - 03/23/16 1216    Education Provided Yes   Education Description Discussed session with mom and Jody [name] for carryover   [employer] HotelsPerson(s) Educated Patient;Mother   Method Education Verbal explanation;Discussed session   Comprehension Verbalized understanding          Peds PT Short Term Goals - 02/23/16 1324      PEDS PT  SHORT TERM GOAL #1   Title Jody [name] will be able to roll from the right and to the left with SBA to assist with positioning in the bed.    Baseline rolling to the left moderate assist at hip with increased assist as activity continues, right SBA but slight CGA with increase fatigue (initial base line had the directions and assist backwards)   Time 6   Period Months   Status On-going     PEDS PT  SHORT TERM GOAL #2   Title Jody [name] will be able to reach to the right with control 3/5 trials to grab her seat belt to don for safety without falling over.  Baseline Mom reports Jody [name] "flops" over to the right to reach for her seat belt every trial with decreased core control. (as of 7/10, leans into the armrest on the right and required min A to reach for the belt. difficulty to grasp the belt due to coordination and control of the right hand.)   Time 6   Period Months   Status Achieved     PEDS PT  SHORT TERM GOAL #3   Title Jody [name] will be able to increase grip strength on the right UE to at least 20 lbs to increase grip to assist with ADL activities and grasping objects such as seat belt.    Baseline grip on the right 10 lbs, left 25 lbs.    Time 6   Period Months   Status Achieved     PEDS PT  SHORT TERM GOAL #6   Title Jody [name] will be able to independently lift her legs onto the mat when transitioning from sit to supine.   Baseline no change with slight assist with the left, min A with the right LE    Time 6   Period Months   Status On-going     PEDS PT  SHORT TERM GOAL #7   Title Jody [name] will be able to reach to the right and back to midline without falling onto her forearm to demonstrate improved core strength to help with trunk control during ADLS and sitting balance with reaching to the right and car rides in wheelchair.    Baseline requires stabilization with the use of her left hand to reach behind with the right UE,  x 1 with slight assist but did not rest her elbow on the mat.    Time 6   Period Months   Status On-going     PEDS PT  SHORT TERM GOAL #8   Title Jody [name] and mom will be able to indicate community activities to initate a maintenance program after discharge.     Baseline not consistent but [facility]    Time 6   Period Months   Status New          Peds PT Long Term Goals - 02/23/16 1331      PEDS PT  LONG TERM GOAL #1   Title Jody [name] will be able to assist with all transfers throughout the day and stand to help improve her ADLs   Time 6   Period Months   Status On-going          Plan - 03/23/16 1218    Clinical Impression Statement Jody [name] continues to require min-moderate assist to transition to sit from side lying position.  Muscle fatigue noted with modified sit up activity.  She will receive Botox for her hamstrings due to increased tightness.  Dr. Adah Salvageampion agreed with the new wheelchair recommendations with standing option to increase independence and compliance with standing program.    PT plan Prone activities and rolling.       Patient will benefit from skilled therapeutic intervention in order to improve the following deficits and impairments:  Decreased interaction with peers, Decreased ability to perform or assist with self-care, Decreased ability to maintain good postural alignment, Decreased function at home and in the community, Decreased abililty to observe the enviornment, Decreased sitting balance, Decreased standing  balance  Visit Diagnosis: Muscle weakness  Decreased mobility and endurance   Problem List There are no active problems to display for this patient.   Jody [name],  PT 03/23/16 12:25 PM Phone: [phone] Fax: [phone]  [facility] [address] [place], [place], [address] Phone: [phone]   Fax:  [phone]  Name: Jody [name] MRN: [id number] Date of Birth: 1995/09/24

## 2016-03-28 ENCOUNTER — Ambulatory Visit: Payer: Medicare HMO | Admitting: Physical Therapy

## 2016-03-28 DIAGNOSIS — G809 Cerebral palsy, unspecified: Secondary | ICD-10-CM | POA: Diagnosis not present

## 2016-03-28 DIAGNOSIS — M6281 Muscle weakness (generalized): Secondary | ICD-10-CM

## 2016-03-29 ENCOUNTER — Encounter: Payer: Self-pay | Admitting: Physical Therapy

## 2016-03-29 NOTE — Therapy (Addendum)
[facility] [address] [place], [place], [address] Phone: [phone]   Fax:  [phone]  Pediatric Physical Therapy Treatment  Patient Details  Name: Jody [name] MRN: [id number] Date of Birth: Mar 31, 1996 Referring Provider: Dr. Velvet BathePamela Warner  Encounter date: 03/28/2016      End of Session - 03/29/16 0901    Visit Number 20   Date for PT Re-Evaluation 05/02/16   Authorization Type [employer] and Medicaid   Authorization Time Period 11/17/2015 - 05/02/2016   Authorization - Visit Number 13   Authorization - Number of Visits 24   PT Start Time 1530   PT Stop Time 1600  late arrival   PT Time Calculation (min) 30 min   Activity Tolerance Patient tolerated treatment well   Behavior During Therapy Willing to participate      Past Medical History:  Diagnosis Date  . Hydradenitis    bilateral  . Hydrocephaly   . Seizures (HCC)   . VP (ventriculoperitoneal) shunt status     Past Surgical History:  Procedure Laterality Date  . BOTOX INJECTION    . DENTAL EXAMINATION UNDER ANESTHESIA    . EYE SURGERY    . HAMSTRING LENGTHENING    . shunt replacement  2008   5th shunt for pt.   . TENDON LENGTHENING      There were no vitals filed for this visit.                    Pediatric PT Treatment - 03/29/16 0857      Subjective Information   Patient Comments Mom reports Jody [name] [facility] but can not go today.      PT Pediatric Exercise/Activities   Strengthening Activities Grip strengthening with velcro up and down and side to side crossing midline. Lateral reaching for cones on the floor in w/c  SBA to CGA to the right. Hip flexion 2 x 10 each extremity in the chair.      Seated Stepper   Other Endurance Exercise/Activities UBE level 2 10 minutes with cues to motvate to continue after 5 minutes completed.      Pain   Pain Assessment No/denies pain                  Patient Education - 03/29/16 0901    Education Provided Yes   Education Description Discussed session with mom and Jody [name] for carryover   [employer] HotelsPerson(s) Educated Patient;Mother   Method Education Verbal explanation;Discussed session   Comprehension Verbalized understanding          Peds PT Short Term Goals - 02/23/16 1324      PEDS PT  SHORT TERM GOAL #1   Title Jody [name] will be able to roll from the right and to the left with SBA to assist with positioning in the bed.    Baseline rolling to the left moderate assist at hip with increased assist as activity continues, right SBA but slight CGA with increase fatigue (initial base line had the directions and assist backwards)   Time 6   Period Months   Status On-going     PEDS PT  SHORT TERM GOAL #2   Title Jody [name] will be able to reach to the right with control 3/5 trials to grab her seat belt to don for safety without falling over.    Baseline Mom reports Jody [name] "flops" over to the right to reach for her seat belt every  trial with decreased core control. (as of 7/10, leans into the armrest on the right and required min A to reach for the belt. difficulty to grasp the belt due to coordination and control of the right hand.)   Time 6   Period Months   Status Achieved     PEDS PT  SHORT TERM GOAL #3   Title Jody [name] will be able to increase grip strength on the right UE to at least 20 lbs to increase grip to assist with ADL activities and grasping objects such as seat belt.    Baseline grip on the right 10 lbs, left 25 lbs.    Time 6   Period Months   Status Achieved     PEDS PT  SHORT TERM GOAL #6   Title Jody [name] will be able to independently lift her legs onto the mat when transitioning from sit to supine.   Baseline no change with slight assist with the left, min A with the right LE   Time 6   Period Months   Status On-going     PEDS PT  SHORT TERM GOAL #7   Title Jody [name] will be able to reach to the right and  back to midline without falling onto her forearm to demonstrate improved core strength to help with trunk control during ADLS and sitting balance with reaching to the right and car rides in wheelchair.    Baseline requires stabilization with the use of her left hand to reach behind with the right UE,  x 1 with slight assist but did not rest her elbow on the mat.    Time 6   Period Months   Status On-going     PEDS PT  SHORT TERM GOAL #8   Title Jody [name] and mom will be able to indicate community activities to initate a maintenance program after discharge.     Baseline not consistent but [facility]    Time 6   Period Months   Status New          Peds PT Long Term Goals - 02/23/16 1331      PEDS PT  LONG TERM GOAL #1   Title Jody [name] will be able to assist with all transfers throughout the day and stand to help improve her ADLs   Time 6   Period Months   Status On-going          Plan - 03/29/16 0902    Clinical Impression Statement Due to late arrival PT completed in W/C.  Fatigue noted with the UBE requiring motivation to continue task.  Great lateral reaches to the left but increased effort to reach down on the right.    PT plan Prone and rolling activities      Patient will benefit from skilled therapeutic intervention in order to improve the following deficits and impairments:  Decreased interaction with peers, Decreased ability to perform or assist with self-care, Decreased ability to maintain good postural alignment, Decreased function at home and in the community, Decreased abililty to observe the enviornment, Decreased sitting balance, Decreased standing balance  Visit Diagnosis: Muscle weakness   Problem List There are no active problems to display for this patient.  Late Entry G-code Clinical Judgement - Changing and Maintaining Body Position Current - CM Goal Sherron Flemings- CL  Callaway Hailes, PT 03/29/16 9:05 AM Phone: [phone] Fax:  [phone]  [facility] [address] [place], [place], [address] Phone: [phone]  Fax:  [phone]  Name: Jody [name] MRN: [id number] Date of Birth: Sep 02, 1995

## 2016-04-04 ENCOUNTER — Ambulatory Visit: Payer: Medicare HMO | Admitting: Physical Therapy

## 2016-04-11 ENCOUNTER — Ambulatory Visit: Payer: Medicare HMO | Admitting: Physical Therapy

## 2016-04-18 ENCOUNTER — Ambulatory Visit: Payer: Medicare HMO | Admitting: Physical Therapy

## 2016-05-09 ENCOUNTER — Ambulatory Visit: Payer: Medicare HMO | Admitting: Physical Therapy

## 2016-05-16 ENCOUNTER — Ambulatory Visit: Payer: Medicare HMO | Attending: Pediatrics | Admitting: Physical Therapy

## 2016-05-16 DIAGNOSIS — Z7409 Other reduced mobility: Secondary | ICD-10-CM | POA: Diagnosis present

## 2016-05-16 DIAGNOSIS — R2681 Unsteadiness on feet: Secondary | ICD-10-CM | POA: Diagnosis present

## 2016-05-16 DIAGNOSIS — M6281 Muscle weakness (generalized): Secondary | ICD-10-CM | POA: Insufficient documentation

## 2016-05-16 DIAGNOSIS — M2560 Stiffness of unspecified joint, not elsewhere classified: Secondary | ICD-10-CM | POA: Diagnosis present

## 2016-05-16 DIAGNOSIS — G809 Cerebral palsy, unspecified: Secondary | ICD-10-CM | POA: Insufficient documentation

## 2016-05-18 ENCOUNTER — Encounter: Payer: Self-pay | Admitting: Physical Therapy

## 2016-05-18 NOTE — Therapy (Signed)
[place], [place], [address] Phone: [phone]   Fax:  [phone]  Pediatric Physical Therapy Treatment  Patient Details  Name: Jody [name] MRN: [id number] Date of Birth: 12-13-1995 Referring Provider: Dr. Alba [name]  Encounter date: 05/16/2016      End of Session - 05/18/16 1323    Visit Number 21   Date for PT Re-Evaluation 05/02/16   Authorization Type [employer] and Medicaid   Authorization Time Period 11/17/2015 - 05/02/2016   Authorization - Visit Number 14   Authorization - Number of Visits 24   PT Start Time 0511   PT Stop Time 1550  requested to leave early   PT Time Calculation (min) 35 min   Equipment Utilized During Treatment Orthotics   Activity Tolerance Patient tolerated treatment well   Behavior During Therapy Willing to participate      Past Medical History:  Diagnosis Date  . Hydradenitis    bilateral  . Hydrocephaly   . Seizures ([place])   . VP (ventriculoperitoneal) shunt status     Past Surgical History:  Procedure Laterality Date  . BOTOX INJECTION    . DENTAL EXAMINATION UNDER ANESTHESIA    . EYE SURGERY    . HAMSTRING LENGTHENING    . shunt replacement  2008   5th shunt for pt.   . TENDON LENGTHENING      There were no vitals filed for this visit.      Pediatric PT Subjective Assessment - 05/18/16 0001    Medical Diagnosis Cerebral Palsy   Referring Provider Dr. Alba [name]   Onset Date Birth                      Pediatric PT Treatment - 05/18/16 1322      Subjective Information   Patient Comments Jody [name] asked to leave early to pick up son.      Balance Activities Performed   Balance Details Reaching lateral to the right and posterior back to midline x 8 Cross midline right to left to place pegs in board.      Therapeutic Activities   Therapeutic Activity Details Transition from w/c to mat with only min assist to get  her [name] on the mat.  Transitions mat to w/c with Jody [name] performing stand pivot. Rolling to the right with SBA, left min to moderate assist. Transition from sidelying to sit with min A                  Patient Education - 05/18/16 1323    Education Provided Yes   Education Description Discussed goals and progress with Jody [name] and Jody [name]   Person(s) Educated Patient;Mother   Method Education Verbal explanation;Observed session   Comprehension Verbalized understanding          Peds PT Short Term Goals - 05/18/16 1324      PEDS PT  SHORT TERM GOAL #1   Title Jody [name] will be able to roll from the right and to the left with SBA to assist with positioning in the bed.    Baseline as of 05/16/16, goal met to the right, requires min-mod assist to roll to the left.    Time 6   Period Months   Status On-going     PEDS PT  SHORT TERM GOAL #2   Title Jody [name] will be able to tolerate stander at home at least 5 days per week 30-45 minutes  Baseline has not been in a stander at home due to family unable to transfer her.  New home aid is trained to transfer patient to stander and will initiate a new home program.    Time 6   Period Months     PEDS PT  SHORT TERM GOAL #3   Title Jody [name] will be able to perform the [name] with minimal use of UE assist level 2 for at least 5 minutes to demonstrate improved [name] strength   Baseline unable to move without assist   Time 6   Period Months   Status New     PEDS PT  SHORT TERM GOAL #6   Title Jody [name] will be able to independently lift her legs onto the mat when transitioning from sit to supine.   Baseline no change with slight assist with the left, min A with the right [name]   Time 6   Period Months   Status On-going     PEDS PT  SHORT TERM GOAL #7   Title Jody [name] will be able to reach to the right and back to midline without falling onto her forearm to demonstrate improved core strength to help with trunk control during ADLS and sitting balance with  reaching to the right and car rides in wheelchair.    Baseline requires stabilization with the use of her left hand to reach behind with the right UE,  x 1 with slight assist but did not rest her elbow on the mat.    Time 6   Period Months   Status Achieved     PEDS PT  SHORT TERM GOAL #8   Title Jody [name] and Jody [name] will be able to indicate community activities to initate a maintenance program after discharge.     Baseline Jody [name] at least 2 times per week   Time 6   Period Months   Status Achieved          Peds PT Long Term Goals - 05/18/16 1329      PEDS PT  LONG TERM GOAL #1   Title Jody [name] will be able to assist with all transfers throughout the day and stand to help improve her ADLs   Time 6   Period Months   Status On-going          Plan - 05/18/16 1416    Clinical Impression Statement Jody [name] has not made significant improvements in the last 6 months. She has not been in her stander at home due to lack of assist to get her safely into the stander.  Bilateral contractures of her knees.  She will be scheduled to have Botox but is awaiting approval of sit to stand power wheelchair to optimize ROM activities independently. They now have a new home aid and she is able to transition into the stander. Continues to demonstrate difficulty without assist rolling to the left.  Decreased activation of the right lateral trunk muscles to assist.  Jody [name] is interested to work on standing activities but this tolerance has declined since stopping stander at home. She will benefit with skilled therapy to address weakness (weaker on the right side), decreased mobility and balance and decreased ROM.    Rehab Potential Fair   Clinical impairments affecting rehab potential N/A   PT Frequency 1X/week   PT Duration 6 months   PT Treatment/Intervention Therapeutic activities;Therapeutic exercises;Neuromuscular reeducation;Patient/family education;Educational psychologist;Instruction proper posture/body  mechanics;Self-care and home management;Orthotic fitting and training   PT plan see updated goals. right trunk  strengthening, Nustep      Patient will benefit from skilled therapeutic intervention in order to improve the following deficits and impairments:  Decreased interaction with peers, Decreased ability to perform or assist with self-care, Decreased ability to maintain good postural alignment, Decreased function at home and in the community, Decreased abililty to observe the enviornment, Decreased sitting balance, Decreased standing balance  Visit Diagnosis: Cerebral palsy, unspecified type ([place]) - Plan: PT plan of care cert/re-cert  Muscle weakness - Plan: PT plan of care cert/re-cert  Decreased mobility and endurance - Plan: PT plan of care cert/re-cert  Unsteadiness - Plan: PT plan of care cert/re-cert  Stiffness in joint - Plan: PT plan of care cert/re-cert   Problem List There are no active problems to display for this patient.  Zachery Dauer, PT 05/18/16 2:28 PM Phone: [phone] Fax: [facility] [address] [place], [place], [address] Phone: [phone]   Fax:  [phone]  Name: Jody [name] MRN: [id number] Date of Birth: [birth year]

## 2016-05-23 ENCOUNTER — Ambulatory Visit: Payer: Medicare HMO | Admitting: Physical Therapy

## 2016-05-30 ENCOUNTER — Ambulatory Visit: Payer: Medicare HMO | Admitting: Physical Therapy

## 2016-06-06 ENCOUNTER — Ambulatory Visit: Payer: Medicare HMO | Attending: Pediatrics | Admitting: Physical Therapy

## 2016-06-06 DIAGNOSIS — Z7409 Other reduced mobility: Secondary | ICD-10-CM | POA: Diagnosis present

## 2016-06-06 DIAGNOSIS — R2681 Unsteadiness on feet: Secondary | ICD-10-CM | POA: Diagnosis present

## 2016-06-06 DIAGNOSIS — M2560 Stiffness of unspecified joint, not elsewhere classified: Secondary | ICD-10-CM | POA: Insufficient documentation

## 2016-06-06 DIAGNOSIS — M6281 Muscle weakness (generalized): Secondary | ICD-10-CM | POA: Diagnosis present

## 2016-06-06 DIAGNOSIS — G809 Cerebral palsy, unspecified: Secondary | ICD-10-CM

## 2016-06-08 ENCOUNTER — Encounter: Payer: Self-pay | Admitting: Physical Therapy

## 2016-06-08 NOTE — Therapy (Signed)
[place], [place], [address] Phone: [phone]   Fax:  [phone]  Pediatric Physical Therapy Treatment  Patient Details  Name: Jody [name] MRN: [id number] Date of Birth: August 08, 1995 Referring Provider: Dr. Alba Cory  Encounter date: 06/06/2016      End of Session - 06/08/16 0927    Visit Number 22   Date for PT Re-Evaluation 10/14/16   Authorization Type [employer] and Medicaid   Authorization Time Period 05/30/16-10/14/16   Authorization - Visit Number 1   Authorization - Number of Visits 24   PT Start Time 1520   PT Stop Time 1600  late arrival   PT Time Calculation (min) 40 min   Equipment Utilized During Treatment Orthotics   Activity Tolerance Patient tolerated treatment well   Behavior During Therapy Willing to participate      Past Medical History:  Diagnosis Date  . Hydradenitis    bilateral  . Hydrocephaly   . Seizures ([place])   . VP (ventriculoperitoneal) shunt status     Past Surgical History:  Procedure Laterality Date  . BOTOX INJECTION    . DENTAL EXAMINATION UNDER ANESTHESIA    . EYE SURGERY    . HAMSTRING LENGTHENING    . shunt replacement  2008   5th shunt for pt.   . TENDON LENGTHENING      There were no vitals filed for this visit.                    Pediatric PT Treatment - 06/08/16 0916      Subjective Information   Patient Comments Mom reports they have only worked on the [place] just recently because Amanat was sick.      PT Pediatric Exercise/Activities   Strengthening Activities In w/c lateral reaching for cones on the floor for core strengthening. Removed lateral supports and armrests.  No assist required to the left, min-moderate assist especially with the 2nd set.  Midline cross anterior reach of cones while facilitating trunk extension and place cone on floor laterally.      Seated Stepper   Other Endurance  Exercise/Activities UBE 1.2 level 8 minutes     Pain   Pain Assessment No/denies pain                 Patient Education - 06/08/16 0919    Education Provided Yes   Education Description observed w/c activities with lateral reaching for carry over at home.  Encouraged to have a chart with stander days and time tolerated    Person(s) Educated Patient;Mother   Method Education Verbal explanation;Observed session   Comprehension Verbalized understanding          Peds PT Short Term Goals - 05/18/16 1324      PEDS PT  SHORT TERM GOAL #1   Title Ilma will be able to roll from the right and to the left with SBA to assist with positioning in the bed.    Baseline as of 05/16/16, goal met to the right, requires min-mod assist to roll to the left.    Time 6   Period Months   Status On-going     PEDS PT  SHORT TERM GOAL #2   Title Clint will be able to tolerate stander at home at least 5 days per week 30-45 minutes    Baseline has not been in a stander at home due to family unable to transfer her.  New home  aid is trained to transfer patient to stander and will initiate a new home program.    Time 6   Period Months     PEDS PT  SHORT TERM GOAL #3   Title Darline will be able to perform the [name] with minimal use of UE assist level 2 for at least 5 minutes to demonstrate improved LE strength   Baseline unable to move without assist   Time 6   Period Months   Status New     PEDS PT  SHORT TERM GOAL #6   Title Alayziah will be able to independently lift her legs onto the mat when transitioning from sit to supine.   Baseline no change with slight assist with the left, min A with the right LE   Time 6   Period Months   Status On-going     PEDS PT  SHORT TERM GOAL #7   Title Helina will be able to reach to the right and back to midline without falling onto her forearm to demonstrate improved core strength to help with trunk control during ADLS and sitting balance with reaching  to the right and car rides in wheelchair.    Baseline requires stabilization with the use of her left hand to reach behind with the right UE,  x 1 with slight assist but did not rest her elbow on the mat.    Time 6   Period Months   Status Achieved     PEDS PT  SHORT TERM GOAL #8   Title Romina and mom will be able to indicate community activities to initate a maintenance program after discharge.     Baseline [employer] at least 2 times per week   Time 6   Period Months   Status Achieved          Peds PT Long Term Goals - 05/18/16 1329      PEDS PT  LONG TERM GOAL #1   Title Ameliyah will be able to assist with all transfers throughout the day and stand to help improve her ADLs   Time 6   Period Months   Status On-going          Plan - 06/08/16 0931    Clinical Impression Statement Fatigues with lateral reaches to the right and to return to sitting midline.  Moderate assist and increase height of the cones required.  Jermya reports she is in the stander but working on tolerating it more.  She was unable to state the longest time in the stander.  Recommended to document the times and days.    PT plan Ask about the stander and NuStep.       Patient will benefit from skilled therapeutic intervention in order to improve the following deficits and impairments:  Decreased interaction with peers, Decreased ability to perform or assist with self-care, Decreased ability to maintain good postural alignment, Decreased function at home and in the community, Decreased abililty to observe the enviornment, Decreased sitting balance, Decreased standing balance  Visit Diagnosis: Cerebral palsy, unspecified type (HCC)  Muscle weakness   Problem List There are no active problems to display for this patient.   Zachery Dauer, PT 06/08/16 9:34 AM Phone: [phone] Fax: [place],  [place], [address] Phone: [phone]   Fax:  [phone]  Name: Jody [name] MRN: [id number] Date of Birth: August 17, 1995

## 2016-06-13 ENCOUNTER — Ambulatory Visit: Payer: Medicare HMO | Admitting: Physical Therapy

## 2016-06-20 ENCOUNTER — Ambulatory Visit: Payer: Medicare HMO | Admitting: Physical Therapy

## 2016-06-20 DIAGNOSIS — G809 Cerebral palsy, unspecified: Secondary | ICD-10-CM

## 2016-06-20 DIAGNOSIS — M6281 Muscle weakness (generalized): Secondary | ICD-10-CM

## 2016-06-22 ENCOUNTER — Encounter: Payer: Self-pay | Admitting: Physical Therapy

## 2016-06-22 NOTE — Therapy (Signed)
Jody [name], [place], [address] Phone: [phone]   Fax:  [phone]  Pediatric Physical Therapy Treatment  Patient Details  Name: Jody [name] MRN: [id number] Date of Birth: [birth year] Referring Provider: Dr. Alba Cory  Encounter date: 06/20/2016      End of Session - 06/22/16 1245    Visit Number 23   Date for PT Re-Evaluation 10/14/16   Authorization Type [employer] and Medicaid   Authorization Time Period 05/30/16-10/14/16   Authorization - Visit Number 2   Authorization - Number of Visits 24   PT Start Time 1324   PT Stop Time 1600  late arrival   PT Time Calculation (min) 30 min   Equipment Utilized During Treatment Orthotics   Activity Tolerance Patient tolerated treatment well   Behavior During Therapy Willing to participate      Past Medical History:  Diagnosis Date  . Hydradenitis    bilateral  . Hydrocephaly   . Seizures ([place])   . VP (ventriculoperitoneal) shunt status     Past Surgical History:  Procedure Laterality Date  . BOTOX INJECTION    . DENTAL EXAMINATION UNDER ANESTHESIA    . EYE SURGERY    . HAMSTRING LENGTHENING    . shunt replacement  2008   5th shunt for pt.   . TENDON LENGTHENING      There were no vitals filed for this visit.                    Pediatric PT Treatment - 06/22/16 1235      Subjective Information   Patient Comments [name] reports she is sitting on a pillow because of a wound on her bottom.      PT Pediatric Exercise/Activities   Strengthening Activities Lateral reaching for cones left and right with SBA both sides.  2 x 10 cones each side. Ball bumps with noodle chest high. Over head ball throwing cues to extend both extremity.    Self-care Instructed pressure relief techniques.     Pain   Pain Assessment No/denies pain                 Patient Education - 06/22/16 1251    Education Provided  Yes   Education Description Discussed pressure relief in w/c every 2 hours.  Discontinue use of stander due to wound and strap increase pain.  Discussed late arrivals to PT   Person(s) Educated Patient;Mother   Method Education Verbal explanation;Observed session   Comprehension Verbalized understanding          Peds PT Short Term Goals - 05/18/16 1324      PEDS PT  SHORT TERM GOAL #1   Title [name] will be able to roll from the right and to the left with SBA to assist with positioning in the bed.    Baseline as of 05/16/16, goal met to the right, requires min-mod assist to roll to the left.    Time 6   Period Months   Status On-going     PEDS PT  SHORT TERM GOAL #2   Title [name] will be able to tolerate stander at home at least 5 days per week 30-45 minutes    Baseline has not been in a stander at home due to family unable to transfer her.  New home aid is trained to transfer patient to stander and will initiate a new home program.    Time 6  Period Months     PEDS PT  SHORT TERM GOAL #3   Title [name] will be able to perform the [name] with minimal use of UE assist level 2 for at least 5 minutes to demonstrate improved LE strength   Baseline unable to move without assist   Time 6   Period Months   Status New     PEDS PT  SHORT TERM GOAL #6   Title [name] will be able to independently lift her legs onto the mat when transitioning from sit to supine.   Baseline no change with slight assist with the left, min A with the right LE   Time 6   Period Months   Status On-going     PEDS PT  SHORT TERM GOAL #7   Title [name] will be able to reach to the right and back to midline without falling onto her forearm to demonstrate improved core strength to help with trunk control during ADLS and sitting balance with reaching to the right and car rides in wheelchair.    Baseline requires stabilization with the use of her left hand to reach behind with the right UE,  x 1 with slight assist  but did not rest her elbow on the mat.    Time 6   Period Months   Status Achieved     PEDS PT  SHORT TERM GOAL #8   Title [name] and mom will be able to indicate community activities to initate a maintenance program after discharge.     Baseline [employer] at least 2 times per week   Time 6   Period Months   Status Achieved          Peds PT Long Term Goals - 05/18/16 1329      PEDS PT  LONG TERM GOAL #1   Title Casia will be able to assist with all transfers throughout the day and stand to help improve her ADLs   Time 6   Period Months   Status On-going          Plan - 06/22/16 1256    Clinical Impression Statement Stayed in w/c due to late arrival.  Reported sacral wound that has not improved. Recommended a doctor to evaluate it due to increase wound per mom.  Mom is waiting to see a doctor (primary MD) for w/c approval process. Discussed pressure relief at work by reclining her chair every 2 hours and to be out of her chair in non bottom weight bearing positions such as sidelying or prone as much as possible at home.    PT plan NuStep      Patient will benefit from skilled therapeutic intervention in order to improve the following deficits and impairments:  Decreased interaction with peers, Decreased ability to perform or assist with self-care, Decreased ability to maintain good postural alignment, Decreased function at home and in the community, Decreased abililty to observe the enviornment, Decreased sitting balance, Decreased standing balance  Visit Diagnosis: Cerebral palsy, unspecified type (HCC)  Muscle weakness   Problem List There are no active problems to display for this patient.   Zachery Dauer, PT 06/22/16 3:29 PM Phone: [phone] Fax: [facility] [address] [place], [place], [address] Phone: [phone]   Fax:  [phone]  Name: Jody [name] MRN: [id number] Date  of Birth: 31-Oct-1995

## 2016-06-27 ENCOUNTER — Ambulatory Visit: Payer: Medicare HMO

## 2016-06-27 DIAGNOSIS — G809 Cerebral palsy, unspecified: Secondary | ICD-10-CM

## 2016-06-27 DIAGNOSIS — R2681 Unsteadiness on feet: Secondary | ICD-10-CM

## 2016-06-27 DIAGNOSIS — Z7409 Other reduced mobility: Secondary | ICD-10-CM

## 2016-06-27 DIAGNOSIS — M2560 Stiffness of unspecified joint, not elsewhere classified: Secondary | ICD-10-CM

## 2016-06-27 DIAGNOSIS — M6281 Muscle weakness (generalized): Secondary | ICD-10-CM

## 2016-06-28 NOTE — Therapy (Signed)
[place], [place], [address] Phone: [phone]   Fax:  [phone]  Pediatric Physical Therapy Treatment  Patient Details  Name: Jody [name] MRN: [id number] Date of Birth: 09/20/1995 Referring Provider: Dr. Alba Cory  Encounter date: 06/27/2016      End of Session - 06/28/16 1008    Visit Number 24   Date for PT Re-Evaluation 10/14/16   Authorization Type [employer] and Medicaid   Authorization Time Period 05/30/16-10/14/16   Authorization - Visit Number 3   Authorization - Number of Visits 24   PT Start Time 0762   PT Stop Time 1600   PT Time Calculation (min) 45 min   Equipment Utilized During Treatment Orthotics   Activity Tolerance Patient tolerated treatment well   Behavior During Therapy Willing to participate      Past Medical History:  Diagnosis Date  . Hydradenitis    bilateral  . Hydrocephaly   . Seizures ([place])   . VP (ventriculoperitoneal) shunt status     Past Surgical History:  Procedure Laterality Date  . BOTOX INJECTION    . DENTAL EXAMINATION UNDER ANESTHESIA    . EYE SURGERY    . HAMSTRING LENGTHENING    . shunt replacement  2008   5th shunt for pt.   . TENDON LENGTHENING      There were no vitals filed for this visit.                    Pediatric PT Treatment - 06/27/16 1600      Subjective Information   Patient Comments Jody [name] reported that she had been doing some pressure relief at home but still sore on buttock     PT Pediatric Exercise/Activities   Strengthening Activities lateral reaching downward and upward for core strengthening and stability with CGA for safety. Sit ups x15 on incline and with mod A for support and to come fully upright. Ball squeezes x10 for hip adduction strengthening.      Therapeutic Activities   Therapeutic Activity Details Transitioned WC>mat with min A for LE positioning and increased time.  Transfered mat >WC with mod A +2 for safety with SPT (mother assisted). ROlling side to side with min A for LE placement to demonstrated full side weightshift for pressure relief.      Pain   Pain Assessment No/denies pain                 Patient Education - 06/28/16 1008    Education Provided Yes   Education Description Educated on proper rolling (full rolling) for pressure relief every two hours   Person(s) Educated Patient;Mother   Method Education Verbal explanation;Observed session   Comprehension Verbalized understanding          Peds PT Short Term Goals - 05/18/16 1324      PEDS PT  SHORT TERM GOAL #1   Title Jody [name] will be able to roll from the right and to the left with SBA to assist with positioning in the bed.    Baseline as of 05/16/16, goal met to the right, requires min-mod assist to roll to the left.    Time 6   Period Months   Status On-going     PEDS PT  SHORT TERM GOAL #2   Title Jody [name] will be able to tolerate stander at home at least 5 days per week 30-45 minutes    Baseline has not been in a  stander at home due to family unable to transfer her.  New home aid is trained to transfer patient to stander and will initiate a new home program.    Time 6   Period Months     PEDS PT  SHORT TERM GOAL #3   Title Jody [name] will be able to perform the [name] with minimal use of UE assist level 2 for at least 5 minutes to demonstrate improved LE strength   Baseline unable to move without assist   Time 6   Period Months   Status New     PEDS PT  SHORT TERM GOAL #6   Title Jody [name] will be able to independently lift her legs onto the mat when transitioning from sit to supine.   Baseline no change with slight assist with the left, min A with the right LE   Time 6   Period Months   Status On-going     PEDS PT  SHORT TERM GOAL #7   Title Jody [name] will be able to reach to the right and back to midline without falling onto her forearm to demonstrate improved core  strength to help with trunk control during ADLS and sitting balance with reaching to the right and car rides in wheelchair.    Baseline requires stabilization with the use of her left hand to reach behind with the right UE,  x 1 with slight assist but did not rest her elbow on the mat.    Time 6   Period Months   Status Achieved     PEDS PT  SHORT TERM GOAL #8   Title Jody [name] and mom will be able to indicate community activities to initate a maintenance program after discharge.     Baseline [employer] at least 2 times per week   Time 6   Period Months   Status Achieved          Peds PT Long Term Goals - 05/18/16 1329      PEDS PT  LONG TERM GOAL #1   Title Jody [name] will be able to assist with all transfers throughout the day and stand to help improve her ADLs   Time 6   Period Months   Status On-going          Plan - 06/28/16 1008    Clinical Impression Statement Jody [name] worked hard today on reaching and LE therex. She continues to show increased weakness with sit ups and required increased A. Re-educated on importance of pressure relief and to complete full rolls vs just rolling over shoulders and trunk.    PT plan [facility]ep      Patient will benefit from skilled therapeutic intervention in order to improve the following deficits and impairments:  Decreased interaction with peers, Decreased ability to perform or assist with self-care, Decreased ability to maintain good postural alignment, Decreased function at home and in the community, Decreased abililty to observe the enviornment, Decreased sitting balance, Decreased standing balance  Visit Diagnosis: Cerebral palsy, unspecified type (HCC)  Muscle weakness  Decreased mobility and endurance  Unsteadiness  Stiffness in joint   Problem List There are no active problems to display for this patient.   Jacqualyn Posey 06/28/2016, 10:10 AM 06/28/2016 Jacqualyn Posey PTA      [place], [place], [address] Phone: [phone]   Fax:  [phone]  Name: Jody [name] MRN: [id number] Date of Birth: 03-08-1996

## 2016-07-04 ENCOUNTER — Ambulatory Visit: Payer: Medicare HMO | Attending: Pediatrics | Admitting: Physical Therapy

## 2016-07-04 DIAGNOSIS — R2681 Unsteadiness on feet: Secondary | ICD-10-CM

## 2016-07-04 DIAGNOSIS — M6281 Muscle weakness (generalized): Secondary | ICD-10-CM

## 2016-07-04 DIAGNOSIS — Z7409 Other reduced mobility: Secondary | ICD-10-CM

## 2016-07-04 DIAGNOSIS — G809 Cerebral palsy, unspecified: Secondary | ICD-10-CM

## 2016-07-05 ENCOUNTER — Encounter: Payer: Self-pay | Admitting: Physical Therapy

## 2016-07-05 NOTE — Therapy (Signed)
Jody [name], [place], [address] Phone: [phone]   Fax:  [phone]  Pediatric Physical Therapy Treatment  Patient Details  Name: Jody [name] MRN: [id number] Date of Birth: 05-Oct-1995 Referring Provider: Dr. Alba Cory  Encounter date: 07/04/2016      End of Session - 07/05/16 1440    Visit Number 25   Date for PT Re-Evaluation 10/14/16   Authorization Type [employer] and [employer]   Authorization Time Period 05/30/16-10/14/16   Authorization - Visit Number 4   Authorization - Number of Visits 24   PT Start Time 2993   PT Stop Time 1600   PT Time Calculation (min) 45 min   Equipment Utilized During Treatment Orthotics   Activity Tolerance Patient tolerated treatment well   Behavior During Therapy Willing to participate      Past Medical History:  Diagnosis Date  . Hydradenitis    bilateral  . Hydrocephaly   . Seizures ([place])   . VP (ventriculoperitoneal) shunt status     Past Surgical History:  Procedure Laterality Date  . BOTOX INJECTION    . DENTAL EXAMINATION UNDER ANESTHESIA    . EYE SURGERY    . HAMSTRING LENGTHENING    . shunt replacement  2008   5th shunt for pt.   . TENDON LENGTHENING      There were no vitals filed for this visit.                    Pediatric PT Treatment - 07/05/16 1346      Subjective Information   Patient Comments Jody [name] reports her sores are healing.      PT Pediatric Exercise/Activities   Strengthening Activities prone on mat table with cues to prop on forearms. Cues to place forearms inline with shoulders vs abducted laterally.      Balance Activities Performed   Balance Details Sitting balance with lateral reaching for objects to the side. right to left min A-SBA.       Therapeutic Activities   Therapeutic Activity Details Transition w/c -prone with SBA, Sidelying to sit with min-moderate assist today.       Pain   Pain Assessment No/denies pain                 Patient Education - 07/05/16 1439    Education Provided Yes   Education Description Prone on bed with forearms in line with shoulders.  Recommended to reinforce bed softeness with forearms on a pillow   Person(s) Educated Patient;Mother   Method Education Verbal explanation;Observed session   Comprehension Verbalized understanding          Peds PT Short Term Goals - 05/18/16 1324      PEDS PT  SHORT TERM GOAL #1   Title Jody [name] will be able to roll from the right and to the left with SBA to assist with positioning in the bed.    Baseline as of 05/16/16, goal met to the right, requires min-mod assist to roll to the left.    Time 6   Period Months   Status On-going     PEDS PT  SHORT TERM GOAL #2   Title Jody [name] will be able to tolerate stander at home at least 5 days per week 30-45 minutes    Baseline has not been in a stander at home due to family unable to transfer her.  New home aid is trained to transfer patient to [place]  and will initiate a new home program.    Time 6   Period Months     PEDS PT  SHORT TERM GOAL #3   Title Jody [name] will be able to perform the [name] with minimal use of UE assist level 2 for at least 5 minutes to demonstrate improved LE strength   Baseline unable to move without assist   Time 6   Period Months   Status New     PEDS PT  SHORT TERM GOAL #6   Title Jody [name] will be able to independently lift her legs onto the mat when transitioning from sit to supine.   Baseline no change with slight assist with the left, min A with the right LE   Time 6   Period Months   Status On-going     PEDS PT  SHORT TERM GOAL #7   Title Jody [name] will be able to reach to the right and back to midline without falling onto her forearm to demonstrate improved core strength to help with trunk control during ADLS and sitting balance with reaching to the right and car rides in wheelchair.    Baseline requires  stabilization with the use of her left hand to reach behind with the right UE,  x 1 with slight assist but did not rest her elbow on the mat.    Time 6   Period Months   Status Achieved     PEDS PT  SHORT TERM GOAL #8   Title Jody [name] and mom will be able to indicate community activities to initate a maintenance program after discharge.     Baseline [employer] at least 2 times per week   Time 6   Period Months   Status Achieved          Peds PT Long Term Goals - 05/18/16 1329      PEDS PT  LONG TERM GOAL #1   Title Jody [name] will be able to assist with all transfers throughout the day and stand to help improve her ADLs   Time 6   Period Months   Status On-going          Plan - 07/05/16 1504    Clinical Impression Statement Eutha reports bottom sore is healing well.  Mom reported w/c Jody [name] is on hold until they get a script from a primary physician.  Dr. Suzan Slick is considered a specialist since she is now 21 years old. Mom is trying to find a doctor who is under [facility] and make an appointment for a face to face appointment.    PT plan [facility]      Patient will benefit from skilled therapeutic intervention in order to improve the following deficits and impairments:  Decreased interaction with peers, Decreased ability to perform or assist with self-care, Decreased ability to maintain good postural alignment, Decreased function at home and in the community, Decreased abililty to observe the enviornment, Decreased sitting balance, Decreased standing balance  Visit Diagnosis: Cerebral palsy, unspecified type (HCC)  Muscle weakness  Decreased mobility and endurance  Unsteadiness   Problem List There are no active problems to display for this patient.  Zachery Dauer, PT 07/05/16 3:09 PM Phone: [phone] Fax: [facility] [address] [place], [place], [address] Phone: [phone]   Fax:   [phone]  Name: Jody [name] MRN: [id number] Date of Birth: [birth year]

## 2016-07-11 ENCOUNTER — Ambulatory Visit: Payer: Medicare HMO

## 2016-07-18 ENCOUNTER — Ambulatory Visit: Payer: Medicare HMO | Admitting: Physical Therapy

## 2016-07-18 DIAGNOSIS — G809 Cerebral palsy, unspecified: Secondary | ICD-10-CM

## 2016-07-18 DIAGNOSIS — M6281 Muscle weakness (generalized): Secondary | ICD-10-CM

## 2016-07-19 ENCOUNTER — Encounter: Payer: Self-pay | Admitting: Physical Therapy

## 2016-07-19 NOTE — Therapy (Signed)
[place], [place], [address] Phone: [phone]   Fax:  [phone]  Pediatric Physical Therapy Treatment  Patient Details  Name: Jody [name] MRN: [id number] Date of Birth: [birth year] Referring Provider: Dr. Alba Cory  Encounter date: 07/18/2016      End of Session - 07/19/16 1321    Visit Number 26   Date for PT Re-Evaluation 10/14/16   Authorization Type [employer] and Medicaid   Authorization Time Period 05/30/16-10/14/16   Authorization - Visit Number 5   Authorization - Number of Visits 24   PT Start Time 6237   PT Stop Time 1600   PT Time Calculation (min) 30 min   Equipment Utilized During Treatment Orthotics   Activity Tolerance Patient tolerated treatment well   Behavior During Therapy Willing to participate      Past Medical History:  Diagnosis Date  . Hydradenitis    bilateral  . Hydrocephaly   . Seizures ([place])   . VP (ventriculoperitoneal) shunt status     Past Surgical History:  Procedure Laterality Date  . BOTOX INJECTION    . DENTAL EXAMINATION UNDER ANESTHESIA    . EYE SURGERY    . HAMSTRING LENGTHENING    . shunt replacement  2008   5th shunt for pt.   . TENDON LENGTHENING      There were no vitals filed for this visit.                    Pediatric PT Treatment - 07/19/16 1316      Subjective Information   Patient Comments Jody [name] reports difficulty with prone activities at home.      PT Pediatric Exercise/Activities   Strengthening Activities Reaching anterior return to erect sitting while in w/c.  Theraband UE shoulder flexion and abduction over head.  Horizontal abduction cues to increase range.      Seated Stepper   Other Endurance Exercise/Activities UBE 1.2 level 8 minutes     Pain   Pain Assessment No/denies pain                 Patient Education - 07/19/16 1319    Education Provided Yes   Education  Description Prone on bed with forearms in line with shoulders.  Recommended to reinforce bed softeness with forearms on a pillow. practice rolling in the bed.    Person(s) Educated Patient;Mother   Method Education Verbal explanation;Observed session   Comprehension Verbalized understanding          Peds PT Short Term Goals - 05/18/16 1324      PEDS PT  SHORT TERM GOAL #1   Title Jody [name] will be able to roll from the right and to the left with SBA to assist with positioning in the bed.    Baseline as of 05/16/16, goal met to the right, requires min-mod assist to roll to the left.    Time 6   Period Months   Status On-going     PEDS PT  SHORT TERM GOAL #2   Title Jody [name] will be able to tolerate stander at home at least 5 days per week 30-45 minutes    Baseline has not been in a stander at home due to family unable to transfer her.  New home aid is trained to transfer patient to [place] and will initiate a new home program.    Time 6   Period Months     PEDS PT  SHORT TERM GOAL #3   Title Jody [name] will be able to perform the [name] with minimal use of UE assist level 2 for at least 5 minutes to demonstrate improved LE strength   Baseline unable to move without assist   Time 6   Period Months   Status New     PEDS PT  SHORT TERM GOAL #6   Title Jody [name] will be able to independently lift her legs onto the mat when transitioning from sit to supine.   Baseline no change with slight assist with the left, min A with the right LE   Time 6   Period Months   Status On-going     PEDS PT  SHORT TERM GOAL #7   Title Jody [name] will be able to reach to the right and back to midline without falling onto her forearm to demonstrate improved core strength to help with trunk control during ADLS and sitting balance with reaching to the right and car rides in wheelchair.    Baseline requires stabilization with the use of her left hand to reach behind with the right UE,  x 1 with slight assist but did not  rest her elbow on the mat.    Time 6   Period Months   Status Achieved     PEDS PT  SHORT TERM GOAL #8   Title Jody [name] and mom will be able to indicate community activities to initate a maintenance program after discharge.     Baseline [employer] at least 2 times per week   Time 6   Period Months   Status Achieved          Peds PT Long Term Goals - 05/18/16 1329      PEDS PT  LONG TERM GOAL #1   Title Jody [name] will be able to assist with all transfers throughout the day and stand to help improve her ADLs   Time 6   Period Months   Status On-going          Plan - 07/19/16 1322    Clinical Impression Statement Mom reported she has a MD appointment 08/08/16.  Reported difficulty with prone skills because the bed is soft. recommended to reinforce with pillow and prop on forearms.     PT plan Prone and rolling.       Patient will benefit from skilled therapeutic intervention in order to improve the following deficits and impairments:  Decreased interaction with peers, Decreased ability to perform or assist with self-care, Decreased ability to maintain good postural alignment, Decreased function at home and in the community, Decreased abililty to observe the enviornment, Decreased sitting balance, Decreased standing balance  Visit Diagnosis: Cerebral palsy, unspecified type (HCC)  Muscle weakness   Problem List There are no active problems to display for this patient.  Zachery Dauer, PT 07/19/16 1:25 PM Phone: [phone] Fax: [facility] [address] [place], [place], [address] Phone: [phone]   Fax:  [phone]  Name: Jody [name] MRN: [id number] Date of Birth: 11-03-1995

## 2016-07-25 ENCOUNTER — Ambulatory Visit: Payer: Medicare HMO

## 2016-07-31 IMAGING — CR DG ABDOMEN ACUTE W/ 1V CHEST
4 series · 4 of 4 positions shown · non-contrast
Comparison: Chest and abdominal radiographs performed 07/26/2014

CLINICAL DATA: Acute onset of lower abdominal pain and
constipation. Initial encounter.

EXAM:
DG ABDOMEN ACUTE W/ 1V CHEST

[w abdomen decub (1 of 2)]
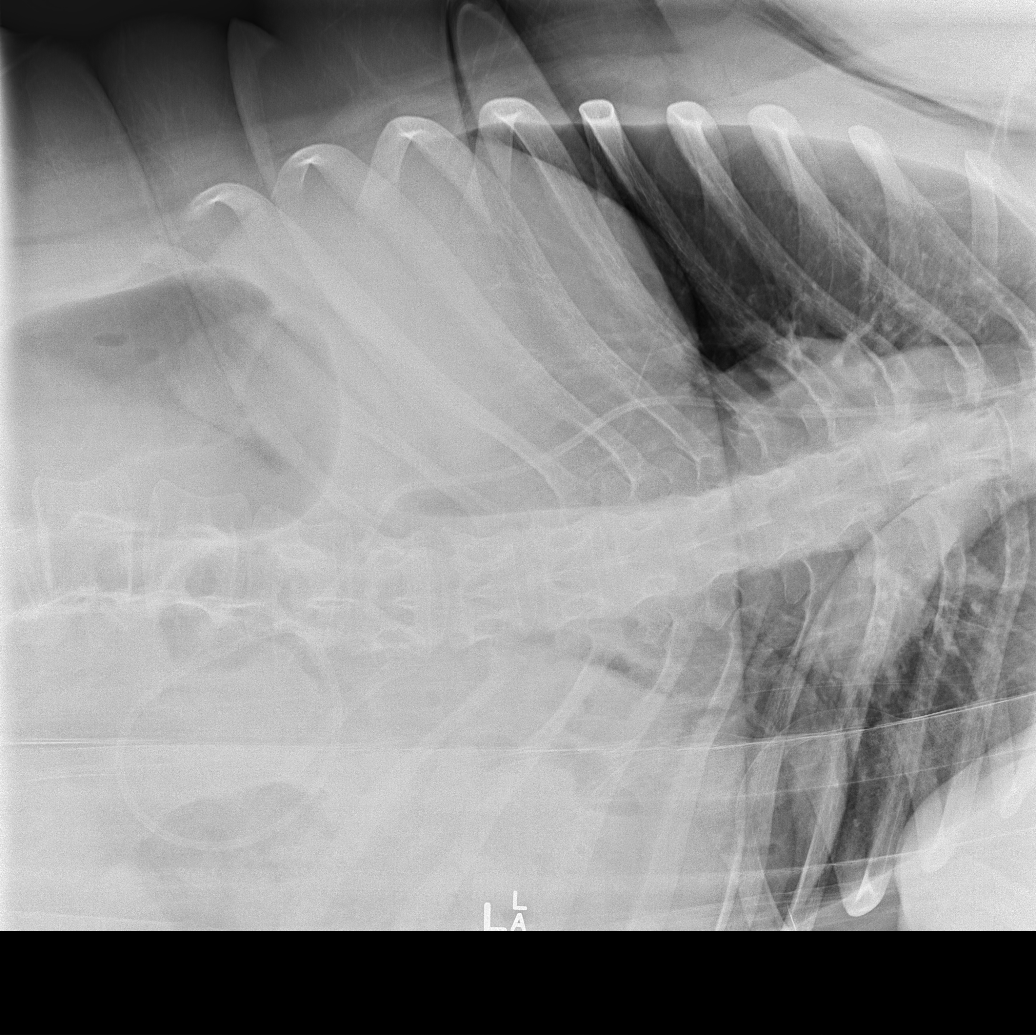

[w abdomen decub (2 of 2)]
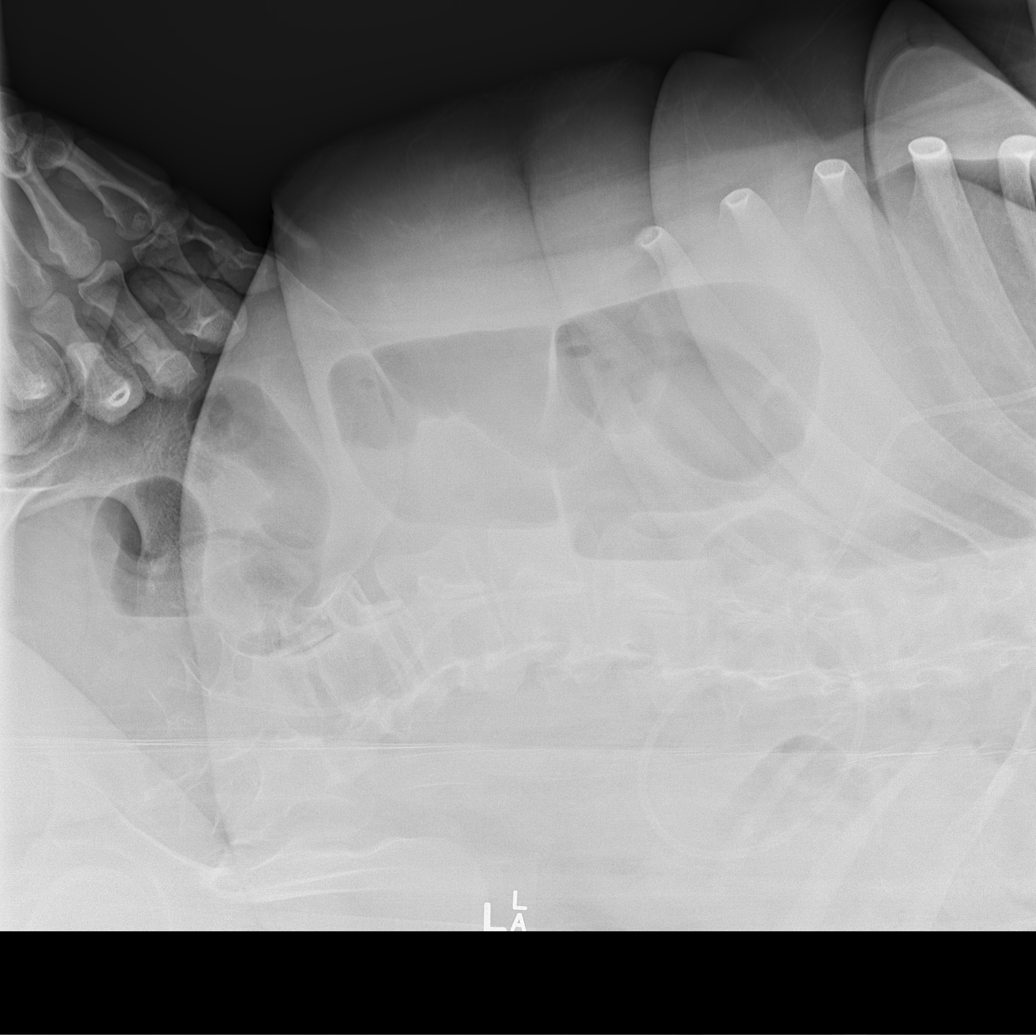

[x abdomen supine]
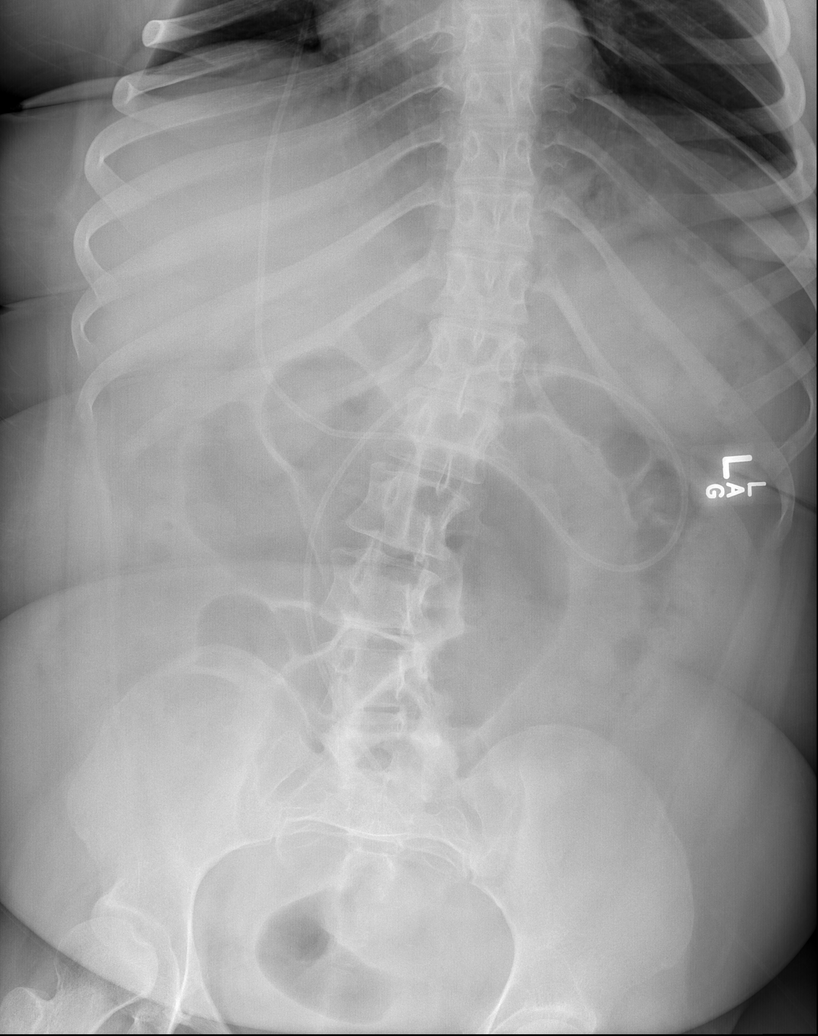

[x chest ap]
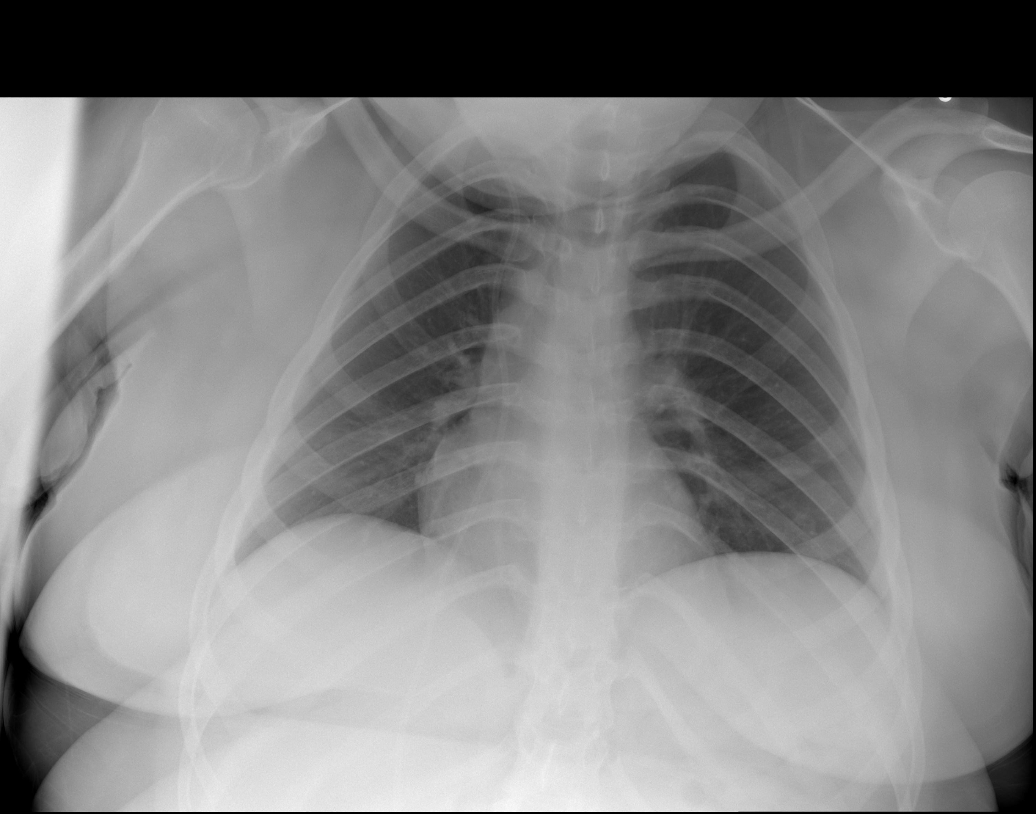

[4 of 4 positions shown; findings below may reference images not displayed]

FINDINGS: The lungs are well-aerated and clear. There is no evidence of focal
opacification, pleural effusion or pneumothorax. The
cardiomediastinal silhouette is within normal limits.

There is distention of small bowel loops, though air and stool are
still seen in the colon. No free intra-abdominal air is identified
on the provided decubitus view.

The visualized portions of the patient's right-sided
ventriculoperitoneal shunt appear grossly intact, coiling within the
abdomen and ending overlying the lower abdomen.

No acute osseous abnormalities are seen; the sacroiliac joints are
unremarkable in appearance.
IMPRESSION: 1. Distention of small-bowel loops may reflect small bowel
dysmotility, without definite evidence of distal obstruction. No
free intra-abdominal air seen.
2. No acute cardiopulmonary process identified.
3. Visualized portions of the right-sided ventriculoperitoneal shunt
appear grossly intact.

## 2016-08-01 ENCOUNTER — Ambulatory Visit: Payer: Medicare HMO | Attending: Pediatrics | Admitting: Physical Therapy

## 2016-08-01 ENCOUNTER — Encounter: Payer: Self-pay | Admitting: Physical Therapy

## 2016-08-01 DIAGNOSIS — Z7409 Other reduced mobility: Secondary | ICD-10-CM | POA: Diagnosis present

## 2016-08-01 DIAGNOSIS — G809 Cerebral palsy, unspecified: Secondary | ICD-10-CM | POA: Insufficient documentation

## 2016-08-01 DIAGNOSIS — M6281 Muscle weakness (generalized): Secondary | ICD-10-CM | POA: Diagnosis present

## 2016-08-02 NOTE — Therapy (Signed)
[place], [place], [address] Phone: [phone]   Fax:  [phone]  Pediatric Physical Therapy Treatment  Patient Details  Name: Jody [name] MRN: [id number] Date of Birth: April 05, 1996 Referring Provider: Dr. Alba Cory  Encounter date: 08/01/2016      End of Session - 08/02/16 0930    Visit Number 27   Date for PT Re-Evaluation 10/14/16   Authorization Type [employer] and Medicaid   Authorization Time Period 05/30/16-10/14/16   Authorization - Visit Number 6   Authorization - Number of Visits 24   PT Start Time 9476   PT Stop Time 5465   PT Time Calculation (min) 45 min   Equipment Utilized During Treatment --  no orthotics donned. In a rush per [name] to don   Activity Tolerance Patient tolerated treatment well   Behavior During Therapy Willing to participate      Past Medical History:  Diagnosis Date  . Hydradenitis    bilateral  . Hydrocephaly   . Seizures ([place])   . VP (ventriculoperitoneal) shunt status     Past Surgical History:  Procedure Laterality Date  . BOTOX INJECTION    . DENTAL EXAMINATION UNDER ANESTHESIA    . EYE SURGERY    . HAMSTRING LENGTHENING    . shunt replacement  2008   5th shunt for pt.   . TENDON LENGTHENING      There were no vitals filed for this visit.                    Pediatric PT Treatment - 08/02/16 0921      Subjective Information   Patient Comments When asked if she practiced rolling, Mira responded "not going to be any different."     PT Pediatric Exercise/Activities   Strengthening Activities Rolling with assist LE rolling supine to left.  V/c to bring right UE midline cross and use trunk to complete the rolling x 8 each direction.  Prone propped on forearms play with cues to keep forearms in line with shoulders.    Self-care Discussed importance of HEP regardless of performing rolling during daily  transitions.  Recommended to resume stander since wound has healed.      Therapeutic Activities   Therapeutic Activity Details Transitions from w/c to mat with SBA. Mat to w/c slide board transfer x 2.      Pain   Pain Assessment No/denies pain                 Patient Education - 08/02/16 0929    Education Provided Yes   Education Description Resume stander and practice rolling with repetitions at least 8 each side daily.    Person(s) Educated Patient;Mother   Method Education Verbal explanation;Discussed session   Comprehension Verbalized understanding          Peds PT Short Term Goals - 05/18/16 1324      PEDS PT  SHORT TERM GOAL #1   Title Elaya will be able to roll from the right and to the left with SBA to assist with positioning in the bed.    Baseline as of 05/16/16, goal met to the right, requires min-mod assist to roll to the left.    Time 6   Period Months   Status On-going     PEDS PT  SHORT TERM GOAL #2   Title Sharonica will be able to tolerate stander at home at least 5 days per  week 30-45 minutes    Baseline has not been in a stander at home due to family unable to transfer her.  New home aid is trained to transfer patient to stander and will initiate a new home program.    Time 6   Period Months     PEDS PT  SHORT TERM GOAL #3   Title Courteny will be able to perform the [name] with minimal use of UE assist level 2 for at least 5 minutes to demonstrate improved LE strength   Baseline unable to move without assist   Time 6   Period Months   Status New     PEDS PT  SHORT TERM GOAL #6   Title Icis will be able to independently lift her legs onto the mat when transitioning from sit to supine.   Baseline no change with slight assist with the left, min A with the right LE   Time 6   Period Months   Status On-going     PEDS PT  SHORT TERM GOAL #7   Title Mee will be able to reach to the right and back to midline without falling onto her forearm to  demonstrate improved core strength to help with trunk control during ADLS and sitting balance with reaching to the right and car rides in wheelchair.    Baseline requires stabilization with the use of her left hand to reach behind with the right UE,  x 1 with slight assist but did not rest her elbow on the mat.    Time 6   Period Months   Status Achieved     PEDS PT  SHORT TERM GOAL #8   Title Davonna and mom will be able to indicate community activities to initate a maintenance program after discharge.     Baseline [employer] at least 2 times per week   Time 6   Period Months   Status Achieved          Peds PT Long Term Goals - 05/18/16 1329      PEDS PT  LONG TERM GOAL #1   Title Tylia will be able to assist with all transfers throughout the day and stand to help improve her ADLs   Time 6   Period Months   Status On-going          Plan - 08/02/16 0931    Clinical Impression Statement [name] was not interested to perform rolling activity today. Non compliance with HEP.  We discussed importance of completed HEP to challenge self other than ADLs mobility.    PT plan Core strengthening.       Patient will benefit from skilled therapeutic intervention in order to improve the following deficits and impairments:  Decreased interaction with peers, Decreased ability to perform or assist with self-care, Decreased ability to maintain good postural alignment, Decreased function at home and in the community, Decreased abililty to observe the enviornment, Decreased sitting balance, Decreased standing balance  Visit Diagnosis: Cerebral palsy, unspecified type (HCC)  Muscle weakness  Decreased mobility and endurance   Problem List There are no active problems to display for this patient.   Zachery Dauer, PT 08/02/16 11:16 AM Phone: [phone] Fax: [facility] [address] [place], [place],  [address] Phone: [phone]   Fax:  [phone]  Name: Jody [name] MRN: [id number] Date of Birth: [birth year]

## 2016-08-08 ENCOUNTER — Ambulatory Visit: Payer: Medicare HMO | Admitting: Physical Therapy

## 2016-08-15 ENCOUNTER — Ambulatory Visit: Payer: Medicare HMO | Admitting: Physical Therapy

## 2016-08-29 ENCOUNTER — Ambulatory Visit: Payer: Medicare HMO | Admitting: Physical Therapy

## 2016-08-29 DIAGNOSIS — G809 Cerebral palsy, unspecified: Secondary | ICD-10-CM | POA: Diagnosis not present

## 2016-08-29 DIAGNOSIS — Z7409 Other reduced mobility: Secondary | ICD-10-CM

## 2016-08-29 DIAGNOSIS — M6281 Muscle weakness (generalized): Secondary | ICD-10-CM

## 2016-08-30 ENCOUNTER — Encounter: Payer: Self-pay | Admitting: Physical Therapy

## 2016-08-30 NOTE — Therapy (Signed)
[place], [place], [address] Phone: [phone]   Fax:  [phone]  Pediatric Physical Therapy Treatment  Patient Details  Name: Jody [name] MRN: [id number] Date of Birth: 03-29-1996 Referring Provider: Dr. Alba Cory  Encounter date: 08/29/2016      End of Session - 08/30/16 0857    Visit Number 28   Date for PT Re-Evaluation 10/14/16   Authorization Type [employer] and Medicaid   Authorization Time Period 05/30/16-10/14/16   Authorization - Visit Number 7   Authorization - Number of Visits 24   PT Start Time 2694   PT Stop Time 1600   PT Time Calculation (min) 45 min   Equipment Utilized During Treatment Orthotics   Activity Tolerance Patient tolerated treatment well   Behavior During Therapy Willing to participate      Past Medical History:  Diagnosis Date  . Hydradenitis    bilateral  . Hydrocephaly   . Seizures ([place])   . VP (ventriculoperitoneal) shunt status     Past Surgical History:  Procedure Laterality Date  . BOTOX INJECTION    . DENTAL EXAMINATION UNDER ANESTHESIA    . EYE SURGERY    . HAMSTRING LENGTHENING    . shunt replacement  2008   5th shunt for pt.   . TENDON LENGTHENING      There were no vitals filed for this visit.                    Pediatric PT Treatment - 08/30/16 0001      Subjective Information   Patient Comments Jody [name] reports face to face visit with new primary MD has been completed.      PT Pediatric Exercise/Activities   Strengthening Activities Sit ups x 10 with assist to anchor feet with green wedge.  Kicking cones in sitting with assist with right LE x 5 each extremity.      Therapeutic Activities   Therapeutic Activity Details Transitions from w/c to mat to sit with minimal assist to complete side lying to sit completion. Midline crossing weight shifting in sitting with cues to keep feet from extended without  LE base on floor. Transfer from mat to w/c with slide board x 2     Pain   Pain Assessment No/denies pain                 Patient Education - 08/30/16 0857    Education Provided Yes   Education Description Continue to work on [place] and rolling on at home.    Person(s) Educated Patient;Mother   Method Education Verbal explanation;Discussed session   Comprehension Verbalized understanding          Peds PT Short Term Goals - 05/18/16 1324      PEDS PT  SHORT TERM GOAL #1   Title Mandeep will be able to roll from the right and to the left with SBA to assist with positioning in the bed.    Baseline as of 05/16/16, goal met to the right, requires min-mod assist to roll to the left.    Time 6   Period Months   Status On-going     PEDS PT  SHORT TERM GOAL #2   Title Jody [name] will be able to tolerate stander at home at least 5 days per week 30-45 minutes    Baseline has not been in a stander at home due to family unable to transfer her.  New home  aid is trained to transfer patient to stander and will initiate a new home program.    Time 6   Period Months     PEDS PT  SHORT TERM GOAL #3   Title Jody [name] will be able to perform the [name] with minimal use of UE assist level 2 for at least 5 minutes to demonstrate improved LE strength   Baseline unable to move without assist   Time 6   Period Months   Status New     PEDS PT  SHORT TERM GOAL #6   Title Jody [name] will be able to independently lift her legs onto the mat when transitioning from sit to supine.   Baseline no change with slight assist with the left, min A with the right LE   Time 6   Period Months   Status On-going     PEDS PT  SHORT TERM GOAL #7   Title Jody [name] will be able to reach to the right and back to midline without falling onto her forearm to demonstrate improved core strength to help with trunk control during ADLS and sitting balance with reaching to the right and car rides in wheelchair.    Baseline  requires stabilization with the use of her left hand to reach behind with the right UE,  x 1 with slight assist but did not rest her elbow on the mat.    Time 6   Period Months   Status Achieved     PEDS PT  SHORT TERM GOAL #8   Title Jody [name] and mom will be able to indicate community activities to initate a maintenance program after discharge.     Baseline [employer] at least 2 times per week   Time 6   Period Months   Status Achieved          Peds PT Long Term Goals - 05/18/16 1329      PEDS PT  LONG TERM GOAL #1   Title Jody [name] will be able to assist with all transfers throughout the day and stand to help improve her ADLs   Time 6   Period Months   Status On-going          Plan - 08/30/16 0857    Clinical Impression Statement Jody [name] reports she is also using her stander at home.  Did well today.  Reports practicing rolling "few times" at home. Mom reports MD should have sent letter to [facility].    PT plan Core strengthening.       Patient will benefit from skilled therapeutic intervention in order to improve the following deficits and impairments:  Decreased interaction with peers, Decreased ability to perform or assist with self-care, Decreased ability to maintain good postural alignment, Decreased function at home and in the community, Decreased abililty to observe the enviornment, Decreased sitting balance, Decreased standing balance  Visit Diagnosis: Cerebral palsy, unspecified type (HCC)  Muscle weakness  Decreased mobility and endurance   Problem List There are no active problems to display for this patient.  Jody [name], PT 08/30/16 8:59 AM Phone: [phone] Fax: [place] [employer] [facility] [facility] [address] [place], [place], [address] Phone: [phone]   Fax:  [phone]  Name: Jody [name] MRN: [id number] Date of Birth: [birth year]

## 2016-09-05 ENCOUNTER — Ambulatory Visit: Payer: Medicare HMO | Attending: Pediatrics | Admitting: Physical Therapy

## 2016-09-05 DIAGNOSIS — G809 Cerebral palsy, unspecified: Secondary | ICD-10-CM | POA: Diagnosis present

## 2016-09-05 DIAGNOSIS — M6281 Muscle weakness (generalized): Secondary | ICD-10-CM | POA: Diagnosis present

## 2016-09-05 DIAGNOSIS — Z7409 Other reduced mobility: Secondary | ICD-10-CM | POA: Diagnosis present

## 2016-09-08 ENCOUNTER — Encounter: Payer: Self-pay | Admitting: Physical Therapy

## 2016-09-08 NOTE — Therapy (Addendum)
[facility] [address] [place], [place], [address] Phone: [phone]   Fax:  [phone]  Pediatric Physical Therapy Treatment  Patient Details  Name: Jody [name] MRN: [id number] Date of Birth: 01/24/1996 Referring Provider: Dr. Pamela Warner  Encounter date: 09/05/2016      End of Session - 09/08/16 1334    Visit Number 29   Date for PT Re-Evaluation 10/14/16   Authorization Type [employer] and Medicaid   Authorization Time Period 05/30/16-10/14/16   Authorization - Visit Number 8   Authorization - Number of Visits 24   PT Start Time 1520   PT Stop Time 1600   PT Time Calculation (min) 40 min   Equipment Utilized During Treatment Orthotics   Activity Tolerance Patient tolerated treatment well   Behavior During Therapy Willing to participate      Past Medical History:  Diagnosis Date  . Hydradenitis    bilateral  . Hydrocephaly   . Seizures (HCC)   . VP (ventriculoperitoneal) shunt status     Past Surgical History:  Procedure Laterality Date  . BOTOX INJECTION    . DENTAL EXAMINATION UNDER ANESTHESIA    . EYE SURGERY    . HAMSTRING LENGTHENING    . shunt replacement  2008   5th shunt for pt.   . TENDON LENGTHENING      There were no vitals filed for this visit.                    Pediatric PT Treatment - 09/08/16 1327      Subjective Information   Patient Comments Mom reported a rough weekend with muscle spasms and pain.      PT Pediatric Exercise/Activities   Strengthening Activities Prone on mat with cues to maintain forearm prop with head erected.  Lateral midline cross reaching with feet on bench LE 90-90 degrees.  Anterior reaching on bench and return to midline sitting positions.       Therapeutic Activities   Therapeutic Activity Details Transition from w/c -mat supervision.  Sidelying to sitting MIn A. Mat to chair stand pivot transfer completed by mom.       Pain   Pain Assessment No/denies pain                 Patient Education - 09/08/16 1333    Education Provided Yes   Education Description Continue to work on stander and rolling on at home.    Person(s) Educated Patient;Mother   Method Education Verbal explanation;Discussed session   Comprehension Verbalized understanding          Peds PT Short Term Goals - 05/18/16 1324      PEDS PT  SHORT TERM GOAL #1   Title Jody [name] will be able to roll from the right and to the left with SBA to assist with positioning in the bed.    Baseline as of 05/16/16, goal met to the right, requires min-mod assist to roll to the left.    Time 6   Period Months   Status On-going     PEDS PT  SHORT TERM GOAL #2   Title Jody [name] will be able to tolerate stander at home at least 5 days per week 30-45 minutes    Baseline has not been in a stander at home due to family unable to transfer her.  New home aid is trained to transfer patient to stander and will initiate a new home program.      Time 6   Period Months     PEDS PT  SHORT TERM GOAL #3   Title Jody [name] will be able to perform the [name] with minimal use of UE assist level 2 for at least 5 minutes to demonstrate improved LE strength   Baseline unable to move without assist   Time 6   Period Months   Status New     PEDS PT  SHORT TERM GOAL #6   Title Jody [name] will be able to independently lift her legs onto the mat when transitioning from sit to supine.   Baseline no change with slight assist with the left, min A with the right LE   Time 6   Period Months   Status On-going     PEDS PT  SHORT TERM GOAL #7   Title Jody [name] will be able to reach to the right and back to midline without falling onto her forearm to demonstrate improved core strength to help with trunk control during ADLS and sitting balance with reaching to the right and car rides in wheelchair.    Baseline requires stabilization with the use of her left hand to reach behind with the  right UE,  x 1 with slight assist but did not rest her elbow on the mat.    Time 6   Period Months   Status Achieved     PEDS PT  SHORT TERM GOAL #8   Title Jody [name] and mom will be able to indicate community activities to initate a maintenance program after discharge.     Baseline [employer] at least 2 times per week   Time 6   Period Months   Status Achieved          Peds PT Long Term Goals - 05/18/16 1329      PEDS PT  LONG TERM GOAL #1   Title Jody [name] will be able to assist with all transfers throughout the day and stand to help improve her ADLs   Time 6   Period Months   Status On-going          Plan - 09/08/16 1334    Clinical Impression Statement Spasms were reported at end of session by mom.  Jody [name] did not have any complaints of pain during the session.  Cues to keep head erect in prone was moderate.    PT plan Core strengthening.      Late entry G-Codes Clinical judgement Change and maintaining body position Current CM Goals CL  Patient will benefit from skilled therapeutic intervention in order to improve the following deficits and impairments:  Decreased interaction with peers, Decreased ability to perform or assist with self-care, Decreased ability to maintain good postural alignment, Decreased function at home and in the community, Decreased abililty to observe the enviornment, Decreased sitting balance, Decreased standing balance  Visit Diagnosis: Cerebral palsy, unspecified type (HCC)  Muscle weakness  Decreased mobility and endurance   Problem List There are no active problems to display for this patient.   Zachery Dauer, PT 09/08/16 1:36 PM Phone: [phone] Fax: [place] [address] [place], [place], [address] Phone: [phone]   Fax:  [phone]  Name: Jody [name] MRN: [id number] Date of Birth: 01-29-1996

## 2016-09-12 ENCOUNTER — Ambulatory Visit: Payer: Medicare HMO | Admitting: Physical Therapy

## 2016-09-12 DIAGNOSIS — G809 Cerebral palsy, unspecified: Secondary | ICD-10-CM | POA: Diagnosis not present

## 2016-09-12 DIAGNOSIS — M6281 Muscle weakness (generalized): Secondary | ICD-10-CM

## 2016-09-14 ENCOUNTER — Encounter: Payer: Self-pay | Admitting: Physical Therapy

## 2016-09-15 NOTE — Therapy (Signed)
[place], [place], [address] Phone: [phone]   Fax:  [phone]  Pediatric Physical Therapy Treatment  Patient Details  Name: Jody [name] MRN: [id number] Date of Birth: 1995-06-28 Referring Provider: Dr. Alba Cory  Encounter date: 09/12/2016      End of Session - 09/14/16 2359    Visit Number 30   Date for PT Re-Evaluation 10/14/16   Authorization Type [employer] and Medicaid   Authorization Time Period 05/30/16-10/14/16   Authorization - Visit Number 9   Authorization - Number of Visits 24   PT Start Time 6226   PT Stop Time 1600  late arrival   PT Time Calculation (min) 37 min   Equipment Utilized During Treatment Orthotics   Activity Tolerance Patient tolerated treatment well   Behavior During Therapy Willing to participate      Past Medical History:  Diagnosis Date  . Hydradenitis    bilateral  . Hydrocephaly   . Seizures ([place])   . VP (ventriculoperitoneal) shunt status     Past Surgical History:  Procedure Laterality Date  . BOTOX INJECTION    . DENTAL EXAMINATION UNDER ANESTHESIA    . EYE SURGERY    . HAMSTRING LENGTHENING    . shunt replacement  2008   5th shunt for pt.   . TENDON LENGTHENING      There were no vitals filed for this visit.                    Pediatric PT Treatment - 09/15/16 0001      Pain Assessment   Pain Assessment No/denies pain     Subjective Information   Patient Comments Mom interested to move forward with Botox.    Interpreter Present No     PT Pediatric Exercise/Activities   Session Observed by mother   Strengthening Activities Remained in w/c. Lateral reaching for bean animals with over head throws.  Min-moderate assist right to midline return. Horizontal noodle ball hitting with bilateral UE to activate the core. Theratube yellow shoulder abduction at chest level. Overhead ball throws after flexing  anterior to reach ball. Ball kicking in chair.                    Peds PT Short Term Goals - 05/18/16 1324      PEDS PT  SHORT TERM GOAL #1   Title Nyelah will be able to roll from the right and to the left with SBA to assist with positioning in the bed.    Baseline as of 05/16/16, goal met to the right, requires min-mod assist to roll to the left.    Time 6   Period Months   Status On-going     PEDS PT  SHORT TERM GOAL #2   Title Andres will be able to tolerate stander at home at least 5 days per week 30-45 minutes    Baseline has not been in a stander at home due to family unable to transfer her.  New home aid is trained to transfer patient to stander and will initiate a new home program.    Time 6   Period Months     PEDS PT  SHORT TERM GOAL #3   Title Shellye will be able to perform the [name] with minimal use of UE assist level 2 for at least 5 minutes to demonstrate improved LE strength   Baseline unable to move without assist  Time 6   Period Months   Status New     PEDS PT  SHORT TERM GOAL #6   Title Miyuki will be able to independently lift her legs onto the mat when transitioning from sit to supine.   Baseline no change with slight assist with the left, min A with the right LE   Time 6   Period Months   Status On-going     PEDS PT  SHORT TERM GOAL #7   Title Lilyann will be able to reach to the right and back to midline without falling onto her forearm to demonstrate improved core strength to help with trunk control during ADLS and sitting balance with reaching to the right and car rides in wheelchair.    Baseline requires stabilization with the use of her left hand to reach behind with the right UE,  x 1 with slight assist but did not rest her elbow on the mat.    Time 6   Period Months   Status Achieved     PEDS PT  SHORT TERM GOAL #8   Title Ivry and mom will be able to indicate community activities to initate a maintenance program after discharge.      Baseline [employer] at least 2 times per week   Time 6   Period Months   Status Achieved          Peds PT Long Term Goals - 05/18/16 1329      PEDS PT  LONG TERM GOAL #1   Title Renda will be able to assist with all transfers throughout the day and stand to help improve her ADLs   Time 6   Period Months   Status On-going          Plan - 09/15/16 0000    Clinical Impression Statement Mom reported w/c is awaiting Medicaid approval since [employer] denied authorization. We discussed upcoming recert and possible discharge. We discussed in length episodic care. Mom concerned and wanted to continue PT after Botox and possiblity of getting new chair.    PT plan Core strengthening.       Patient will benefit from skilled therapeutic intervention in order to improve the following deficits and impairments:  Decreased interaction with peers, Decreased ability to perform or assist with self-care, Decreased ability to maintain good postural alignment, Decreased function at home and in the community, Decreased abililty to observe the enviornment, Decreased sitting balance, Decreased standing balance  Visit Diagnosis: Cerebral palsy, unspecified type (HCC)  Muscle weakness   Problem List There are no active problems to display for this patient.   Zachery Dauer, PT 09/15/16 12:07 AM Phone: [phone] Fax: [place] [address] [place], [place], [address] Phone: [phone]   Fax:  [phone]  Name: Jody [name] MRN: [id number] Date of Birth: [birth year]

## 2016-09-19 ENCOUNTER — Ambulatory Visit: Payer: Medicare HMO | Admitting: Physical Therapy

## 2016-09-19 DIAGNOSIS — G809 Cerebral palsy, unspecified: Secondary | ICD-10-CM

## 2016-09-19 DIAGNOSIS — M6281 Muscle weakness (generalized): Secondary | ICD-10-CM

## 2016-09-20 NOTE — Therapy (Signed)
[place], [place], [address] Phone: [phone]   Fax:  [phone]  Pediatric Physical Therapy Treatment  Patient Details  Name: Jody [name] MRN: [id number] Date of Birth: [birth year] Referring Provider: Dr. Alba Cory  Encounter date: 09/19/2016      End of Session - 09/20/16 0856    Visit Number 31   Date for PT Re-Evaluation 10/14/16   Authorization Type [employer] and Medicaid   Authorization Time Period 05/30/16-10/14/16   Authorization - Visit Number 10   Authorization - Number of Visits 24   PT Start Time 1522   PT Stop Time 1600  Late arrival and seizure during session. Only 1 unit charged.    PT Time Calculation (min) 38 min   Equipment Utilized During Treatment Orthotics   Activity Tolerance --  Tolerated up to the time of seizure   Behavior During Therapy Willing to participate      Past Medical History:  Diagnosis Date  . Hydradenitis    bilateral  . Hydrocephaly   . Seizures ([place])   . VP (ventriculoperitoneal) shunt status     Past Surgical History:  Procedure Laterality Date  . BOTOX INJECTION    . DENTAL EXAMINATION UNDER ANESTHESIA    . EYE SURGERY    . HAMSTRING LENGTHENING    . shunt replacement  2008   5th shunt for pt.   . TENDON LENGTHENING      There were no vitals filed for this visit.                    Pediatric PT Treatment - 09/20/16 0853      Pain Assessment   Pain Assessment No/denies pain     Subjective Information   Patient Comments Seizure during session. Reported no headache but numbness in her LEs.    Interpreter Present No     PT Pediatric Exercise/Activities   Session Observed by Mom came back when asked during the seizure.    Strengthening Activities Sitting on swiss disc with lateral reaching. Min A occasional with LOB.      Therapeutic Activities   Therapeutic Activity Details Transition from w/c to  mat with SBA, min A to transition sidelying to sit. Stand pivot transfer with mom back into chair.                  Patient Education - 09/20/16 0856    Education Provided No          Peds PT Short Term Goals - 05/18/16 1324      PEDS PT  SHORT TERM GOAL #1   Title Zoua will be able to roll from the right and to the left with SBA to assist with positioning in the bed.    Baseline as of 05/16/16, goal met to the right, requires min-mod assist to roll to the left.    Time 6   Period Months   Status On-going     PEDS PT  SHORT TERM GOAL #2   Title Jamarie will be able to tolerate stander at home at least 5 days per week 30-45 minutes    Baseline has not been in a stander at home due to family unable to transfer her.  New home aid is trained to transfer patient to stander and will initiate a new home program.    Time 6   Period Months     PEDS PT  SHORT TERM  GOAL #3   Title Sayde will be able to perform the [name] with minimal use of UE assist level 2 for at least 5 minutes to demonstrate improved LE strength   Baseline unable to move without assist   Time 6   Period Months   Status New     PEDS PT  SHORT TERM GOAL #6   Title Jaisa will be able to independently lift her legs onto the mat when transitioning from sit to supine.   Baseline no change with slight assist with the left, min A with the right LE   Time 6   Period Months   Status On-going     PEDS PT  SHORT TERM GOAL #7   Title Vinnie will be able to reach to the right and back to midline without falling onto her forearm to demonstrate improved core strength to help with trunk control during ADLS and sitting balance with reaching to the right and car rides in wheelchair.    Baseline requires stabilization with the use of her left hand to reach behind with the right UE,  x 1 with slight assist but did not rest her elbow on the mat.    Time 6   Period Months   Status Achieved     PEDS PT  SHORT TERM GOAL #8    Title Iesha and mom will be able to indicate community activities to initate a maintenance program after discharge.     Baseline [employer] at least 2 times per week   Time 6   Period Months   Status Achieved          Peds PT Long Term Goals - 05/18/16 1329      PEDS PT  LONG TERM GOAL #1   Title Zadia will be able to assist with all transfers throughout the day and stand to help improve her ADLs   Time 6   Period Months   Status On-going          Plan - 09/20/16 0857    Clinical Impression Statement Krisanne had a silent seizure during the session today.  About 12 minutes of treatment and she had startle reflex response x 2 and mom this may have triggered the seizure.  Approximate time of seizure was about 5 minutes in duration. Session stopped after seizure.    PT plan Continue to work on goals       Patient will benefit from skilled therapeutic intervention in order to improve the following deficits and impairments:  Decreased interaction with peers, Decreased ability to perform or assist with self-care, Decreased ability to maintain good postural alignment, Decreased function at home and in the community, Decreased abililty to observe the enviornment, Decreased sitting balance, Decreased standing balance  Visit Diagnosis: Cerebral palsy, unspecified type ([place])  Muscle weakness   Problem List There are no active problems to display for this patient.  Zachery Dauer, PT 09/20/16 9:00 AM Phone: [phone] Fax: [facility], [place], [address] Phone: [phone]   Fax:  [phone]  Name: Jody [name] MRN: [id number] Date of Birth: [birth year]

## 2016-10-03 ENCOUNTER — Ambulatory Visit: Payer: Medicare HMO | Admitting: Physical Therapy

## 2016-10-10 ENCOUNTER — Ambulatory Visit: Payer: Medicare HMO | Admitting: Physical Therapy

## 2016-10-17 ENCOUNTER — Ambulatory Visit: Payer: Medicare HMO | Attending: Pediatrics | Admitting: Physical Therapy

## 2016-10-17 DIAGNOSIS — G809 Cerebral palsy, unspecified: Secondary | ICD-10-CM

## 2016-10-17 DIAGNOSIS — Z7409 Other reduced mobility: Secondary | ICD-10-CM | POA: Diagnosis present

## 2016-10-19 ENCOUNTER — Encounter: Payer: Self-pay | Admitting: Physical Therapy

## 2016-10-19 DIAGNOSIS — G809 Cerebral palsy, unspecified: Secondary | ICD-10-CM | POA: Diagnosis not present
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
## 2016-10-24 ENCOUNTER — Ambulatory Visit: Payer: Medicare HMO | Admitting: Physical Therapy

## 2016-10-31 ENCOUNTER — Ambulatory Visit: Payer: Medicare HMO | Admitting: Physical Therapy

## 2016-11-03 ENCOUNTER — Encounter (HOSPITAL_COMMUNITY): Payer: Self-pay | Admitting: *Deleted

## 2016-11-03 ENCOUNTER — Ambulatory Visit (HOSPITAL_BASED_OUTPATIENT_CLINIC_OR_DEPARTMENT_OTHER)
Admit: 2016-11-03 | Discharge: 2016-11-03 | Disposition: A | Discharge status: Home / Self Care | Payer: Medicare HMO | Attending: Student | Admitting: Student

## 2016-11-03 ENCOUNTER — Emergency Department (HOSPITAL_COMMUNITY)
Admission: EM | Admit: 2016-11-03 | Discharge: 2016-11-03 | Disposition: A | Discharge status: Home / Self Care | Payer: Medicare HMO | Attending: Emergency Medicine | Admitting: Emergency Medicine

## 2016-11-03 ENCOUNTER — Emergency Department (HOSPITAL_COMMUNITY): Payer: Medicare HMO

## 2016-11-03 DIAGNOSIS — Z79899 Other long term (current) drug therapy: Secondary | ICD-10-CM | POA: Insufficient documentation

## 2016-11-03 DIAGNOSIS — T85618A Breakdown (mechanical) of other specified internal prosthetic devices, implants and grafts, initial encounter: Secondary | ICD-10-CM

## 2016-11-03 DIAGNOSIS — R569 Unspecified convulsions: Secondary | ICD-10-CM | POA: Diagnosis not present

## 2016-11-03 DIAGNOSIS — Y829 Unspecified medical devices associated with adverse incidents: Secondary | ICD-10-CM | POA: Diagnosis not present

## 2016-11-03 DIAGNOSIS — T8509XA Other mechanical complication of ventricular intracranial (communicating) shunt, initial encounter: Secondary | ICD-10-CM | POA: Insufficient documentation

## 2016-11-03 DIAGNOSIS — M79604 Pain in right leg: Secondary | ICD-10-CM

## 2016-11-03 LAB — BASIC METABOLIC PANEL
Anion gap: 9 (ref 5–15)
BUN: 9 mg/dL (ref 6–20)
CALCIUM: 9.3 mg/dL (ref 8.9–10.3)
CO2: 18 mmol/L — AB (ref 22–32)
CREATININE: 0.88 mg/dL (ref 0.44–1.00)
Chloride: 110 mmol/L (ref 101–111)
GFR calc Af Amer: >60 mL/min (ref >60)
GFR calc non Af Amer: >60 mL/min (ref >60)
GLUCOSE: 84 mg/dL (ref 65–99)
Potassium: 5.2 mmol/L — ABNORMAL HIGH (ref 3.5–5.1)
Sodium: 137 mmol/L (ref 135–145)

## 2016-11-03 LAB — URINALYSIS, ROUTINE W REFLEX MICROSCOPIC
BILIRUBIN URINE: NEGATIVE
GLUCOSE, UA: NEGATIVE mg/dL
Hgb urine dipstick: NEGATIVE
KETONES UR: NEGATIVE mg/dL
LEUKOCYTES UA: NEGATIVE
Nitrite: NEGATIVE
PH: 8 (ref 5.0–8.0)
Protein, ur: NEGATIVE mg/dL
SPECIFIC GRAVITY, URINE: 1.009 (ref 1.005–1.030)

## 2016-11-03 LAB — POC URINE PREG, ED: Preg Test, Ur: NEGATIVE

## 2016-11-03 MED ORDER — KETOROLAC TROMETHAMINE 30 MG/ML IJ SOLN
30.0000 mg | Freq: Once | INTRAMUSCULAR | Status: AC
  Administered 2016-11-03: 30 mg via INTRAMUSCULAR
  Filled 2016-11-03: qty 1

## 2016-11-03 MED ORDER — ACETAMINOPHEN 325 MG PO TABS
650.0000 mg | ORAL_TABLET | Freq: Once | ORAL | Status: AC
  Administered 2016-11-03: 650 mg via ORAL
  Filled 2016-11-03: qty 2

## 2016-11-03 NOTE — ED Notes (Signed)
Bed: WA15 Expected date:  Expected time:  Means of arrival:  Comments: EMS- seizure 

## 2016-11-03 NOTE — ED Notes (Signed)
Unable to draw blood from pt who states that she usually has to have vascular team.  Placed IV team consult.

## 2016-11-03 NOTE — ED Provider Notes (Signed)
[facility]-EMERGENCY DEPT Provider Note   CSN: [id number] Arrival date & time: 11/03/16  1429     History   Chief Complaint Chief Complaint  Patient presents with  . Seizures    HPI Jody [name] is a 21 y.o. female.  HPI 21 year old African-American female past medical history significant for cerebral palsy, hydrocephaly, VP shunt placement, seizures that presents to the emergency department today by EMS with mother at bedside with complaints of absence seizure. Mom states that patient was at work when EMS was called for patient having a staring SPELL which is typical of her normal seizures. Mother reports that when she got there patient was back to baseline and then had a second seizure. States that "she went silent and her eyes stared off which is typical of her seizures". Mom also states the patient has been leaning more to the right however she does have a slightly baseline. Mom states the patient's seizure was preceded by pain in her right calf. She notes that the right leg has been mildly swollen and there has been a knot in the right calf. Was seen by primary care doctor referred her to orthopedics. States that other than today patient has only had one other episode of seizure approximately 6 months ago. She is currently on medication takes it regularly per mom. Patient is wheelchair-bound. Mom states the patient is at her baseline at this time. States that she sees a neurologist [facility] at [facility]. Patient currently denies any pain at this time.  Pt denies any fever, chill, ha, vision changes, lightheadedness, dizziness, congestion, neck pain, cp, sob, cough, abd pain, n/v/d, urinary symptoms, change in bowel habits, melena, hematochezia, lower extremity paresthesias.  Past Medical History:  Diagnosis Date  . Hydradenitis    bilateral  . Hydrocephaly   . Seizures (HCC)   . VP (ventriculoperitoneal) shunt status     There are no active problems to display for this patient.   Past  Surgical History:  Procedure Laterality Date  . BOTOX INJECTION    . DENTAL EXAMINATION UNDER ANESTHESIA    . EYE SURGERY    . HAMSTRING LENGTHENING    . shunt replacement  2008   5th shunt for pt.   . TENDON LENGTHENING      OB History    No data available       Home Medications    Prior to Admission medications   Medication Sig Start Date End Date Taking? Authorizing Provider  baclofen (LIORESAL) 10 MG tablet Take 5-15 mg by mouth 2 (two) times daily. 5 mg in the morning and 15 mg at night   Yes [provider]  cyclobenzaprine (FLEXERIL) 5 MG tablet Take 5 mg by mouth every 8 (eight) hours. For 7 days. 06/05/15  Yes [provider]  finasteride (PROSCAR) 5 MG tablet Take 5 mg by mouth daily. 06/18/15  Yes [provider]  fluconazole (DIFLUCAN) 100 MG tablet Take 200 mg by mouth as directed. TAKE 2 TABLETS (200 MG) AS NEEDED FOR YEAST INFECTIONS. 08/20/14  Yes [provider]  ibuprofen (ADVIL,MOTRIN) 800 MG tablet Take 800 mg by mouth every 8 (eight) hours as needed for headache, mild pain or moderate pain.    Yes [provider]  ondansetron (ZOFRAN-ODT) 4 MG disintegrating tablet Take 1 tablet (4 mg total) by mouth every 8 (eight) hours as needed for nausea or vomiting. 12/03/14  Yes [name], MD  propranolol (INDERAL) 20 MG tablet Take 20 mg by mouth 2 (  two) times daily.   Yes [provider]  rizatriptan (MAXALT) 10 MG tablet Take 10 mg by mouth as needed for migraine. May repeat in 2 hours if needed   Yes [provider]  sulfamethoxazole-trimethoprim (BACTRIM DS,SEPTRA DS) 800-160 MG tablet Take 1 tablet by mouth 2 (two) times daily.   Yes [provider]  zonisamide (ZONEGRAN) 100 MG capsule Take 100 mg by mouth 2 (two) times daily.    Yes [provider]  diazepam (DIASTAT ACUDIAL) 20 MG GEL Place 15 mg rectally once as needed (seizures).    [provider]    Family  History History reviewed. No pertinent family history.  Social History Social History  Substance Use Topics  . Smoking status: Never Smoker  . Smokeless tobacco: Never Used  . Alcohol use No     Allergies   Ceftibuten; Fentanyl; and Tape   Review of Systems Review of Systems  Constitutional: Negative for chills and fever.  HENT: Negative for congestion.   Eyes: Negative for visual disturbance.  Respiratory: Negative for cough and shortness of breath.   Cardiovascular: Negative for chest pain.  Gastrointestinal: Negative for abdominal pain, diarrhea, nausea and vomiting.  Genitourinary: Negative for dysuria, flank pain, frequency, hematuria, urgency, vaginal bleeding and vaginal discharge.  Musculoskeletal: Positive for myalgias. Negative for arthralgias.  Skin: Negative for rash.  Neurological: Positive for seizures. Negative for dizziness, syncope, weakness, light-headedness, numbness and headaches.  Psychiatric/Behavioral: Negative for sleep disturbance. The patient is not nervous/anxious.      Physical Exam Updated Vital Signs BP 114/78 (BP Location: Left Arm)   Pulse 73   Temp 98.8 F (37.1 C) (Oral)   Resp 16   LMP 10/04/2016 (Approximate)   SpO2 99%   Physical Exam  Constitutional: She is oriented to person, place, and time. She appears well-developed and well-nourished.  Non-toxic appearance. No distress.  HENT:  Head: Normocephalic and atraumatic.  Nose: Nose normal.  Mouth/Throat: Oropharynx is clear and moist.  Eyes: Conjunctivae and EOM are normal. Pupils are equal, round, and reactive to light. Right eye exhibits no discharge. Left eye exhibits no discharge.  Neck: Normal range of motion. Neck supple.  Cardiovascular: Normal rate, regular rhythm, normal heart sounds and intact distal pulses.  Exam reveals no gallop and no friction rub.   No murmur heard. Pulmonary/Chest: Effort normal and breath sounds normal. No respiratory distress. She exhibits no  tenderness.  Abdominal: Soft. Bowel sounds are normal. There is no tenderness. There is no rebound and no guarding.  Musculoskeletal: Normal range of motion. She exhibits no tenderness.  Patient with pain in right calf minimal edema noted. No erythema or ecchymosis. DP pulses are 2+ bilaterally.  Lymphadenopathy:    She has no cervical adenopathy.  Neurological: She is alert and oriented to person, place, and time.  The patient is alert, attentive, and oriented x 3. Speech is clear. Cranial nerve II-VII grossly intact. Negative pronator drift. Sensation intact. Strength 5/5 in all extremities. Reflexes 2+ and symmetric at biceps, triceps, knees, and ankles. Rapid alternating movement and fine finger movements intact.    Skin: Skin is warm and dry. Capillary refill takes less than 2 seconds.  Psychiatric: Her behavior is normal. Judgment and thought content normal.  Nursing note and vitals reviewed.    ED Treatments / Results  Labs (all labs ordered are listed, but only abnormal results are displayed) Labs Reviewed  BASIC METABOLIC PANEL - Abnormal; Notable for the following:  Result Value   Potassium 5.2 (*)    CO2 18 (*)    All other components within normal limits  URINALYSIS, ROUTINE W REFLEX MICROSCOPIC - Abnormal; Notable for the following:    Color, Urine STRAW (*)    All other components within normal limits  CBC WITH DIFFERENTIAL/PLATELET  CBC WITH DIFFERENTIAL/PLATELET  POC URINE PREG, ED    EKG  EKG Interpretation None       Radiology Dg Skull 1-3 Views  Result Date: 11/03/2016 CLINICAL DATA:  Ventriculoperitoneal shunt. Shunt malfunction. Postictal. EXAM: SKULL - 1-3 VIEW COMPARISON:  Skull radiograph 07/26/2014 FINDINGS: Right parietal ventricular shunt catheter tip unchanged in radiographic positioning. Catheter tubing appears intact coursing in the right neck. No evidence of discontinuity or kink. IMPRESSION: Intact right ventriculostomy shunt catheter in  the head neck. Electronically Signed   By: Rubye Oaks M.D.   On: 11/03/2016 19:28   Dg Chest 1 View  Result Date: 11/03/2016 CLINICAL DATA:  Ventriculoperitoneal shunt. Shunt malfunction. Postictal. EXAM: CHEST 1 VIEW COMPARISON:  Radiographs 10/01/2014 FINDINGS: Ventriculoperitoneal shunt catheter tubing courses over the right chest, tip not included in the field of view in the abdomen. No discontinuity or kink. Lung volumes are low. Heart size accentuated by low lung volumes. No consolidation, pleural effusion or pneumothorax. IMPRESSION: Intact ventricular shunt catheter tubing in the right chest. Low lung volumes. Electronically Signed   By: Rubye Oaks M.D.   On: 11/03/2016 19:30   Dg Abdomen 1 View  Result Date: 11/03/2016 CLINICAL DATA:  Ventriculoperitoneal shunt. Shunt malfunction. Postictal. EXAM: ABDOMEN - 1 VIEW COMPARISON:  12/03/2014 FINDINGS: Intake ventriculoperitoneal shunt catheter tubing in the right abdomen, tip in the pelvis. No discontinuity or kink. Upper abdominal portion included on concurrent chest radiograph. Normal bowel gas pattern. No dilated bowel loops. Small volume of colonic stool. Mild scoliosis of the lumbar spine. IMPRESSION: Intact ventriculoperitoneal shunt catheter tubing in the right abdomen. No discontinuity or kink. Electronically Signed   By: Rubye Oaks M.D.   On: 11/03/2016 19:31    Procedures Procedures (including critical care time)  Medications Ordered in ED Medications  ketorolac (TORADOL) 30 MG/ML injection 30 mg (not administered)  acetaminophen (TYLENOL) tablet 650 mg (650 mg Oral Given 11/03/16 1611)     Initial Impression / Assessment and Plan / ED Course  I have reviewed the triage vital signs and the nursing notes.  Pertinent labs & imaging results that were available during my care of the patient were reviewed by me and considered in my medical decision making (see chart for details).     Patient presents to emergency  department today with mother for complaints of 2 episodes of Seizures While at Work Today. Patient with History of Seizures, Cerebral Palsy, VP Shunt. Mom States Is Typical of Her Seizures. She Has Taken Medications As Prescribed. Patient Is Wheelchair-Bound. Seizures Were Preceded by Pain in her right leg. Has been seen by primary care for same. Ultrasound was obtained that showed no evidence of lower extremity DVT. Patient has had no further episodes of seizures while in the ED. Mom states the patient is at her baseline. Labs are reassuring. No signs of infection. Potassium is mildly elevated at 5.3 however is slightly hemolyzed. Vital signs have been reassuring. Patient has no focal neuro deficits and has neurovascularly intact in all extremities. She is resting comfortably in the bed. X-rays of VP shunt so no obstruction.   CBC was clotted. Difficult stick. Do not feel like this is  necessary for d/c. Will not change work up.   To speak with patient's neurologist on call for their office who recommends basic lab work and x-rays of VP shunt. He recommends no medication change at this time and follow up with them in their office next week.  Pt is hemodynamically stable, in NAD, & able to ambulate in the ED. Evaluation does not show pathology that would require ongoing emergent intervention or inpatient treatment. I explained the diagnosis to the patient. Pain has been managed & has no complaints prior to dc. Pt mother is comfortable with above plan and is stable for discharge at this time. All questions were answered prior to disposition. Strict return precautions for f/u to the ED were discussed. Encouraged follow up with PCP.     Final Clinical Impressions(s) / ED Diagnoses   Final diagnoses:  Shunt malfunction  Seizure (HCC)  Right leg pain    New Prescriptions New Prescriptions   No medications on file     Wallace KellerLeaphart, Kathy Wares T, PA-C 11/03/16 2026    Little, Ambrose Finlandachel Morgan, MD 11/04/16  [phone]

## 2016-11-03 NOTE — Progress Notes (Addendum)
**  Preliminary report by tech**  Right lower extremity venous duplex complete. There is no obvious evidence of deep or superficial vein thrombosis involving the right lower extremity. All clearly visualized vessels appear patent and compressible. There is no evidence of a Baker's cyst on the right. Results were given to Demetrios LollKenneth Leaphart PA.  11/03/16 5:02 PM Olen CordialGreg Kiyomi Pallo RVT

## 2016-11-03 NOTE — ED Notes (Signed)
Ultrasound at bedside

## 2016-11-03 NOTE — Discharge Instructions (Signed)
Her ultrasound shows no signs of blood clot. X-ray showed that her shunt is in place and draining. Lab work is reassuring. No signs of infection in her urine. Continue her medications at home. Make she follow up with her neurologist this week. Return to the ED if she feels any worsening symptoms.

## 2016-11-03 NOTE — ED Triage Notes (Signed)
EMS called to work.  Patient found post-ictal.  Patient was non verbal after an absent seizure which is with in her normal after a seizure.  Patient is alert and oriented x4 on arrival to the ED.  Currently denies any pain

## 2016-11-03 NOTE — ED Notes (Signed)
Patient's family member had been made aware a urine sample is needed. Discussed using an external catheter. Patient's family member would liek to use a female urinal before. Patinet's family member is aware the urine sample is needed as soon as possible and assistance has been offered at any time.

## 2016-11-07 ENCOUNTER — Ambulatory Visit: Payer: Medicare HMO | Admitting: Physical Therapy

## 2016-11-14 ENCOUNTER — Ambulatory Visit: Payer: Medicare HMO | Admitting: Physical Therapy

## 2016-11-21 ENCOUNTER — Ambulatory Visit: Payer: Medicare HMO | Admitting: Physical Therapy

## 2016-11-28 ENCOUNTER — Ambulatory Visit: Payer: Medicare HMO | Admitting: Physical Therapy

## 2016-12-05 ENCOUNTER — Ambulatory Visit: Payer: Medicare HMO | Admitting: Physical Therapy

## 2016-12-12 ENCOUNTER — Ambulatory Visit: Payer: Medicare HMO | Admitting: Physical Therapy

## 2016-12-19 ENCOUNTER — Ambulatory Visit: Payer: Medicare HMO | Admitting: Physical Therapy

## 2016-12-26 ENCOUNTER — Ambulatory Visit: Payer: Medicare HMO | Admitting: Physical Therapy

## 2017-01-09 ENCOUNTER — Ambulatory Visit: Payer: Medicare HMO | Admitting: Physical Therapy

## 2017-01-16 ENCOUNTER — Ambulatory Visit: Payer: Medicare HMO | Admitting: Physical Therapy

## 2017-01-23 ENCOUNTER — Ambulatory Visit: Payer: Medicare HMO | Admitting: Physical Therapy

## 2017-01-30 ENCOUNTER — Ambulatory Visit: Payer: Medicare HMO | Admitting: Physical Therapy

## 2017-02-06 ENCOUNTER — Ambulatory Visit: Payer: Medicare HMO | Admitting: Physical Therapy

## 2017-02-13 ENCOUNTER — Ambulatory Visit: Payer: Medicare HMO | Admitting: Physical Therapy

## 2017-02-20 ENCOUNTER — Ambulatory Visit: Payer: Medicare HMO | Admitting: Physical Therapy

## 2017-02-27 ENCOUNTER — Ambulatory Visit: Payer: Medicare HMO | Admitting: Physical Therapy

## 2017-03-06 ENCOUNTER — Ambulatory Visit: Payer: Medicare HMO | Admitting: Physical Therapy

## 2017-03-13 ENCOUNTER — Ambulatory Visit: Payer: Medicare HMO | Admitting: Physical Therapy

## 2017-03-20 ENCOUNTER — Ambulatory Visit: Payer: Medicare HMO | Admitting: Physical Therapy

## 2017-03-27 ENCOUNTER — Ambulatory Visit: Payer: Medicare HMO | Admitting: Physical Therapy

## 2017-03-28 ENCOUNTER — Ambulatory Visit (INDEPENDENT_AMBULATORY_CARE_PROVIDER_SITE_OTHER): Payer: Medicare HMO | Admitting: Family Medicine

## 2017-03-28 ENCOUNTER — Encounter: Payer: Self-pay | Admitting: Family Medicine

## 2017-03-28 ENCOUNTER — Encounter: Payer: Self-pay | Admitting: *Deleted

## 2017-03-28 VITALS — BP 116/74 | HR 50 | Temp 98.9°F

## 2017-03-28 DIAGNOSIS — Z0120 Encounter for dental examination and cleaning without abnormal findings: Secondary | ICD-10-CM

## 2017-03-28 DIAGNOSIS — Z0000 Encounter for general adult medical examination without abnormal findings: Secondary | ICD-10-CM | POA: Diagnosis not present

## 2017-03-28 DIAGNOSIS — Z982 Presence of cerebrospinal fluid drainage device: Secondary | ICD-10-CM | POA: Diagnosis not present

## 2017-03-28 DIAGNOSIS — G808 Other cerebral palsy: Secondary | ICD-10-CM | POA: Diagnosis not present

## 2017-03-28 DIAGNOSIS — Q039 Congenital hydrocephalus, unspecified: Secondary | ICD-10-CM

## 2017-03-28 DIAGNOSIS — G40209 Localization-related (focal) (partial) symptomatic epilepsy and epileptic syndromes with complex partial seizures, not intractable, without status epilepticus: Secondary | ICD-10-CM | POA: Diagnosis not present

## 2017-03-28 DIAGNOSIS — Z23 Encounter for immunization: Secondary | ICD-10-CM | POA: Diagnosis not present

## 2017-03-28 MED ORDER — OLOPATADINE HCL 0.2 % OP SOLN: 1.0000 [drp] | Freq: Every day | OPHTHALMIC | 5 refills | Status: DC

## 2017-03-28 MED ORDER — SULFAMETHOXAZOLE-TRIMETHOPRIM 800-160 MG PO TABS: 1.0000 | ORAL_TABLET | Freq: Two times a day (BID) | ORAL | 5 refills | Status: DC

## 2017-03-28 MED ORDER — FINASTERIDE 5 MG PO TABS: 5.0000 mg | ORAL_TABLET | Freq: Every day | ORAL | 0 refills | Status: DC

## 2017-03-28 MED ORDER — FLUOCINOLONE ACETONIDE 0.01 % OT OIL: TOPICAL_OIL | OTIC | 5 refills | Status: DC

## 2017-03-28 MED ORDER — FLUCONAZOLE 150 MG PO TABS: 150.0000 mg | ORAL_TABLET | ORAL | 0 refills | Status: DC | PRN

## 2017-03-28 NOTE — Progress Notes (Signed)
Subjective  Chief Complaint  Patient presents with  . Establish Care    HPI: Jody [name] is a 21 y.o. female who presents to [place] at [facility] today for a Female Wellness Visit, f/u medical problems and establish care. I saw her once in April of 2018 at my prior office.  She also has the concerns and/or needs as listed above in the chief complaint. These will be addressed in addition to the Health Maintenance Visit.   Wellness Visit: annual visit with health maintenance review and exam without Pap   This is a 21 year old female with hydrocephalus, cerebral palsy, wheelchair bound, history of migraine headaches who presents for physical.  I reviewed her recent notes from [facility] and [place].  She does have several factors as listed below.  She is here for physical and follow-up  Her main concern is her weight.  Over the last several years she has gained about 10 pounds.  She would like to lose weight she is trying to walk again.  She does do upper extremity exercises with weights 3-5 times per week.  However her diet is poor.  She is scheduled for 6 months, weeks with any foods.  He has not been able to her mother and do consistent heavy carbohydrates.  She has no history to.  She is not drinking beverages.  She will have snacks.  She does not want fast food  Neurologically she is stable.  Reviewed neurologic notes from care everywhere  Severe hidradenitis  Suppurativa: She is now on daily antibiotics. she is follow-up with dermatology.  She needs refills.  She also complains due to recurrent yeast infections with antibiotics.  Health maintenance-she has never been sexually.  She has had HPV vaccination series.  Therefore she is at extremely low risk for cervical cancer.  We discussed this and we both elect to defer cervical cancer screening at this time.  She is due for her flu shot tetanus booster.  She is due for routine blood work.  Seizure disorder:  Reviewed recent ER visit for seizure.  Reviewed lab work.  Patient has been stable since. Lifestyle: There is no height or weight on file to calculate BMI. Wt Readings from Last 3 Encounters:  07/26/14 160 lb (72.6 kg) (89 %, Z= 1.21)*  04/05/13 153 lb (69.4 kg) (87 %, Z= 1.13)*   * Growth percentiles are based on CDC (Girls, 2-20 Years) data.   Diet: general Exercise: intermittently, weightlifting Need for contraception: No, none Social History   Tobacco Use  Smoking Status Never Smoker  Smokeless Tobacco Never Used   Social History   Substance and Sexual Activity  Alcohol Use No     Patient Active Problem List   Diagnosis Date Noted  . VP (ventriculoperitoneal) shunt status 03/28/2017    Priority: High  . Cerebral palsy, diplegic ([place]) 02/06/2013    Priority: High  . Localization-related symptomatic epilepsy and epileptic syndromes with complex partial seizures, not intractable, without status epilepticus ([place]) 02/06/2013    Priority: High  . Congenital hydrocephalus ([place]) 05/26/2008    Priority: High  . Hidradenitis suppurativa 08/07/2014    Priority: Medium  . Migraine 02/06/2013    Priority: Medium  . Neurogenic bladder 10/17/2002    Priority: Medium  . Constipated 03/27/2015    Priority: Low  . Exotropia 05/21/2010    Priority: Low   Health Maintenance  Topic Date Due  . HIV Screening  10/16/2010  . TETANUS/TDAP  10/16/2014  .  PAP SMEAR  10/15/2016  . INFLUENZA VACCINE  11/30/2016   Immunization History  Administered Date(s) Administered  . DTaP 12/17/1995, 02/28/1996, 05/22/1996, 01/30/1997, 03/06/1997  . HPV Quadrivalent 09/12/2012, 11/14/2012, 03/19/2013  . Hepatitis A, Adult 02/21/2005, 01/24/2007  . Hepatitis B, adult 12/20/1995, 02/28/1996, 08/21/1996  . HiB (PRP-OMP) 12/18/1995, 02/28/1996, 05/22/1996, 01/30/1997  . IPV 12/17/1995, 02/28/1996, 10/30/1996, 11/08/1996  . Influenza,inj,Quad PF,6+ Mos 02/06/2013, 03/28/2017  . MMR 04/18/1997,  12/15/2000  . Meningococcal Conjugate 06/13/2007, 09/12/2012  . Td 03/28/2017  . Tdap 01/24/2007  . Varicella 04/18/1997, 03/13/2006   We updated and reviewed the patient's past history in detail and it is documented below. Allergies: Patient  reports that she does not drink alcohol. Past Medical History Patient  has a past medical history of History of cerebral palsy, History of constipation, History of encephalopathy, History of gastroesophageal reflux (GERD), History of hydrocephalus, History of migraine, History of neurogenic bladder, Hydradenitis, Hydrocephaly, Seizures ([place]), and VP (ventriculoperitoneal) shunt status. Past Surgical History Patient  has a past surgical history that includes shunt replacement (2008); Eye surgery; Hamstring lengthening; Tendon lengthening; Botox injection; Dental examination under anesthesia; and Ventriculoperitoneal shunt.  Social History: Patient  reports that  has never smoked. she has never used smokeless tobacco. She reports that she does not drink alcohol or use drugs.  Family History  Problem Relation Age of Onset  . Diabetes Mother   . Hyperlipidemia Mother   . Hypertension Mother   . Obesity Mother   . Diabetes Brother   . Obesity Brother     Review of Systems: Constitutional: negative for fever or malaise Ophthalmic: negative for photophobia, double vision or loss of vision Cardiovascular: negative for chest pain, dyspnea on exertion, or new LE swelling Respiratory: negative for SOB or persistent cough Gastrointestinal: negative for abdominal pain, change in bowel habits or melena Genitourinary: negative for dysuria or gross hematuria, no abnormal uterine bleeding or disharge Musculoskeletal: negative for new gait disturbance or muscular weakness Integumentary: negative for new or persistent rashes, no breast lumps Neurological: negative for TIA or stroke symptoms Psychiatric: negative for SI or delusions Allergic/Immunologic:  negative for hives  Patient Care Team    Relationship Specialty Notifications Start End  Leamon Arnt, MD PCP - General Family Medicine  03/28/17   Melanee Left, NP Nurse Practitioner Neurology  03/28/17   Enriqueta Shutter, MD Referring Physician Dermatology  03/28/17   Loleta Dicker., MD  Orthopedic Surgery  03/28/17     Objective  Vitals: BP 116/74 (BP Location: Right Arm, Patient Position: Sitting, Cuff Size: Large)   Pulse (!) 50   Temp 98.9 F (37.2 C) (Oral)   LMP 03/08/2017   SpO2 91%  General:  Well developed, well nourished, no acute distress , sitting in wheelchair Psych:  Alert and orientedx3,normal mood and affect HEENT:  Normocephalic, atraumatic, non-icteric sclera, PERRL, oropharynx is clear without mass or exudate, supple neck without adenopathy, mass or thyromegaly Cardiovascular:  Normal S1, S2, RRR without gallop, rub or murmur, nondisplaced PMI Respiratory:  Good breath sounds bilaterally, CTAB with normal respiratory effort Gastrointestinal: normal bowel sounds, soft, non-tender, no noted masses. No HSM MSK: Mildly atrophic lower extremities, joints are without erythema or swelling. Spine and CVA region are nontender Skin:  Warm, no rashes or suspicious lesions noted Neurologic:    Mental status is normal.  No tremor   No visits with results within 1 Day(s) from this visit.  Latest known visit with results is:  Admission on  11/03/2016, Discharged on 11/03/2016  Component Date Value Ref Range Status  . Sodium 11/03/2016 137  135 - 145 mmol/L Final  . Potassium 11/03/2016 5.2* 3.5 - 5.1 mmol/L Final  . Chloride 11/03/2016 110  101 - 111 mmol/L Final  . CO2 11/03/2016 18* 22 - 32 mmol/L Final  . Glucose, Bld 11/03/2016 84  65 - 99 mg/dL Final  . BUN 11/03/2016 9  6 - 20 mg/dL Final  . Creatinine, Ser 11/03/2016 0.88  0.44 - 1.00 mg/dL Final  . Calcium 11/03/2016 9.3  8.9 - 10.3 mg/dL Final  . GFR calc non Af Amer 11/03/2016 >60  >60 mL/min Final   . GFR calc Af Amer 11/03/2016 >60  >60 mL/min Final  . Anion gap 11/03/2016 9  5 - 15 Final  . Color, Urine 11/03/2016 STRAW* YELLOW Final  . APPearance 11/03/2016 CLEAR  CLEAR Final  . Specific Gravity, Urine 11/03/2016 1.009  1.005 - 1.030 Final  . pH 11/03/2016 8.0  5.0 - 8.0 Final  . Glucose, UA 11/03/2016 NEGATIVE  NEGATIVE mg/dL Final  . Hgb urine dipstick 11/03/2016 NEGATIVE  NEGATIVE Final  . Bilirubin Urine 11/03/2016 NEGATIVE  NEGATIVE Final  . Ketones, ur 11/03/2016 NEGATIVE  NEGATIVE mg/dL Final  . Protein, ur 11/03/2016 NEGATIVE  NEGATIVE mg/dL Final  . Nitrite 11/03/2016 NEGATIVE  NEGATIVE Final  . Leukocytes, UA 11/03/2016 NEGATIVE  NEGATIVE Final  . Preg Test, Ur 11/03/2016 NEGATIVE  NEGATIVE Final    Assessment  1. Annual physical exam   2. VP (ventriculoperitoneal) shunt status   3. Cerebral palsy, diplegic ([place])   4. Congenital hydrocephalus ([place])   5. Need for influenza vaccination   6. Need for tetanus booster   7. Localization-related symptomatic epilepsy and epileptic syndromes with complex partial seizures, not intractable, without status epilepticus (HCC) Chronic  8. Encounter for dental exam and cleaning w/o abnormal findings      Plan  Discussion: Female Wellness Visit:  Age appropriate Health Maintenance and Prevention measures were discussed with patient. Included topics are cancer screening recommendations, ways to keep healthy (see AVS) including dietary and exercise recommendations, regular eye and dental care, use of seat belts, and avoidance of moderate alcohol use and tobacco use.   BMI: discussed patient's BMI and encouraged positive lifestyle modifications to help get to or maintain a target BMI.  We discussed her diet and recommended dietary changes at length.  Please see AVS for specific details instructed.  Also discussed an exercise program.  HM needs and immunizations were addressed and ordered. See below for orders. See HM and  immunization section for updates..  Routine labs and screening tests ordered including cmp, cbc and lipids where appropriate.  Discussed recommendations regarding Vit D and calcium supplementation (see AVS)  Chronic disease f/u and/or acute problem visit: (deemed necessary to be done in addition to the wellness visit):  Neurologically: Stable.  Follow management per neurology  Referred to dentist  Return in about 6 months (around 09/25/2017).    Commons side effects, risks, benefits, and alternatives for medications and treatment plan prescribed today were discussed, and the patient expressed understanding of the given instructions. Patient is instructed to call or message via MyChart if he/she has any questions or concerns regarding our treatment plan. No barriers to understanding were identified. We discussed Red Flag symptoms and signs in detail. Patient expressed understanding regarding what to do in case of urgent or emergency type symptoms.   Medication list was reconciled, printed and provided to  the patient in AVS. Patient instructions and summary information was reviewed with the patient as documented in the AVS. This note was prepared with assistance of Dragon voice recognition software. Occasional wrong-word or sound-a-like substitutions may have occurred due to the inherent limitations of voice recognition software  Orders Placed This Encounter  Procedures  . Td : Tetanus/diphtheria >7yo Preservative  free  . Flu Vaccine QUAD 6+ mos PF IM (Fluarix Quad PF)  . CBC with Differential/Platelet  . Comprehensive metabolic panel  . Lipid panel  . HIV antibody  . Ambulatory referral to Dentistry   Meds ordered this encounter  Medications  . Olopatadine HCl 0.2 % SOLN    Sig: Place 1 drop into both eyes daily.    Dispense:  2.5 mL    Refill:  5  . Fluocinolone Acetonide 0.01 % OIL    Sig: Apply to the skin 2-3 times daily as directed    Dispense:  1 Bottle    Refill:  5  .  sulfamethoxazole-trimethoprim (BACTRIM DS,SEPTRA DS) 800-160 MG tablet    Sig: Take 1 tablet by mouth 2 (two) times daily.    Dispense:  60 tablet    Refill:  5  . finasteride (PROSCAR) 5 MG tablet    Sig: Take 1 tablet (5 mg total) by mouth daily.    Dispense:  90 tablet    Refill:  0  . fluconazole (DIFLUCAN) 150 MG tablet    Sig: Take 1 tablet (150 mg total) by mouth as needed (once for yeast infections).    Dispense:  30 tablet    Refill:  0

## 2017-03-28 NOTE — Patient Instructions (Addendum)
Please return in 6 months to follow up on nutrition.  Please do not skip breakfast!! It is important to get your metabolism going again with breakfast in the morning.  Limit fast food.   Fat and Cholesterol Restricted Diet Getting too much fat and cholesterol in your diet may cause health problems. Following this diet helps keep your fat and cholesterol at normal levels. This can keep you from getting sick. What types of fat should I choose?  Choose monosaturated and polyunsaturated fats. These are found in foods such as olive oil, canola oil, flaxseeds, walnuts, almonds, and seeds.  Eat more omega-3 fats. Good choices include salmon, mackerel, sardines, tuna, flaxseed oil, and ground flaxseeds.  Limit saturated fats. These are in animal products such as meats, butter, and cream. They can also be in plant products such as palm oil, palm kernel oil, and coconut oil.  Avoid foods with partially hydrogenated oils in them. These contain trans fats. Examples of foods that have trans fats are stick margarine, some tub margarines, cookies, crackers, and other baked goods. What general guidelines do I need to follow?  Check food labels. Look for the words "trans fat" and "saturated fat."  When preparing a meal: ? Fill half of your plate with vegetables and green salads. ? Fill one fourth of your plate with whole grains. Look for the word "whole" as the first word in the ingredient list. ? Fill one fourth of your plate with lean protein foods.  Eat more foods that have fiber, like apples, carrots, beans, peas, and barley.  Eat more home-cooked foods. Eat less at restaurants and buffets.  Limit or avoid alcohol.  Limit foods high in starch and sugar.  Limit fried foods.  Cook foods without frying them. Baking, boiling, grilling, and broiling are all great options.  Lose weight if you are overweight. Losing even a small amount of weight can help your overall health. It can also help  prevent diseases such as diabetes and heart disease. What foods can I eat? Grains Whole grains, such as whole wheat or whole grain breads, crackers, cereals, and pasta. Unsweetened oatmeal, bulgur, barley, quinoa, or brown rice. Corn or whole wheat flour tortillas. Vegetables Fresh or frozen vegetables (raw, steamed, roasted, or grilled). Green salads. Fruits All fresh, canned (in natural juice), or frozen fruits. Meat and Other Protein Products Ground beef (85% or leaner), grass-fed beef, or beef trimmed of fat. Skinless chicken or [place]. Ground chicken or [place]. Pork trimmed of fat. All fish and seafood. Eggs. Dried beans, peas, or lentils. Unsalted nuts or seeds. Unsalted canned or dry beans. Dairy Low-fat dairy products, such as skim or 1% milk, 2% or reduced-fat cheeses, low-fat ricotta or cottage cheese, or plain low-fat yogurt. Fats and Oils Tub margarines without trans fats. Light or reduced-fat mayonnaise and salad dressings. Avocado. Olive, canola, sesame, or safflower oils. Natural peanut or almond butter (choose ones without added sugar and oil). The items listed above may not be a complete list of recommended foods or beverages. Contact your dietitian for more options. What foods are not recommended? Grains White bread. White pasta. White rice. Cornbread. Bagels, pastries, and croissants. Crackers that contain trans fat. Vegetables White potatoes. Corn. Creamed or fried vegetables. Vegetables in a cheese sauce. Fruits Dried fruits. Canned fruit in light or heavy syrup. Fruit juice. Meat and Other Protein Products Fatty cuts of meat. Ribs, chicken wings, bacon, sausage, bologna, salami, chitterlings, fatback, hot dogs, bratwurst, and packaged luncheon meats. Liver and organ  meats. Dairy Whole or 2% milk, cream, half-and-half, and cream cheese. Whole milk cheeses. Whole-fat or sweetened yogurt. Full-fat cheeses. Nondairy creamers and whipped toppings. Processed cheese, cheese  spreads, or cheese curds. Sweets and Desserts Corn syrup, sugars, honey, and molasses. Candy. Jam and jelly. Syrup. Sweetened cereals. Cookies, pies, cakes, donuts, muffins, and ice cream. Fats and Oils Butter, stick margarine, lard, shortening, ghee, or bacon fat. Coconut, palm kernel, or palm oils. Beverages Alcohol. Sweetened drinks (such as sodas, lemonade, and fruit drinks or punches). The items listed above may not be a complete list of foods and beverages to avoid. Contact your dietitian for more information. This information is not intended to replace advice given to you by your health care provider. Make sure you discuss any questions you have with your health care provider. Document Released: 10/18/2011 Document Revised: 12/24/2015 Document Reviewed: 07/18/2013 Elsevier Interactive Patient Education  [employer]2018 Elsevier Inc.

## 2017-03-28 NOTE — Progress Notes (Signed)
error 

## 2017-03-30 ENCOUNTER — Telehealth: Payer: Self-pay | Admitting: Family Medicine

## 2017-03-30 NOTE — Telephone Encounter (Signed)
Copied from CRM [phone]. Topic: Quick Communication - See Telephone Encounter >> Mar 30, 2017  2:14 PM Trula SladeWalter, Linda F wrote: CRM for notification. See Telephone encounter for:  03/30/17. Patient's mom [phone] stated the [facility] an [facility] and Dr. Wells Guilesarolyn Zook put in an order to have her labs done as well as the order that was in the system from Dr. Durward Mallardamille for labs.  So she will not take her daughter to the appt tomorrow because the labs have already been drawn and in the system w/Dr. Wetzel BjornstadZook.

## 2017-04-03 ENCOUNTER — Encounter: Payer: Self-pay | Admitting: Family Medicine

## 2017-04-03 ENCOUNTER — Ambulatory Visit: Payer: Medicare HMO | Admitting: Physical Therapy

## 2017-04-03 DIAGNOSIS — D509 Iron deficiency anemia, unspecified: Secondary | ICD-10-CM | POA: Insufficient documentation

## 2017-04-03 DIAGNOSIS — E559 Vitamin D deficiency, unspecified: Secondary | ICD-10-CM

## 2017-04-03 HISTORY — DX: Vitamin D deficiency, unspecified: E55.9

## 2017-04-03 MED ORDER — VITAMIN D (ERGOCALCIFEROL) 1.25 MG (50000 UNIT) PO CAPS: 50000.0000 [IU] | ORAL_CAPSULE | ORAL | 0 refills | Status: DC

## 2017-04-03 NOTE — Telephone Encounter (Signed)
Please call patient: I have reviewed her lab results:  All look fine except for: Hgb is low showing anemia - this is likely due to her menstrual cycles. Please start otc iron twice a day to improve this.   Also, vit D levels are low. I have ordered Vit d replacement to be taken weekly for 12 weeks; can also start daily Vit D at 1000 - 2000 units daily.   Thanks!

## 2017-04-03 NOTE — Addendum Note (Signed)
Addended by: Asencion PartridgeANDY, Katerin Negrete on: 04/03/2017 08:32 AM   Modules accepted: Orders

## 2017-04-03 NOTE — Telephone Encounter (Signed)
Spoke to pt's mother Jody [name], told her Dr. Mardelle MatteAndy reviewed labs and all look fine except for Hgb is low showing anemia - this is likely due to her menstrual cycles. She would like her to start otc iron twice a day to improve this. Also, vit D levels are low. She has ordered Vit d replacement to be taken weekly for 12 weeks Rx was sent to pharmacy. She would  also like her to start daily Vit D at 1000 - 2000 units daily. [name] verbalized understanding.

## 2017-04-05 ENCOUNTER — Telehealth: Payer: Self-pay | Admitting: Family Medicine

## 2017-04-05 NOTE — Telephone Encounter (Signed)
Pt's mother is calling regarding the medication Vitamin D. Pt was put on Vitamin D [id number] units weekly for 12 weeks [place]. Originally she also has an order for Vitamin D3 400 units daily. The mother said that she was told to increase this Vitamin D to 1000 - 2000 units daily. She was driving at the time and did not remember what was said exactly. She wants to know if she should continue with the daily of 1000 or 2000 daily. She also wants to know if the Vitamin D3 need to be with iron. The  one she had originally had iron in it. She gave her daughter 1000 units on Monday and Tuesday. She thought maybe she was giving her too much.

## 2017-04-05 NOTE — Telephone Encounter (Signed)
Left message on voicemail to call office. Pt is to take Vit D3 1000 to 2000 IU's daily with out iron along with the once a week Vit D and also to be taking OTC Iron supplelment twice a day per Dr. Mardelle MatteAndy.

## 2017-04-06 ENCOUNTER — Telehealth: Payer: Self-pay | Admitting: Family Medicine

## 2017-04-06 NOTE — Telephone Encounter (Signed)
Mother called back and stated that blood sugar before 76 and rechecked and it was 79 before lunch.

## 2017-04-06 NOTE — Telephone Encounter (Signed)
Mother called back and stated that blood sugar before 76 and rechecked and it was 79 before lunch.  

## 2017-04-06 NOTE — Telephone Encounter (Signed)
Please see message and advise regarding blood sugars.

## 2017-04-06 NOTE — Telephone Encounter (Signed)
Tried to contact pt's mother voice mailbox is full. Will try again later.

## 2017-04-06 NOTE — Telephone Encounter (Signed)
Given instructions regarding Vit D 3 and OTC iron supplement. Mother states since Monday pt has had "excessive thirst  and urination. " Mom is a diabetic and states that pt is at work. Mother is going to go to pt's work to check her blood sugar and will call back with results.

## 2017-04-06 NOTE — Telephone Encounter (Signed)
Reassure mother. Patient has normal blood sugars. To be certain, she can check in the am before [name] has eaten - if it is less than 126, it is normal.  Thanks.

## 2017-04-07 NOTE — Telephone Encounter (Signed)
Tried to contact pt's mother Jody [name] again and voicemail box is still full unable to leave message.

## 2017-04-10 ENCOUNTER — Ambulatory Visit: Payer: Medicare HMO | Admitting: Physical Therapy

## 2017-04-12 NOTE — Telephone Encounter (Signed)
Spoke to pt's mother Jody [name], told her Dr. Mardelle MatteAndy said pt has normal blood sugars. To be certain if concerned you can check in the AM before [name] has eaten. If it is less than 126 it is normal. [name] verbalized understanding.

## 2017-04-17 ENCOUNTER — Ambulatory Visit: Payer: Medicare HMO | Admitting: Physical Therapy

## 2017-04-24 ENCOUNTER — Ambulatory Visit: Payer: Medicare HMO | Admitting: Physical Therapy

## 2017-05-25 ENCOUNTER — Telehealth: Payer: Self-pay | Admitting: Family Medicine

## 2017-05-25 NOTE — Telephone Encounter (Signed)
Spoke with mom in detail and explained that Jody [name] having [employer] may create a barrier to her care here since we are not in network with [employer]. Mom asked that we try and sign the form for Jody [name] as she is out of incontinence supplies and it is imperative for her quality of life. Mom knows she will have to have Jody [name] in every 90 days but since there were seen at the end of November she should still qualify. I asked her to have the forms refaxed.

## 2017-05-25 NOTE — Telephone Encounter (Signed)
Marchelle Folksmanda- Please review message and contact Patients mother.

## 2017-05-25 NOTE — Telephone Encounter (Unsigned)
Copied from CRM [id number]. Topic: Quick Communication - See Telephone Encounter >> May 25, 2017 10:04 AM Waymon AmatoBurton, Donna F wrote: CRM for notification. See Telephone encounter for: pt is needing to talk with someone Today regarding [facility] supplies [facility]  mom states that [facility] stated that the supplies request was denied because she is due for a visit but the mom is stating that they just saw Dr. Mardelle MatteAndy on 03/28/17 and she is down to only two boxes of supplies and they needs this handled today   Pts best number [phone] 01/24

## 2017-05-25 NOTE — Telephone Encounter (Signed)
Spoke with Mother, Tamala JulianGvonnia regarding patient, she states that she needs supplies for her daughter, informed Tamala JulianGvonnia that we do not take their insurance and needs to contact Medicaid for a new PCP. We are unable to order supplies, [name] was upset and has questions. Informed her that Office Adminstrator Marchelle Folksmanda will call her back and discuss. [name] verbalized understanding

## 2017-09-01 ENCOUNTER — Ambulatory Visit (INDEPENDENT_AMBULATORY_CARE_PROVIDER_SITE_OTHER): Payer: Medicare HMO | Admitting: Family Medicine

## 2017-09-01 ENCOUNTER — Encounter: Payer: Self-pay | Admitting: Family Medicine

## 2017-09-01 ENCOUNTER — Ambulatory Visit: Payer: Self-pay

## 2017-09-01 ENCOUNTER — Other Ambulatory Visit: Payer: Self-pay

## 2017-09-01 VITALS — BP 128/80 | HR 92 | Temp 97.9°F | Ht 65.0 in

## 2017-09-01 DIAGNOSIS — E559 Vitamin D deficiency, unspecified: Secondary | ICD-10-CM | POA: Diagnosis not present

## 2017-09-01 DIAGNOSIS — M25572 Pain in left ankle and joints of left foot: Secondary | ICD-10-CM | POA: Diagnosis not present

## 2017-09-01 DIAGNOSIS — D509 Iron deficiency anemia, unspecified: Secondary | ICD-10-CM | POA: Diagnosis not present

## 2017-09-01 DIAGNOSIS — R358 Other polyuria: Secondary | ICD-10-CM | POA: Diagnosis not present

## 2017-09-01 DIAGNOSIS — R3589 Other polyuria: Secondary | ICD-10-CM

## 2017-09-01 DIAGNOSIS — N926 Irregular menstruation, unspecified: Secondary | ICD-10-CM

## 2017-09-01 DIAGNOSIS — R600 Localized edema: Secondary | ICD-10-CM | POA: Diagnosis not present

## 2017-09-01 NOTE — Telephone Encounter (Signed)
Patient's mother called with c/o "leg swelling." She says "they have been swelling for a while, but now she's complains of pain when I tough them. Her right leg is swollen, hard, indents when I press my finger; left leg is swollen, but not hard. Both feet are so swollen I can't get her shoes on. She has no fever, no redness to the legs." I asked about difficulty breathing, chest pain, she says "no." According to protocol, see PCP within 24 hours, appointment scheduled for today at 1600 with Dr. Mardelle Matte, care advice given, mother verbalized understanding.  Reason for Disposition . [1] MODERATE leg swelling (e.g., swelling extends up to knees) AND [2] new onset or worsening  Answer Assessment - Initial Assessment Questions 1. ONSET: "When did the swelling start?" (e.g., minutes, hours, days)     For a while 2. LOCATION: "What part of the leg is swollen?"  "Are both legs swollen or just one leg?"     Both legs, feet 3. SEVERITY: "How bad is the swelling?" (e.g., localized; mild, moderate, severe)  - Localized - small area of swelling localized to one leg  - MILD pedal edema - swelling limited to foot and ankle, pitting edema < 1/4 inch (6 mm) deep, rest and elevation eliminate most or all swelling  - MODERATE edema - swelling of lower leg to knee, pitting edema > 1/4 inch (6 mm) deep, rest and elevation only partially reduce swelling  - SEVERE edema - swelling extends above knee, facial or hand swelling present      Moderate 4. REDNESS: "Does the swelling look red or infected?"     No 5. PAIN: "Is the swelling painful to touch?" If so, ask: "How painful is it?"   (Scale 1-10; mild, moderate or severe)     Yes, unsure of the number 6. FEVER: "Do you have a fever?" If so, ask: "What is it, how was it measured, and when did it start?"      No 7. CAUSE: "What do you think is causing the leg swelling?"     I don't know 8. MEDICAL HISTORY: "Do you have a history of heart failure, kidney disease, liver  failure, or cancer?"     No 9. RECURRENT SYMPTOM: "Have you had leg swelling before?" If so, ask: "When was the last time?" "What happened that time?"     No 10. OTHER SYMPTOMS: "Do you have any other symptoms?" (e.g., chest pain, difficulty breathing)       No 11. PREGNANCY: "Is there any chance you are pregnant?" "When was your last menstrual period?"       No  Protocols used: LEG SWELLING AND EDEMA-A-AH

## 2017-09-01 NOTE — Progress Notes (Signed)
Subjective  CC:  Chief Complaint  Patient presents with  . Foot Swelling    Ankle & Leg Swelling x 2 weeks     HPI: Jody [name] is a 22 y.o. female who presents to the office today to address the problems listed above in the chief complaint. C/o increased swelling in both legs and now with left ankle pain. Notices when she partially bears weight on that side. No injury or falls. She is wheel chair dependent.  She denies changes in diet or medications. No sob or palpitations. Weight is stable. Denies h/o edema in legs. No calf pain.  Anemia and vit D defic dxd in November. Started supplements in December.  Irregular vaginal bleeding: typically has regular 4-7 day menstrual cycles but in February had heavy bleeding for 4 weeks. No pain. Not sexually active. Had nl menses last month. Mom thought irregular bleeding was due to iron tx so stopped it in march.  I reviewed the patients updated PMH, FH, and SocHx.    Patient Active Problem List   Diagnosis Date Noted  . VP (ventriculoperitoneal) shunt status 03/28/2017    Priority: High  . Cerebral palsy, diplegic (HCC) 02/06/2013    Priority: High  . Localization-related symptomatic epilepsy and epileptic syndromes with complex partial seizures, not intractable, without status epilepticus (HCC) 02/06/2013    Priority: High  . Congenital hydrocephalus (HCC) 05/26/2008    Priority: High  . Hidradenitis suppurativa 08/07/2014    Priority: Medium  . Migraine 02/06/2013    Priority: Medium  . Neurogenic bladder 10/17/2002    Priority: Medium  . Constipated 03/27/2015    Priority: Low  . Exotropia 05/21/2010    Priority: Low  . Microcytic anemia 04/03/2017  . Vitamin D deficiency 04/03/2017   Current Meds  Medication Sig  . baclofen (LIORESAL) 10 MG tablet Take 10 mg by mouth 2 (two) times daily. 5 mg in the morning and 15 mg at night  . Cholecalciferol (VITAMIN D3 PO) Take 400 Units by mouth daily.  . cyclobenzaprine (FLEXERIL) 5 MG  tablet Take 5 mg by mouth every 8 (eight) hours. For 7 days.  . diazepam (DIASTAT ACUDIAL) 20 MG GEL Place 15 mg rectally once as needed (seizures).  . finasteride (PROSCAR) 5 MG tablet Take 1 tablet (5 mg total) by mouth daily.  . fluconazole (DIFLUCAN) 150 MG tablet Take 1 tablet (150 mg total) by mouth as needed (once for yeast infections).  . Fluocinolone Acetonide 0.01 % OIL Apply to the skin 2-3 times daily as directed  . Fluocinolone Acetonide Body 0.01 % OIL APPLY TO THE SKIN 2-3 TIMES D UTD  . ibuprofen (ADVIL,MOTRIN) 800 MG tablet Take 800 mg by mouth every 8 (eight) hours as needed for headache, mild pain or moderate pain.   . midazolam (VERSED) 10 MG/2ML SOLN injection Draw up 2 mL (10 mg total) and give 1 ml to each nostril or between cheek and gums PRN seizure lasting > 5 minutes. Call 911 after giving  . Olopatadine HCl 0.2 % SOLN Place 1 drop into both eyes daily.  . ondansetron (ZOFRAN-ODT) 4 MG disintegrating tablet Take 1 tablet (4 mg total) by mouth every 8 (eight) hours as needed for nausea or vomiting.  . propranolol (INDERAL) 20 MG tablet Take 20 mg by mouth 2 (two) times daily.  . rizatriptan (MAXALT) 10 MG tablet Take 10 mg by mouth as needed for migraine. May repeat in 2 hours if needed  . sulfamethoxazole-trimethoprim (BACTRIM DS,SEPTRA DS)  800-160 MG tablet Take 1 tablet by mouth 2 (two) times daily.  . Vitamin D, Ergocalciferol, (DRISDOL) 50000 units CAPS capsule Take 1 capsule (50,000 Units total) by mouth once a week. For 12 weeks, then stop.  Marland Kitchen zonisamide (ZONEGRAN) 100 MG capsule Take 200 mg by mouth 2 (two) times daily.     Allergies: Patient is allergic to ceftibuten; fentanyl; and tape. Family History: Patient family history includes Diabetes in her brother and mother; Hyperlipidemia in her mother; Hypertension in her mother; Obesity in her brother and mother. Social History:  Patient  reports that she has never smoked. She has never used smokeless tobacco.  She reports that she does not drink alcohol or use drugs.  Review of Systems: Constitutional: Negative for fever malaise or anorexia Cardiovascular: negative for chest pain Respiratory: negative for SOB or persistent cough Gastrointestinal: negative for abdominal pain  Objective  Vitals: BP 128/80   Pulse 92   Temp 97.9 F (36.6 C)   Ht  (1.651 m)   LMP 08/26/2017   BMI 26.63 kg/m  General: no acute distress , A&Ox3, no respiratory distress HEENT: PEERL, conjunctiva pale bilaterally, Oropharynx moist,neck is supple Cardiovascular:  RRR without murmur or gallop.  Respiratory:  Good breath sounds bilaterally, CTAB with normal respiratory effort ABD: soft, nontender Ext: + nonpitting edema to mid - shin. No cords or calf ttp. Left medial ankle ttp; +2 distal pulses; pale nail beds.  Skin:  Warm, no rashes  Assessment  1. Lower extremity edema   2. Microcytic anemia   3. Acute left ankle pain   4. Polyuria   5. Vitamin D deficiency   6. Irregular menstrual bleeding      Plan   LE edema:  Start evaluation. Check thyroid, renal and liver function. Pending results, will use diuretic as needed. Elevated legs. Check albumin.   Ankle pain: advil; check xray  Polyuria; r/o diabetes with a1c.  Recheck vit d levels  Anemia: recheck. Restart iron.   If irreguar menses returns, rec gyn eval.   Follow up: as scheduled in may for f/u.    Commons side effects, risks, benefits, and alternatives for medications and treatment plan prescribed today were discussed, and the patient expressed understanding of the given instructions. Patient is instructed to call or message via MyChart if he/she has any questions or concerns regarding our treatment plan. No barriers to understanding were identified. We discussed Red Flag symptoms and signs in detail. Patient expressed understanding regarding what to do in case of urgent or emergency type symptoms.   Medication list was reconciled,  printed and provided to the patient in AVS. Patient instructions and summary information was reviewed with the patient as documented in the AVS. This note was prepared with assistance of Dragon voice recognition software. Occasional wrong-word or sound-a-like substitutions may have occurred due to the inherent limitations of voice recognition software  Orders Placed This Encounter  Procedures  . DG Ankle Complete Left  . Iron, TIBC and Ferritin Panel  . CBC with Differential/Platelet  . Comprehensive metabolic panel  . Hemoglobin A1c  . TSH  . Vitamin D (25 hydroxy)   No orders of the defined types were placed in this encounter.

## 2017-09-01 NOTE — Patient Instructions (Signed)
Please return in 09/22/2017   If you have any questions or concerns, please don't hesitate to send me a message via MyChart or call the office at [phone]. Thank you for visiting with [place] today! It's our pleasure caring for you.  I will get the results of your blood work back, then order medication based on those results.   Please go to our [facility] office to get your xrays done. You can walk in M-F between 8am and 5pm. Tell them you are there for xrays ordered by me. They will send me the results, then I will let you know the results with instructions.   Address: [address] [place], [place], [place] [phone]  (office sits at [address] at [address] intersection; from here, turn left onto [place] [address]), take to [address], turn right and go for a mile or so, office will be on left across form [address] )

## 2017-09-02 LAB — CBC WITH DIFFERENTIAL/PLATELET
BASOS PCT: 0.2 %
Basophils Absolute: 17 cells/uL (ref 0–200)
Eosinophils Absolute: 68 cells/uL (ref 15–500)
Eosinophils Relative: 0.8 %
HEMATOCRIT: 29.8 % — AB (ref 35.0–45.0)
HEMOGLOBIN: 8.9 g/dL — AB (ref 11.7–15.5)
LYMPHS ABS: 1896 {cells}/uL (ref 850–3900)
MCH: 20.3 pg — ABNORMAL LOW (ref 27.0–33.0)
MCHC: 29.9 g/dL — ABNORMAL LOW (ref 32.0–36.0)
MCV: 67.9 fL — ABNORMAL LOW (ref 80.0–100.0)
MPV: 11.3 fL (ref 7.5–12.5)
Monocytes Relative: 7.6 %
NEUTROS PCT: 69.1 %
Neutro Abs: 5874 cells/uL (ref 1500–7800)
Platelets: 419 10*3/uL — ABNORMAL HIGH (ref 140–400)
RBC: 4.39 10*6/uL (ref 3.80–5.10)
RDW: 18.6 % — ABNORMAL HIGH (ref 11.0–15.0)
Total Lymphocyte: 22.3 %
WBC: 8.5 10*3/uL (ref 3.8–10.8)
WBCMIX: 646 {cells}/uL (ref 200–950)

## 2017-09-02 LAB — COMPREHENSIVE METABOLIC PANEL
AG RATIO: 1.6 (calc) (ref 1.0–2.5)
ALBUMIN MSPROF: 4.2 g/dL (ref 3.6–5.1)
ALT: 17 U/L (ref 6–29)
AST: 13 U/L (ref 10–30)
Alkaline phosphatase (APISO): 55 U/L (ref 33–115)
BILIRUBIN TOTAL: 0.2 mg/dL (ref 0.2–1.2)
BUN: 10 mg/dL (ref 7–25)
CO2: 19 mmol/L — ABNORMAL LOW (ref 20–32)
CREATININE: 0.81 mg/dL (ref 0.50–1.10)
Calcium: 9.2 mg/dL (ref 8.6–10.2)
Chloride: 108 mmol/L (ref 98–110)
Globulin: 2.6 g/dL (calc) (ref 1.9–3.7)
Glucose, Bld: 113 mg/dL — ABNORMAL HIGH (ref 65–99)
Potassium: 4.4 mmol/L (ref 3.5–5.3)
SODIUM: 139 mmol/L (ref 135–146)
TOTAL PROTEIN: 6.8 g/dL (ref 6.1–8.1)

## 2017-09-02 LAB — HEMOGLOBIN A1C
EAG (MMOL/L): 6.2 (calc)
Hgb A1c MFr Bld: 5.5 % of total Hgb (ref <5.7)
Mean Plasma Glucose: 111 (calc)

## 2017-09-02 LAB — IRON,TIBC AND FERRITIN PANEL
%SAT: 3 % (calc) — ABNORMAL LOW (ref 11–50)
Ferritin: 3 ng/mL — ABNORMAL LOW (ref 10–154)
IRON: 14 ug/dL — AB (ref 40–190)
TIBC: 439 mcg/dL (calc) (ref 250–450)

## 2017-09-02 LAB — VITAMIN D 25 HYDROXY (VIT D DEFICIENCY, FRACTURES): VIT D 25 HYDROXY: 30 ng/mL (ref 30–100)

## 2017-09-02 LAB — TSH: TSH: 4.58 m[IU]/L — AB

## 2017-09-02 LAB — CBC MORPHOLOGY

## 2017-09-04 ENCOUNTER — Other Ambulatory Visit: Payer: Self-pay | Admitting: Family Medicine

## 2017-09-04 ENCOUNTER — Other Ambulatory Visit: Payer: Self-pay | Admitting: Emergency Medicine

## 2017-09-04 DIAGNOSIS — N921 Excessive and frequent menstruation with irregular cycle: Secondary | ICD-10-CM

## 2017-09-04 DIAGNOSIS — N926 Irregular menstruation, unspecified: Secondary | ICD-10-CM

## 2017-09-04 MED ORDER — IBUPROFEN 800 MG PO TABS: 800.0000 mg | ORAL_TABLET | Freq: Three times a day (TID) | ORAL | 2 refills | Status: DC | PRN

## 2017-09-04 NOTE — Telephone Encounter (Signed)
Spoke to mom regarding referral for OB-Gyn, [facility] is not in network with [employer] (pt 2nd insurance). Mom is ok with sending referral to [facility], mom also asked if this Rx had been called. I advise mom that is had not been called in yet and that I would get a message back to provider regarding refill request.

## 2017-09-04 NOTE — Telephone Encounter (Signed)
Copied from CRM [id number]. Topic: Quick Communication - See Telephone Encounter >> Sep 04, 2017  9:34 AM Diana Eves B wrote: CRM for notification. See Telephone encounter for: 09/04/17.  Pts mom calling checking on the ibuprofen (ADVIL,MOTRIN) 800 MG tablet being called in to [id number] - [place] - [address] AT [facility] OF [address]

## 2017-09-04 NOTE — Telephone Encounter (Signed)
Ok to Refill? Patient's mom is requesting.   Kathi Simpers,  LPN

## 2017-09-08 ENCOUNTER — Other Ambulatory Visit: Payer: Self-pay | Admitting: *Deleted

## 2017-09-08 ENCOUNTER — Ambulatory Visit (INDEPENDENT_AMBULATORY_CARE_PROVIDER_SITE_OTHER): Payer: Medicare HMO

## 2017-09-08 DIAGNOSIS — M25572 Pain in left ankle and joints of left foot: Secondary | ICD-10-CM | POA: Diagnosis not present

## 2017-09-08 DIAGNOSIS — R600 Localized edema: Secondary | ICD-10-CM | POA: Diagnosis not present

## 2017-09-08 DIAGNOSIS — M25571 Pain in right ankle and joints of right foot: Secondary | ICD-10-CM

## 2017-09-11 NOTE — Progress Notes (Signed)
Please call patient: I have reviewed his/her xray results. The ankle and bones look ok; no acute problems identified. There is some thinning of the bones in the legs as we would expect due to her disuse. There is bilateral swelling as we saw on exam. Use the diuretic as we discussed and will recheck at next office visit . 6/25.

## 2017-09-12 ENCOUNTER — Other Ambulatory Visit: Payer: Self-pay | Admitting: Family Medicine

## 2017-09-12 ENCOUNTER — Telehealth: Payer: Self-pay | Admitting: Emergency Medicine

## 2017-09-12 MED ORDER — FUROSEMIDE 20 MG PO TABS: 20.0000 mg | ORAL_TABLET | Freq: Every day | ORAL | 1 refills | Status: DC | PRN

## 2017-09-12 MED ORDER — POTASSIUM CHLORIDE CRYS ER 10 MEQ PO TBCR: 10.0000 meq | EXTENDED_RELEASE_TABLET | Freq: Every day | ORAL | 1 refills | Status: DC | PRN

## 2017-09-12 NOTE — Progress Notes (Signed)
Ordered lasix

## 2017-09-12 NOTE — Progress Notes (Signed)
Ordered lasix to be used prn for leg swelling

## 2017-09-12 NOTE — Telephone Encounter (Signed)
Please advise 

## 2017-09-12 NOTE — Telephone Encounter (Signed)
Reason for CRM: patient mother is calling and states that she spoke with Tareka Jhaveri this morning and was instructed on what to do and was told that Dr. Mardelle Matte wanted the patient to start on a directic pill but it has not been called in. Please advise.     Patient's mother informed that Prescription has been sent to the pharmacy per Dr. Mardelle Matte.   Kathi Simpers,  LPN

## 2017-09-14 ENCOUNTER — Telehealth: Payer: Self-pay | Admitting: General Practice

## 2017-09-14 NOTE — Telephone Encounter (Signed)
Left message for patient to give our office a call back in regards to appointment scheduled for 10/05/17 at 2:30pm.

## 2017-09-18 ENCOUNTER — Ambulatory Visit: Payer: Medicare HMO | Admitting: Family Medicine

## 2017-09-22 ENCOUNTER — Ambulatory Visit: Payer: Medicare HMO | Admitting: Family Medicine

## 2017-10-05 ENCOUNTER — Encounter: Payer: Medicare HMO | Admitting: Obstetrics and Gynecology

## 2017-10-05 ENCOUNTER — Encounter: Payer: Self-pay | Admitting: General Practice

## 2017-10-05 NOTE — Progress Notes (Signed)
Please call pt: she did not keep her Gyn appt? Will she reschedule?

## 2017-10-09 ENCOUNTER — Telehealth: Payer: Self-pay | Admitting: Emergency Medicine

## 2017-10-09 NOTE — Telephone Encounter (Signed)
Spoke with Patient's mother, she states she was unaware of Gynecology appt, I provided patient with number and she states she will call to reschedule.   Kathi SimpersAmy Harless Molinari,  LPN

## 2017-10-09 NOTE — Telephone Encounter (Signed)
-----   Message from Willow Oraamille L Andy, MD sent at 10/05/2017  4:59 PM EDT -----   ----- Message ----- From: Marti SleighMalloy, Sctaria J, NT Sent: 10/05/2017   2:52 PM To: Willow Oraamille L Andy, MD

## 2017-10-18 ENCOUNTER — Other Ambulatory Visit: Payer: Self-pay | Admitting: Family Medicine

## 2017-10-18 NOTE — Telephone Encounter (Signed)
Last OV 09/01/17, Next OV 10/24/17  Last filled by historical provider

## 2017-10-24 ENCOUNTER — Encounter: Payer: Self-pay | Admitting: Family Medicine

## 2017-10-24 ENCOUNTER — Other Ambulatory Visit: Payer: Medicare HMO

## 2017-10-24 ENCOUNTER — Ambulatory Visit (INDEPENDENT_AMBULATORY_CARE_PROVIDER_SITE_OTHER): Payer: Medicare HMO | Admitting: Family Medicine

## 2017-10-24 ENCOUNTER — Ambulatory Visit (INDEPENDENT_AMBULATORY_CARE_PROVIDER_SITE_OTHER): Payer: Medicare HMO | Admitting: Obstetrics and Gynecology

## 2017-10-24 ENCOUNTER — Other Ambulatory Visit: Payer: Self-pay

## 2017-10-24 ENCOUNTER — Encounter: Payer: Self-pay | Admitting: Obstetrics and Gynecology

## 2017-10-24 ENCOUNTER — Ambulatory Visit: Payer: Medicare HMO | Admitting: Family Medicine

## 2017-10-24 VITALS — BP 110/78 | HR 102 | Temp 97.5°F | Resp 16

## 2017-10-24 DIAGNOSIS — D500 Iron deficiency anemia secondary to blood loss (chronic): Secondary | ICD-10-CM

## 2017-10-24 DIAGNOSIS — L732 Hidradenitis suppurativa: Secondary | ICD-10-CM | POA: Diagnosis not present

## 2017-10-24 DIAGNOSIS — N939 Abnormal uterine and vaginal bleeding, unspecified: Secondary | ICD-10-CM | POA: Diagnosis not present

## 2017-10-24 DIAGNOSIS — R600 Localized edema: Secondary | ICD-10-CM | POA: Diagnosis not present

## 2017-10-24 DIAGNOSIS — N926 Irregular menstruation, unspecified: Secondary | ICD-10-CM

## 2017-10-24 LAB — CBC WITH DIFFERENTIAL/PLATELET
BASOS ABS: 0 10*3/uL (ref 0.0–0.1)
Basophils Relative: 0.5 % (ref 0.0–3.0)
EOS PCT: 0.8 % (ref 0.0–5.0)
Eosinophils Absolute: 0.1 10*3/uL (ref 0.0–0.7)
HCT: 34.4 % — ABNORMAL LOW (ref 36.0–46.0)
Hemoglobin: 10.5 g/dL — ABNORMAL LOW (ref 12.0–15.0)
LYMPHS ABS: 1.3 10*3/uL (ref 0.7–4.0)
Lymphocytes Relative: 16.6 % (ref 12.0–46.0)
MCHC: 30.6 g/dL (ref 30.0–36.0)
MCV: 78 fl (ref 78.0–100.0)
MONO ABS: 0.5 10*3/uL (ref 0.1–1.0)
Monocytes Relative: 6.3 % (ref 3.0–12.0)
NEUTROS PCT: 75.8 % (ref 43.0–77.0)
Neutro Abs: 6 10*3/uL (ref 1.4–7.7)
PLATELETS: 388 10*3/uL (ref 150.0–400.0)
RBC: 4.41 Mil/uL (ref 3.87–5.11)
RDW: 26.6 % — ABNORMAL HIGH (ref 11.5–15.5)
WBC: 8 10*3/uL (ref 4.0–10.5)

## 2017-10-24 LAB — BASIC METABOLIC PANEL
BUN: 8 mg/dL (ref 6–23)
CALCIUM: 9.6 mg/dL (ref 8.4–10.5)
CO2: 23 mEq/L (ref 19–32)
CREATININE: 0.79 mg/dL (ref 0.40–1.20)
Chloride: 103 mEq/L (ref 96–112)
GFR: 117.01 mL/min (ref >60.00)
GLUCOSE: 100 mg/dL — AB (ref 70–99)
POTASSIUM: 3.6 meq/L (ref 3.5–5.1)
Sodium: 139 mEq/L (ref 135–145)

## 2017-10-24 LAB — TSH: TSH: 6.33 u[IU]/mL — ABNORMAL HIGH (ref 0.35–4.50)

## 2017-10-24 LAB — T4, FREE: Free T4: 1.01 ng/dL (ref 0.60–1.60)

## 2017-10-24 MED ORDER — CLINDAMYCIN PHOSPHATE 1 % EX SOLN: Freq: Two times a day (BID) | CUTANEOUS | 3 refills | Status: DC

## 2017-10-24 MED ORDER — FINASTERIDE 5 MG PO TABS: 10.0000 mg | ORAL_TABLET | Freq: Every day | ORAL | 2 refills | Status: DC

## 2017-10-24 MED ORDER — FERROUS SULFATE 325 (65 FE) MG PO TABS: 325.0000 mg | ORAL_TABLET | Freq: Every day | ORAL | 3 refills | Status: DC

## 2017-10-24 MED ORDER — PROPRANOLOL HCL 20 MG PO TABS: ORAL_TABLET | ORAL | 3 refills | Status: DC

## 2017-10-24 MED ORDER — SILVER SULFADIAZINE 1 % EX CREA: 1.0000 "application " | TOPICAL_CREAM | Freq: Every day | CUTANEOUS | 0 refills | Status: DC

## 2017-10-24 NOTE — Progress Notes (Signed)
GYNECOLOGY OFFICE VISIT NOTE  History:  22 y.o. G0P0000 here today for follow up from PCP for heavy periods. Last fall, PCP started her on iron for anemia and she bled heavy clots for two months straight. Her mom stopped her iron pills and then her heavy bleeding stopped. Prior to this, had monthly periods lasting 5-6 days and was not heavy. After she stopped the iron pills, period went back to normal. She restarted iron at urging of PCP and heavy periods started again.   Much of history obtained from patient's mother as patient is shy (per mom, this is first GYN visit and she is very nervous) and did not speak much but appears to understand conversation and able to answer questions.  Past Medical History:  Diagnosis Date  . Cerebral palsy (HCC)   . Constipation   . GERD (gastroesophageal reflux disease)   . History of encephalopathy   . Hydradenitis    bilateral  . Hydrocephaly   . Migraine   . Neurogenic bladder   . Seizures (HCC)   . Vitamin D deficiency 04/03/2017   Vit d 25oh, 12.7 03/2017 - see [facility] labs  . VP (ventriculoperitoneal) shunt status     Past Surgical History:  Procedure Laterality Date  . BOTOX INJECTION    . DENTAL EXAMINATION UNDER ANESTHESIA    . EYE SURGERY    . HAMSTRING LENGTHENING    . shunt replacement  2008   5th shunt for pt.   . TENDON LENGTHENING    . VENTRICULOPERITONEAL SHUNT       Current Outpatient Medications:  .  baclofen (LIORESAL) 10 MG tablet, Take 10 mg by mouth 2 (two) times daily. 5 mg in the morning and 15 mg at night, Disp: , Rfl:  .  Cholecalciferol (VITAMIN D3 PO), Take 400 Units by mouth daily., Disp: , Rfl:  .  cyclobenzaprine (FLEXERIL) 5 MG tablet, Take 5 mg by mouth every 8 (eight) hours. For 7 days., Disp: , Rfl: 1 .  Fluocinolone Acetonide Body 0.01 % OIL, APPLY TO THE SKIN 2-3 TIMES D UTD, Disp: , Rfl: 5 .  furosemide (LASIX) 20 MG tablet, Take 1 tablet (20 mg total) by mouth daily as needed for edema., Disp: 30  tablet, Rfl: 1 .  ibuprofen (ADVIL,MOTRIN) 800 MG tablet, Take 1 tablet (800 mg total) by mouth every 8 (eight) hours as needed for headache, mild pain or moderate pain., Disp: 30 tablet, Rfl: 2 .  midazolam (VERSED) 10 MG/2ML SOLN injection, Draw up 2 mL (10 mg total) and give 1 ml to each nostril or between cheek and gums PRN seizure lasting > 5 minutes. Call 911 after giving, Disp: , Rfl:  .  Olopatadine HCl 0.2 % SOLN, Place 1 drop into both eyes daily., Disp: 2.5 mL, Rfl: 5 .  ondansetron (ZOFRAN-ODT) 4 MG disintegrating tablet, Take 1 tablet (4 mg total) by mouth every 8 (eight) hours as needed for nausea or vomiting., Disp: 10 tablet, Rfl: 0 .  potassium chloride SA (K-DUR,KLOR-CON) 10 MEQ tablet, Take 1 tablet (10 mEq total) by mouth daily as needed. Take with Lasix, Disp: 30 tablet, Rfl: 1 .  rizatriptan (MAXALT) 10 MG tablet, Take 10 mg by mouth as needed for migraine. May repeat in 2 hours if needed, Disp: , Rfl:  .  sulfamethoxazole-trimethoprim (BACTRIM DS,SEPTRA DS) 800-160 MG tablet, Take 1 tablet by mouth 2 (two) times daily., Disp: 60 tablet, Rfl: 5 .  zonisamide (ZONEGRAN) 100 MG capsule, Take  200 mg by mouth 2 (two) times daily. , Disp: , Rfl:  .  clindamycin (CLEOCIN-T) 1 % external solution, Apply topically 2 (two) times daily., Disp: 60 mL, Rfl: 3 .  diazepam (DIASTAT ACUDIAL) 20 MG GEL, Place 15 mg rectally once as needed (seizures)., Disp: , Rfl:  .  ferrous sulfate 325 (65 FE) MG tablet, Take 1 tablet (325 mg total) by mouth daily with breakfast., Disp: 90 tablet, Rfl: 3 .  finasteride (PROSCAR) 5 MG tablet, Take 2 tablets (10 mg total) by mouth daily., Disp: 60 tablet, Rfl: 2 .  propranolol (INDERAL) 20 MG tablet, TAKE 1 TABLET(20 MG) BY MOUTH TWICE DAILY, Disp: 180 tablet, Rfl: 3 .  silver sulfADIAZINE (SILVADENE) 1 % cream, Apply 1 application topically daily., Disp: 50 g, Rfl: 0  The following portions of the patient's history were reviewed and updated as appropriate:  allergies, current medications, past family history, past medical history, past social history, past surgical history and problem list.   Review of Systems:  Pertinent items noted in HPI and remainder of comprehensive ROS otherwise negative.   Objective:  Physical Exam LMP 10/02/2017  CONSTITUTIONAL: Well-developed, well-nourished female in no acute distress. Patient in wheelchair, able to answer questions, cooperative with conversation NEUROLOGIC: Alert and oriented to person, place, and time. Normal reflexes, muscle tone coordination. No cranial nerve deficit noted. PSYCHIATRIC: Normal mood and affect. Difficult to assess judgement but appears to ask questions and understand discussion PELVIC: Deferred MUSCULOSKELETAL: in wheelchair, motion not able to be assessed  Labs and Imaging No results found.  Assessment & Plan:   1. Abnormal uterine bleeding (AUB) - Reviewed options for non-hormonal and hormonal management of AUB. I reviewed that I recommend she take iron pills as she was anemic and that the iron pills are not causing her increased bleeding.  - Patient is in wheelchair and at higher risk for clots, reviewed that I would not recommend CHC due to this reason. Reviewed depo, Mirena, Progesterone only pills, non-hormonal management with advil. Reviewd risks/benefits of each. Patient has never been sexually active and declines IUD at this point. Reviewed depo may work well btu also may cause increased bleeding. Reviewed possible interaction with labetalol which patient is on for her VP shunt, her mother is wary of any medication that may interfere with VP shunt as patient has had multiple surgeries for this issue. After reviewing options with patient and mother, will have patient start ibuprofen scheduled during menses and her mother will discuss hormonal management options with her neurologist. They will return 3 months for further management.   Routine preventative health maintenance  measures emphasized. Please refer to After Visit Summary for other counseling recommendations.   Return in about 3 months (around 01/24/2018) for OB visit (MD).    K. Therese Sarah, M.D. Attending Obstetrician & Gynecologist, [facility] for [employer], [facility]

## 2017-10-24 NOTE — Patient Instructions (Signed)
Take 800 mg Motrin (ibuprofen) once per day for 5 days starting on the day your period starts. Do not take longer than 3 days at a time. Drink plenty of water.  Ask the neurologist about  Norethindrone 5 mg daily Depo-Provera (medroxyprogesterone acetate 150 mg IM) every 3 months Mirena IUD (Levonorgestrel 52/5 intrauterine device)

## 2017-10-24 NOTE — Progress Notes (Signed)
Subjective  CC:  Chief Complaint  Patient presents with  . Anemia    Had visit with GYN today for iron deficiency anemia and irregular menses  . Edema    Improved, taking Lasix every other day with potassium    HPI: Jody [name] is a 22 y.o. female who presents to the office today to address the problems listed above in the chief complaint.  Iron deficiency anemia: Reviewed GYN notes today.  Menometrorrhagia.  Hopeful to control with hormones.  Patient's last menstrual cycle lasted just over 2 weeks.  Continues to have big clots.  Taking over-the-counter low-dose iron therapy twice a day without side effects or constipation.  Feels fine.  Lower extremity edema, dependent.  Improved with every other day Lasix.  Patient has not been able to see her dermatologist due to transportation issues.  She is in [place].  She needs refills on several medications for her chronic hidradenitis suppurativa. Assessment  1. Iron deficiency anemia due to chronic blood loss   2. Lower extremity edema   3. Irregular menstrual bleeding   4. Hidradenitis suppurativa     She Plan   Iron deficiency anemia: Very low ferritin and iron levels.  Recheck today after low-dose oral iron supplementation.  Hoping anemia is not worse.  May need IV iron will recommend follow-up pending results  Lower extremity edema is much improved.  Continue as needed Lasix.  Check potassium and renal function today  Patient's mother will follow-up with neurologist to see if she can tolerate combined oral contraceptive pills or if she needs progesterone only medications.  Reinforced need to control her menstrual cycles to limit further anemia.  Refill medications.  Recommend finding dermatologist in [place] area.  Dr.  Follow up: Return in about 5 months (around 03/26/2018) for complete physical.   Orders Placed This Encounter  Procedures  . Iron, TIBC and Ferritin Panel  . CBC with Differential/Platelet  . TSH  .  T4, free  . T3  . Basic metabolic panel   Meds ordered this encounter  Medications  . ferrous sulfate 325 (65 FE) MG tablet    Sig: Take 1 tablet (325 mg total) by mouth daily with breakfast.    Dispense:  90 tablet    Refill:  3  . finasteride (PROSCAR) 5 MG tablet    Sig: Take 2 tablets (10 mg total) by mouth daily.    Dispense:  60 tablet    Refill:  2  . clindamycin (CLEOCIN-T) 1 % external solution    Sig: Apply topically 2 (two) times daily.    Dispense:  60 mL    Refill:  3  . silver sulfADIAZINE (SILVADENE) 1 % cream    Sig: Apply 1 application topically daily.    Dispense:  50 g    Refill:  0  . propranolol (INDERAL) 20 MG tablet    Sig: TAKE 1 TABLET(20 MG) BY MOUTH TWICE DAILY    Dispense:  180 tablet    Refill:  3      I reviewed the patients updated PMH, FH, and SocHx.    Patient Active Problem List   Diagnosis Date Noted  . VP (ventriculoperitoneal) shunt status 03/28/2017    Priority: High  . Cerebral palsy, diplegic (HCC) 02/06/2013    Priority: High  . Localization-related symptomatic epilepsy and epileptic syndromes with complex partial seizures, not intractable, without status epilepticus (HCC) 02/06/2013    Priority: High  . Congenital hydrocephalus (HCC) 05/26/2008  Priority: High  . Hidradenitis suppurativa 08/07/2014    Priority: Medium  . Migraine 02/06/2013    Priority: Medium  . Neurogenic bladder 10/17/2002    Priority: Medium  . Constipated 03/27/2015    Priority: Low  . Exotropia 05/21/2010    Priority: Low  . Microcytic anemia 04/03/2017  . Vitamin D deficiency 04/03/2017   Current Meds  Medication Sig  . baclofen (LIORESAL) 10 MG tablet Take 10 mg by mouth 2 (two) times daily. 5 mg in the morning and 15 mg at night  . Cholecalciferol (VITAMIN D3 PO) Take 400 Units by mouth daily.  . cyclobenzaprine (FLEXERIL) 5 MG tablet Take 5 mg by mouth every 8 (eight) hours. For 7 days.  . finasteride (PROSCAR) 5 MG tablet Take 2 tablets  (10 mg total) by mouth daily.  . Fluocinolone Acetonide Body 0.01 % OIL APPLY TO THE SKIN 2-3 TIMES D UTD  . furosemide (LASIX) 20 MG tablet Take 1 tablet (20 mg total) by mouth daily as needed for edema.  Marland Kitchen ibuprofen (ADVIL,MOTRIN) 800 MG tablet Take 1 tablet (800 mg total) by mouth every 8 (eight) hours as needed for headache, mild pain or moderate pain.  . midazolam (VERSED) 10 MG/2ML SOLN injection Draw up 2 mL (10 mg total) and give 1 ml to each nostril or between cheek and gums PRN seizure lasting > 5 minutes. Call 911 after giving  . Olopatadine HCl 0.2 % SOLN Place 1 drop into both eyes daily.  . ondansetron (ZOFRAN-ODT) 4 MG disintegrating tablet Take 1 tablet (4 mg total) by mouth every 8 (eight) hours as needed for nausea or vomiting.  . potassium chloride SA (K-DUR,KLOR-CON) 10 MEQ tablet Take 1 tablet (10 mEq total) by mouth daily as needed. Take with Lasix  . propranolol (INDERAL) 20 MG tablet TAKE 1 TABLET(20 MG) BY MOUTH TWICE DAILY  . rizatriptan (MAXALT) 10 MG tablet Take 10 mg by mouth as needed for migraine. May repeat in 2 hours if needed  . sulfamethoxazole-trimethoprim (BACTRIM DS,SEPTRA DS) 800-160 MG tablet Take 1 tablet by mouth 2 (two) times daily.  Marland Kitchen zonisamide (ZONEGRAN) 100 MG capsule Take 200 mg by mouth 2 (two) times daily.   . [DISCONTINUED] finasteride (PROSCAR) 5 MG tablet Take 1 tablet (5 mg total) by mouth daily.  . [DISCONTINUED] IRON PO Take 1 tablet by mouth 2 (two) times daily.  . [DISCONTINUED] propranolol (INDERAL) 20 MG tablet TAKE 1 TABLET(20 MG) BY MOUTH TWICE DAILY    Allergies: Patient is allergic to ceftibuten; fentanyl; tape; and doxycycline. Family History: Patient family history includes Diabetes in her brother, maternal grandfather, and mother; Gastric cancer in her maternal grandfather; Hyperlipidemia in her mother; Hypertension in her mother; Obesity in her brother and mother. Social History:  Patient  reports that she has never smoked. She  has never used smokeless tobacco. She reports that she does not drink alcohol or use drugs.  Review of Systems: Constitutional: Negative for fever malaise or anorexia Cardiovascular: negative for chest pain Respiratory: negative for SOB or persistent cough Gastrointestinal: negative for abdominal pain  Objective  Vitals: BP 110/78   Pulse (!) 102   Temp (!) 97.5 F (36.4 C) (Oral)   Resp 16   LMP 10/02/2017   SpO2 99%  General: no acute distress , A&Ox3 HEENT: PEERL, conjunctiva normal, Oropharynx moist,neck is supple Cardiovascular:  RRR without murmur or gallop.  Respiratory:  Good breath sounds bilaterally, CTAB with normal respiratory effort Skin:  Warm, right axilla  pigmented area of scarring. Bilateral lower extremities with trace pitting edema.  Lab Results  Component Value Date   WBC 8.5 09/01/2017   HGB 8.9 (L) 09/01/2017   HCT 29.8 (L) 09/01/2017   MCV 67.9 (L) 09/01/2017   PLT 419 (H) 09/01/2017   Lab Results  Component Value Date   IRON 14 (L) 09/01/2017   TIBC 439 09/01/2017   FERRITIN 3 (L) 09/01/2017      Commons side effects, risks, benefits, and alternatives for medications and treatment plan prescribed today were discussed, and the patient expressed understanding of the given instructions. Patient is instructed to call or message via MyChart if he/she has any questions or concerns regarding our treatment plan. No barriers to understanding were identified. We discussed Red Flag symptoms and signs in detail. Patient expressed understanding regarding what to do in case of urgent or emergency type symptoms.   Medication list was reconciled, printed and provided to the patient in AVS. Patient instructions and summary information was reviewed with the patient as documented in the AVS. This note was prepared with assistance of Dragon voice recognition software. Occasional wrong-word or sound-a-like substitutions may have occurred due to the inherent limitations  of voice recognition software

## 2017-10-24 NOTE — Patient Instructions (Signed)
Please return in November for your annual complete physical; please come fasting.  I will release your lab results to you on your MyChart account with further instructions. Please reply with any questions.    If you have any questions or concerns, please don't hesitate to send me a message via MyChart or call the office at [phone]. Thank you for visiting with Jody [name] today! It's our pleasure caring for you.  Start taking the higher dose of iron supplements 3x/day.   Ask your neurologist about taking birth control pills with estrogen.

## 2017-10-24 NOTE — Progress Notes (Signed)
Pt c/o menorrhagia and dysmenorrhea with "long thick blood clots." Mother feels iron is causing the heavy periods.

## 2017-10-25 LAB — T3: T3, Total: 165 ng/dL (ref 76–181)

## 2017-10-25 LAB — IRON,TIBC AND FERRITIN PANEL
%SAT: 70 % — AB (ref 16–45)
FERRITIN: 15 ng/mL — AB (ref 16–154)
IRON: 305 ug/dL — AB (ref 40–190)
TIBC: 434 ug/dL (ref 250–450)

## 2017-10-25 LAB — PATHOLOGIST SMEAR REVIEW

## 2017-10-26 ENCOUNTER — Encounter: Payer: Self-pay | Admitting: Obstetrics and Gynecology

## 2017-11-24 ENCOUNTER — Other Ambulatory Visit: Payer: Self-pay | Admitting: Family Medicine

## 2017-11-24 NOTE — Telephone Encounter (Signed)
Okay for Refill?  Last OV: 10/25/2017

## 2017-12-30 ENCOUNTER — Other Ambulatory Visit: Payer: Self-pay | Admitting: Family Medicine

## 2018-02-05 ENCOUNTER — Other Ambulatory Visit: Payer: Self-pay | Admitting: Family Medicine

## 2018-02-08 ENCOUNTER — Other Ambulatory Visit: Payer: Self-pay | Admitting: Family Medicine

## 2018-03-20 ENCOUNTER — Telehealth: Payer: Self-pay | Admitting: Family Medicine

## 2018-03-20 NOTE — Telephone Encounter (Signed)
Forms placed on Dr. Modesta MessingAndy's Desk

## 2018-03-20 NOTE — Telephone Encounter (Signed)
Please fax form to:  Lisette AbuCarrie Archer:   fx: [phone] ph [phone]  Pt's mother is requesting that this be faxed as soon as possible.  She is trying to get a modified van for pt before Thanksgiving.   Please call pt's mother when faxed. Pt's mother: [phone]

## 2018-03-20 NOTE — Telephone Encounter (Signed)
I have placed a Cert. Of medical Necessity in the bin up front for completion with a charge sheet.

## 2018-03-20 NOTE — Telephone Encounter (Signed)
Completed and returned to you 

## 2018-03-21 NOTE — Telephone Encounter (Signed)
Forms faxed to Lisette Abuar[employer] Archer, call mother and LM informing her that forms have been sent.   Kathi SimpersAmy ,  LPN

## 2018-03-28 ENCOUNTER — Other Ambulatory Visit: Payer: Self-pay | Admitting: Family Medicine

## 2018-04-02 ENCOUNTER — Other Ambulatory Visit: Payer: Self-pay | Admitting: Family Medicine

## 2018-04-11 ENCOUNTER — Other Ambulatory Visit: Payer: Self-pay

## 2018-04-11 ENCOUNTER — Emergency Department (HOSPITAL_COMMUNITY): Payer: Medicare HMO

## 2018-04-11 ENCOUNTER — Emergency Department (HOSPITAL_COMMUNITY)
Admission: EM | Admit: 2018-04-11 | Discharge: 2018-04-12 | Disposition: A | Discharge status: Home / Self Care | Payer: Medicare HMO | Attending: Emergency Medicine | Admitting: Emergency Medicine

## 2018-04-11 ENCOUNTER — Encounter (HOSPITAL_COMMUNITY): Payer: Self-pay | Admitting: Emergency Medicine

## 2018-04-11 DIAGNOSIS — Z79899 Other long term (current) drug therapy: Secondary | ICD-10-CM | POA: Diagnosis not present

## 2018-04-11 DIAGNOSIS — N939 Abnormal uterine and vaginal bleeding, unspecified: Secondary | ICD-10-CM

## 2018-04-11 DIAGNOSIS — R102 Pelvic and perineal pain: Secondary | ICD-10-CM | POA: Insufficient documentation

## 2018-04-11 DIAGNOSIS — D509 Iron deficiency anemia, unspecified: Secondary | ICD-10-CM | POA: Diagnosis not present

## 2018-04-11 LAB — I-STAT BETA HCG BLOOD, ED (MC, WL, AP ONLY): I-stat hCG, quantitative: <5 m[IU]/mL (ref <5)

## 2018-04-11 MED ORDER — MEGESTROL ACETATE 40 MG PO TABS
40.0000 mg | ORAL_TABLET | Freq: Once | ORAL | Status: AC
  Administered 2018-04-12: 40 mg via ORAL
  Filled 2018-04-11: qty 1

## 2018-04-11 NOTE — ED Triage Notes (Addendum)
Patient c/o vaginal bleeding x1 month. Reports passing large clot with abdominal cramping today. Hx CP.

## 2018-04-11 NOTE — ED Notes (Signed)
Blood draw attempt unsuccessful. RN notified. 

## 2018-04-11 NOTE — ED Notes (Signed)
Patient states that she is not currently experiencing abdominal pain; she took ibuprofen around 1500 today and has not had pain since. Family member states that the patient has been passing clots today and the biggest clot was the size of a baseball.

## 2018-04-11 NOTE — ED Notes (Signed)
Attempted lab draw but unsuccessful. RN,Briella made aware.

## 2018-04-11 NOTE — ED Provider Notes (Signed)
[facility]-EMERGENCY DEPT Provider Note   CSN: [id number] Arrival date & time: 04/11/18  1756     History   Chief Complaint Chief Complaint  Patient presents with  . Vaginal Bleeding    HPI Jody [name] is a 22 y.o. female.  HPI   Patient is a 22 year old female with a history of cerebral palsy, VP shunt, neurogenic bladder, abnormal uterine bleeding, and iron deficiency anemia presenting for vaginal bleeding.  Patient presents with her mother who assist in history taking.  Per patient's mother, patient has had vaginal bleeding for 30 days.  She reports that this started with a normal menstrual cycle for the patient, but continued.  Over the last couple days she has had increased clots pass into the patient's briefs.  Today, she received a call from patient's caregiver stating that she passed a clot vaginally "the size of a baseball".  Patient is previously had a consultation with OB/GYN for her heavy uterine bleeding, and the resolution was to give the patient ibuprofen at the start of her menses.  Today, patient has lower abdominal cramping that resolved on arrival to the emergency department.  Patient's mother reports that she typically does not get vaginal bleeding with cramping.  Patient not a candidate for estrogen therapies for her abnormal uterine bleeding due to her immobility and DVT risk.  Patient was previously on iron, however patient's mother felt there to be an association between giving iron and the increase in heavy menses, and the heavy menses seemed to improve when stopping the iron.  Patient questions outside of her mother's presence, and states that she has never had sexual activity.  Past Medical History:  Diagnosis Date  . Cerebral palsy (HCC)   . Constipation   . GERD (gastroesophageal reflux disease)   . History of encephalopathy   . Hydradenitis    bilateral  . Hydrocephaly (HCC)   . Migraine   . Neurogenic bladder   . Seizures (HCC)    . Vitamin D deficiency 04/03/2017   Vit d 25oh, 12.7 03/2017 - see [facility] labs  . VP (ventriculoperitoneal) shunt status     Patient Active Problem List   Diagnosis Date Noted  . Microcytic anemia 04/03/2017  . Vitamin D deficiency 04/03/2017  . VP (ventriculoperitoneal) shunt status 03/28/2017  . Constipated 03/27/2015  . Hidradenitis suppurativa 08/07/2014  . Cerebral palsy, diplegic (HCC) 02/06/2013  . Localization-related symptomatic epilepsy and epileptic syndromes with complex partial seizures, not intractable, without status epilepticus (HCC) 02/06/2013  . Migraine 02/06/2013  . Exotropia 05/21/2010  . Congenital hydrocephalus (HCC) 05/26/2008  . Neurogenic bladder 10/17/2002    Past Surgical History:  Procedure Laterality Date  . BOTOX INJECTION    . DENTAL EXAMINATION UNDER ANESTHESIA    . EYE SURGERY    . HAMSTRING LENGTHENING    . shunt replacement  2008   5th shunt for pt.   . TENDON LENGTHENING    . VENTRICULOPERITONEAL SHUNT       OB History    Gravida  0   Para  0   Term  0   Preterm  0   AB  0   Living  0     SAB  0   TAB  0   Ectopic  0   Multiple  0   Live Births  0            Home Medications    Prior to Admission medications   Medication Sig Start  Date End Date Taking? Authorizing Provider  baclofen (LIORESAL) 10 MG tablet Take 10 mg by mouth 2 (two) times daily. 5 mg in the morning and 15 mg at night    [provider]  Cholecalciferol (VITAMIN D3 PO) Take 400 Units by mouth daily.    [provider]  clindamycin (CLEOCIN T) 1 % external solution APPLY TO AFFECTED AREA TWICE DAILY 04/02/18   Willow OraAndy, Camille L, MD  cyclobenzaprine (FLEXERIL) 5 MG tablet TAKE 1 TABLET(5 MG) BY MOUTH THREE TIMES DAILY AS NEEDED FOR MUSCLE SPASMS 11/24/17   Willow OraAndy, Camille L, MD  diazepam (DIASTAT ACUDIAL) 20 MG GEL Place 15 mg rectally once as needed (seizures).    [provider]  ferrous sulfate 325 (65 FE) MG tablet Take 1  tablet (325 mg total) by mouth daily with breakfast. 10/24/17   Willow OraAndy, Camille L, MD  finasteride (PROSCAR) 5 MG tablet Take 2 tablets (10 mg total) by mouth daily. 10/24/17   Willow OraAndy, Camille L, MD  Fluocinolone Acetonide Body 0.01 % OIL APPLY TO THE SKIN 2-3 TIMES D UTD 08/15/17   [provider]  furosemide (LASIX) 20 MG tablet TAKE 1 TABLET BY MOUTH DAILY AS NEEDED FOR EDEMA 04/02/18   Willow OraAndy, Camille L, MD  ibuprofen (ADVIL,MOTRIN) 800 MG tablet TAKE 1 TABLET BY MOUTH EVERY 8 HOURS AS NEEDED FOR HEADACHE OR MILD PAIN 04/02/18   Willow OraAndy, Camille L, MD  midazolam (VERSED) 10 MG/2ML SOLN injection Draw up 2 mL (10 mg total) and give 1 ml to each nostril or between cheek and gums PRN seizure lasting > 5 minutes. Call 911 after giving 04/04/17   [provider]  Olopatadine HCl 0.2 % SOLN INSTILL 1 DROP IN BOTH EYES DAILY 11/24/17   Willow OraAndy, Camille L, MD  ondansetron (ZOFRAN-ODT) 4 MG disintegrating tablet Take 1 tablet (4 mg total) by mouth every 8 (eight) hours as needed for nausea or vomiting. 12/03/14   Benjiman CorePickering, Nathan, MD  potassium chloride (K-DUR,KLOR-CON) 10 MEQ tablet TAKE 1 TABLET BY MOUTH DAILY AS NEEDED TAKE WITH LASIX 04/02/18   Willow OraAndy, Camille L, MD  propranolol (INDERAL) 20 MG tablet TAKE 1 TABLET(20 MG) BY MOUTH TWICE DAILY 10/24/17   Willow OraAndy, Camille L, MD  rizatriptan (MAXALT) 10 MG tablet Take 10 mg by mouth as needed for migraine. May repeat in 2 hours if needed    [provider]  silver sulfADIAZINE (SILVADENE) 1 % cream Apply 1 application topically daily. 10/24/17   Willow OraAndy, Camille L, MD  sulfamethoxazole-trimethoprim (BACTRIM DS,SEPTRA DS) 800-160 MG tablet TAKE 1 TABLET BY MOUTH TWICE DAILY 11/24/17   Willow OraAndy, Camille L, MD  zonisamide (ZONEGRAN) 100 MG capsule TAKE 3 CAPSULES(300 MG) BY MOUTH DAILY 04/02/18   Willow OraAndy, Camille L, MD    Family History Family History  Problem Relation Age of Onset  . Diabetes Mother   . Hyperlipidemia Mother   . Hypertension Mother   . Obesity  Mother   . Diabetes Brother   . Obesity Brother   . Gastric cancer Maternal Grandfather   . Diabetes Maternal Grandfather     Social History Social History   Tobacco Use  . Smoking status: Never Smoker  . Smokeless tobacco: Never Used  Substance Use Topics  . Alcohol use: No  . Drug use: No     Allergies   Ceftibuten; Fentanyl; Tape; and Doxycycline   Review of Systems Review of Systems  Constitutional: Negative for chills and fever.  Respiratory: Negative for cough, chest tightness  and shortness of breath.   Cardiovascular: Negative for chest pain, palpitations and leg swelling.  Gastrointestinal: Positive for diarrhea. Negative for abdominal pain, nausea and vomiting.       1 episode soft stool today.   Genitourinary: Positive for pelvic pain and vaginal bleeding. Negative for dysuria, flank pain and urgency.  Musculoskeletal: Negative for back pain and myalgias.  Skin: Negative for rash.  Neurological: Negative for dizziness, syncope, light-headedness and headaches.  All other systems reviewed and are negative.    Physical Exam Updated Vital Signs BP (!) 141/84 (BP Location: Left Arm)   Pulse 98   Temp 98.3 F (36.8 C) (Oral)   Resp 20   LMP 04/11/2018   SpO2 98%   Physical Exam  Constitutional: She appears well-developed and well-nourished. No distress.  HENT:  Head: Normocephalic and atraumatic.  Mouth/Throat: Oropharynx is clear and moist.  Eyes: Pupils are equal, round, and reactive to light. Conjunctivae and EOM are normal.  Neck: Normal range of motion. Neck supple.  Cardiovascular: Normal rate, regular rhythm, S1 normal and S2 normal.  No murmur heard. Pulmonary/Chest: Effort normal and breath sounds normal. She has no wheezes. She has no rales.  Abdominal: Soft. She exhibits no distension. There is no tenderness. There is no guarding.  Genitourinary:  Genitourinary Comments: External examination performed with RN chaperone present.  There is thin  blood present within the vaginal vault.  No other lesions noted.  No lesions of rectum.  Patient unable to tolerate exam with pediatric speculum.   Musculoskeletal: Normal range of motion. She exhibits no edema or deformity.  Neurological: She is alert.  Cranial nerves grossly intact. Patient moves extremities symmetrically and with good coordination.  Skin: Skin is warm and dry. No rash noted. No erythema.  Psychiatric: She has a normal mood and affect. Her behavior is normal. Judgment and thought content normal.  Nursing note and vitals reviewed.    ED Treatments / Results  Labs (all labs ordered are listed, but only abnormal results are displayed) Labs Reviewed  TSH  CBC WITH DIFFERENTIAL/PLATELET  BASIC METABOLIC PANEL  URINALYSIS, ROUTINE W REFLEX MICROSCOPIC  I-STAT BETA HCG BLOOD, ED ([facility], [facility], [facility] ONLY)    EKG None  Radiology No results found.  Procedures Procedures (including critical care time)  Medications Ordered in ED Medications - No data to display   Initial Impression / Assessment and Plan / ED Course  I have reviewed the triage vital signs and the nursing notes.  Pertinent labs & imaging results that were available during my care of the patient were reviewed by me and considered in my medical decision making (see chart for details).  Clinical Course as of Apr 12 328  Wed Apr 11, 2018  2322 Spoke with Dr. Jolayne Panther of OB/GYN who recommends Megace to decrease DVT risk.   [AM]  2341 Spoke with pharmacist Geoffery Spruce who states that Megace should not interact with patient's medications. States that patient should keep an eye on her blood pressure since it could increase Bp.    [AM]  Thu Apr 12, 2018  0026 Has had previous elevated TSH with normal T3 and free T4.   TSH(!): 6.978 [AM]  0027 Appears to be progressively decreasing.   Hemoglobin(!): 7.7 [AM]    Clinical Course User Index [AM] Elisha Ponder, PA-C    Patient is a 22 yo female with prolonged  abnormal uterine bleeding. Patient is well-appearing, hemodynamically stable, and in no acute distress. Patient initially had tachycardic  reading to 102 but this resolved during time of examination.   Pelvic examination limited due to patient tolerance and no previous history of pelvic examinations. External exam unremarkable. Pelvic ultrasound limited due to inability to perform transvaginal but without abnormality.   Laboratory analysis demonstrating hemoglobin of 7.7, approximately 3 points lower than last lab work 6 month ago. No overt symptomatic anemia. Case discussed again with Dr. Jolayne Panther via Glenard Haring, PA-C during sign out out who recommends continuing with Megace. Estrogen therapy avoided due to DVT risk. TSH elevated today but has been elevated in past with normal T3 and free T4. Will defer to outpatient workup.  Patient and her mother given return precautions for any sudden increase in bleeding despite Megace, dizziness, lightheadedness, palpitations. Patient to follow up with PCP and OBGYN. Nursing notes reviewed. Vital signs reviewed. All questions answered by patient and family.  Final Clinical Impressions(s) / ED Diagnoses   Final diagnoses:  Abnormal uterine bleeding (AUB)  Iron deficiency anemia, unspecified iron deficiency anemia type    ED Discharge Orders    None       Delia Chimes 04/12/18 0358    Shaune Pollack, MD 04/12/18 [id number]

## 2018-04-11 NOTE — ED Provider Notes (Signed)
22 year old female received at sign out from [facility] pending labs, and pelvic US. Per her HPI:   "Patient is a 22 year old female with a history of cerebral palsy, VP shunt, neurogenic bladder, abnormal uterine bleeding, and iron deficiency anemia presenting for vaginal bleeding.  Patient presents with her mother who assist in history taking.  Per patient's mother, patient has had vaginal bleeding for 30 days.  She reports that this started with a normal menstrual cycle for the patient, but continued.  Over the last couple days she has had increased clots pass into the patient's briefs.  Today, she received a call from patient's caregiver stating that she passed a clot vaginally "the size of a baseball".  Patient is previously had a consultation with OB/GYN for her heavy uterine bleeding, and the resolution was to give the patient ibuprofen at the start of her menses.  Today, patient has lower abdominal cramping that resolved on arrival to the emergency department.  Patient's mother reports that she typically does not get vaginal bleeding with cramping.  Patient not a candidate for estrogen therapies for her abnormal uterine bleeding due to her inability and DVT risk.  Patient was previously on iron, however patient's mother made the association between given iron and the increase in heavy menses, and the seem to resolve when stopping the iron.  Patient questions outside of her mother's presence, and states that she has never had sexual activity."  Physical Exam  BP 129/88 (BP Location: Left Arm)   Pulse 93   Temp 98.3 F (36.8 C) (Oral)   Resp 17   LMP 04/11/2018   SpO2 100%   Physical Exam  ED Course/Procedures   Clinical Course as of Apr 12 754  Wed Apr 11, 2018  2322 Spoke with Dr. Jolayne Panther of OB/GYN who recommends Megace to decrease DVT risk.   [AM]  2341 Spoke with pharmacist Geoffery Spruce who states that Megace should not interact with patient's medications. States that patient should keep an  eye on her blood pressure since it could increase Bp.    [AM]  Thu Apr 12, 2018  0026 Has had previous elevated TSH with normal T3 and free T4.   TSH(!): 6.978 [AM]  0027 Appears to be progressively decreasing.   Hemoglobin(!): 7.7 [AM]    Clinical Course User Index [AM] Elisha Ponder, PA-C    Procedures  MDM   22 year old female with a history of cerebral palsy, VP shunt, neurogenic bladder, abnormal uterine bleeding, and iron deficiency anemia received a signout from [facility] pending labs, pelvic ultrasound, and pharmacy consult.  PA Dayton Scrape previously spoke with OB/GYN who recommended a 30-day course of Megace for the patient's vaginal bleeding.  Please see her note for further details regarding medical decision making.  TSH is 6.978.  Advise follow-up with primary care OB/GYN for further evaluation.  Hemoglobin is 7.7.  After CBC resulted, consulted Dr. Jolayne Panther with OBGYN who did not feel that the patient warranted transfusion as she was asymptomatic.  We will discharge the patient home with Megace as described above as well as ferrous sulfate tablets.  Pelvic ultrasound with poor visualization of the pelvic structures, but normal appearance of the visible uterus.  She and her mother are agreeable with the plan at this time.  She was also given strict return precautions to the emergency department for new or worsening symptoms.  She is hemodynamically stable and in no acute distress.  She is safe for discharge home with outpatient follow-up at  this time.   Barkley BoardsMcDonald, Mia A, PA-C 04/12/18 0755    Paula LibraMolpus, John, MD 04/12/18 2251

## 2018-04-12 DIAGNOSIS — N939 Abnormal uterine and vaginal bleeding, unspecified: Secondary | ICD-10-CM | POA: Diagnosis not present

## 2018-04-12 LAB — CBC WITH DIFFERENTIAL/PLATELET
Abs Immature Granulocytes: 0.04 10*3/uL (ref 0.00–0.07)
Basophils Absolute: 0 10*3/uL (ref 0.0–0.1)
Basophils Relative: 0 %
EOS ABS: 0.1 10*3/uL (ref 0.0–0.5)
Eosinophils Relative: 1 %
HCT: 28.3 % — ABNORMAL LOW (ref 36.0–46.0)
Hemoglobin: 7.7 g/dL — ABNORMAL LOW (ref 12.0–15.0)
Immature Granulocytes: 0 %
Lymphocytes Relative: 19 %
Lymphs Abs: 1.8 10*3/uL (ref 0.7–4.0)
MCH: 19.3 pg — AB (ref 26.0–34.0)
MCHC: 27.2 g/dL — AB (ref 30.0–36.0)
MCV: 70.9 fL — ABNORMAL LOW (ref 80.0–100.0)
Monocytes Absolute: 0.6 10*3/uL (ref 0.1–1.0)
Monocytes Relative: 6 %
Neutro Abs: 7 10*3/uL (ref 1.7–7.7)
Neutrophils Relative %: 74 %
Platelets: 347 10*3/uL (ref 150–400)
RBC: 3.99 MIL/uL (ref 3.87–5.11)
RDW: 22.3 % — ABNORMAL HIGH (ref 11.5–15.5)
WBC: 9.5 10*3/uL (ref 4.0–10.5)
nRBC: 0.3 % — ABNORMAL HIGH (ref 0.0–0.2)

## 2018-04-12 LAB — BASIC METABOLIC PANEL
ANION GAP: 12 (ref 5–15)
BUN: 12 mg/dL (ref 6–20)
CHLORIDE: 108 mmol/L (ref 98–111)
CO2: 19 mmol/L — ABNORMAL LOW (ref 22–32)
Calcium: 9 mg/dL (ref 8.9–10.3)
Creatinine, Ser: 0.65 mg/dL (ref 0.44–1.00)
GFR calc Af Amer: >60 mL/min (ref >60)
GFR calc non Af Amer: >60 mL/min (ref >60)
Glucose, Bld: 90 mg/dL (ref 70–99)
Potassium: 3.9 mmol/L (ref 3.5–5.1)
Sodium: 139 mmol/L (ref 135–145)

## 2018-04-12 LAB — T4, FREE: Free T4: 0.97 ng/dL (ref 0.82–1.77)

## 2018-04-12 LAB — TSH: TSH: 6.978 u[IU]/mL — ABNORMAL HIGH (ref 0.350–4.500)

## 2018-04-12 MED ORDER — FERROUS SULFATE 325 (65 FE) MG PO TABS: ORAL_TABLET | ORAL | 3 refills | Status: DC

## 2018-04-12 MED ORDER — MEGESTROL ACETATE 40 MG PO TABS: 40.0000 mg | ORAL_TABLET | Freq: Every day | ORAL | 0 refills | Status: AC

## 2018-04-12 MED ORDER — SODIUM CHLORIDE 0.9 % IV BOLUS: 1000.0000 mL | Freq: Once | INTRAVENOUS | Status: DC

## 2018-04-12 NOTE — ED Notes (Signed)
Unable to obtain signature as patient discharged during downtime.

## 2018-04-12 NOTE — Discharge Instructions (Addendum)
Please see the information and instructions below regarding your visit.  Your diagnoses today include:  1. Abnormal uterine bleeding (AUB)   2. Iron deficiency anemia, unspecified iron deficiency anemia type     Tests performed today include: See side panel of your discharge paperwork for testing performed today. Vital signs are listed at the bottom of these instructions.   Your bleeding should stop with Megace.   Medications prescribed:    Take any prescribed medications only as prescribed, and any over the counter medications only as directed on the packaging.  Please take Megace, 40 mg twice a day for the next two weeks. Measure your blood pressure twice daily while taking this medication. It takes about 2 days to work.   Home care instructions:  Please follow any educational materials contained in this packet.   Follow-up instructions: Please follow-up with your primary care provider in 5 days for recheck of hemoglobin.   Please follow up with OBGYN as soon as possible.   Return instructions:  Please return to the Emergency Department if you experience worsening symptoms.  Please return to the emergency department if you develop any dizziness or lightheadedness, suddenly worsening bleeding, or worsening pelvic pain.  Please return if you have any other emergent concerns.  Additional Information:   Your vital signs today were: BP 139/87 (BP Location: Left Arm)    Pulse (!) 104    Temp 98.3 F (36.8 C) (Oral)    Resp 19    LMP 04/11/2018    SpO2 100%  If your blood pressure (BP) was elevated on multiple readings during this visit above 130 for the top number or above 80 for the bottom number, please have this repeated by your primary care provider within one month. --------------  Thank you for allowing us to participate in your care today.

## 2018-04-13 LAB — T3: T3, Total: 138 ng/dL (ref 71–180)

## 2018-05-01 ENCOUNTER — Other Ambulatory Visit: Payer: Self-pay | Admitting: Family Medicine

## 2018-05-09 ENCOUNTER — Other Ambulatory Visit: Payer: Self-pay | Admitting: Family Medicine

## 2018-05-09 NOTE — Telephone Encounter (Signed)
Needs OV prior to more refills.  Needs OV for cpe.

## 2018-05-09 NOTE — Telephone Encounter (Signed)
CPE has been scheduled 06/01/18

## 2018-05-09 NOTE — Telephone Encounter (Signed)
Last refill:04/02/18 #30, 0  Last OV: 10/24/17  No CPE scheduled

## 2018-06-01 ENCOUNTER — Ambulatory Visit (INDEPENDENT_AMBULATORY_CARE_PROVIDER_SITE_OTHER): Payer: Medicare HMO | Admitting: Family Medicine

## 2018-06-01 ENCOUNTER — Encounter: Payer: Self-pay | Admitting: Family Medicine

## 2018-06-01 ENCOUNTER — Other Ambulatory Visit: Payer: Self-pay

## 2018-06-01 VITALS — BP 118/76 | HR 97 | Temp 98.3°F | Resp 16 | Wt 170.0 lb

## 2018-06-01 DIAGNOSIS — G808 Other cerebral palsy: Secondary | ICD-10-CM

## 2018-06-01 DIAGNOSIS — Z0000 Encounter for general adult medical examination without abnormal findings: Secondary | ICD-10-CM | POA: Diagnosis not present

## 2018-06-01 DIAGNOSIS — Z23 Encounter for immunization: Secondary | ICD-10-CM | POA: Diagnosis not present

## 2018-06-01 DIAGNOSIS — D500 Iron deficiency anemia secondary to blood loss (chronic): Secondary | ICD-10-CM | POA: Diagnosis not present

## 2018-06-01 DIAGNOSIS — Z982 Presence of cerebrospinal fluid drainage device: Secondary | ICD-10-CM

## 2018-06-01 DIAGNOSIS — N926 Irregular menstruation, unspecified: Secondary | ICD-10-CM

## 2018-06-01 MED ORDER — PROPRANOLOL HCL 20 MG PO TABS: 20.0000 mg | ORAL_TABLET | Freq: Two times a day (BID) | ORAL | 3 refills | Status: DC

## 2018-06-01 NOTE — Progress Notes (Signed)
Subjective  Chief Complaint  Patient presents with  . Annual Exam    HPI: Jody [name] is a 23 y.o. female who presents to [place] [facility] today for a Female Wellness Visit.  She also has the concerns and/or needs as listed above in the chief complaint. These will be addressed in addition to the Health Maintenance Visit.   Wellness Visit: annual visit with health maintenance review and exam without Pap   23 yo female with CP, congenital hydropcephalus who is wheelchair dependent and has a seizure d/o presents for wellness visit and needs note for jury duty excuse.   She is unable to serve due to her wheelchair dependence and related medical disabilities as listed below.   Overall, her mom reports she is doing well. Jody [name] reports she feels well. No concerns. She is overdue for her f/u with neuro and PMR at [facility]. Mom is making those appts now.   Chronic disease management visit and/or acute problem visit:  H/o iron deficiency anemia with heavy irregular menses. I reviewed notes from ER in December. She is currently on iron and megace and bleeding has stopped. She hasn't yet made f/u with Gyn for further management discussion. She reports that most menses are ok; in December it was unusually heavy. Energy level is fine. She last saw gyn in 09/2017. I reviewed that note.   No new problems.   Assessment  1. Annual physical exam   2. Cerebral palsy, diplegic (HCC)   3. VP (ventriculoperitoneal) shunt status   4. Iron deficiency anemia due to chronic blood loss   5. Irregular menstrual bleeding   6. Need for immunization against influenza      Plan  Female Wellness Visit:  Age appropriate Health Maintenance and Prevention measures were discussed with patient. Included topics are cancer screening recommendations, ways to keep healthy (see AVS) including dietary and exercise recommendations, regular eye and dental care, use of seat belts, and avoidance of  moderate alcohol use and tobacco use. Not a candidate for pap smears routinely. Never sexually active. She has had the HPV vaccination series as well.   BMI: discussed patient's BMI and encouraged positive lifestyle modifications to help get to or maintain a target BMI.  HM needs and immunizations were addressed and ordered. See below for orders. See HM and immunization section for updates.  Routine labs and screening tests ordered including cmp, cbc and lipids where appropriate.  Discussed recommendations regarding Vit D and calcium supplementation (see AVS)  Chronic disease f/u and/or acute problem visit: (deemed necessary to be done in addition to the wellness visit):  Menorrhagia and anemia:  Recheck levels today on iron. Will f/u with GYN to discuss management options. Discussed benefits of IUD.   To see neuro to f/o on her VP shunt/hydrocephalus and PMR for spacticity   Follow up: Return in about 1 year (around 06/02/2019) for complete physical.   Orders Placed This Encounter  Procedures  . Flu Vaccine QUAD 36+ mos IM  . CBC with Differential/Platelet  . Comprehensive metabolic panel  . Lipid panel  . TSH  . Iron, TIBC and Ferritin Panel   Meds ordered this encounter  Medications  . propranolol (INDERAL) 20 MG tablet    Sig: Take 1 tablet (20 mg total) by mouth 2 (two) times daily.    Dispense:  180 tablet    Refill:  3      Lifestyle: Body mass index is 28.29 kg/m. Wt Readings from Last  3 Encounters:  06/01/18 170 lb (77.1 kg)  07/26/14 160 lb (72.6 kg) (89 %, Z= 1.21)*  04/05/13 153 lb (69.4 kg) (87 %, Z= 1.13)*   * Growth percentiles are based on CDC (Girls, 2-20 Years) data.     Patient Active Problem List   Diagnosis Date Noted  . VP (ventriculoperitoneal) shunt status 03/28/2017    Priority: High  . Cerebral palsy, diplegic ([place]) 02/06/2013    Priority: High  . Localization-related symptomatic epilepsy and epileptic syndromes with complex partial  seizures, not intractable, without status epilepticus ([place]) 02/06/2013    Priority: High  . Congenital hydrocephalus ([place]) 05/26/2008    Priority: High  . Hidradenitis suppurativa 08/07/2014    Priority: Medium  . Migraine 02/06/2013    Priority: Medium  . Neurogenic bladder 10/17/2002    Priority: Medium  . Constipated 03/27/2015    Priority: Low  . Exotropia 05/21/2010    Priority: Low  . Microcytic anemia 04/03/2017    hgb 10.9, mcv 78, 03/2017 see [facility] labs.    . Vitamin D deficiency 04/03/2017    Vit d 25oh, 12.7 03/2017 - see [facility] labs    Health Maintenance  Topic Date Due  . PAP-Cervical Cytology Screening  10/15/2016  . INFLUENZA VACCINE  11/30/2017  . TETANUS/TDAP  03/29/2027  . PAP SMEAR-Modifier  Discontinued  . HIV Screening  Discontinued   Immunization History  Administered Date(s) Administered  . DTaP 12/17/1995, 02/28/1996, 05/22/1996, 01/30/1997, 03/06/1997  . HPV Quadrivalent 09/12/2012, 11/14/2012, 03/19/2013  . Hepatitis A, Adult 02/21/2005, 01/24/2007  . Hepatitis B, adult 12/20/1995, 02/28/1996, 08/21/1996  . HiB (PRP-OMP) 12/18/1995, 02/28/1996, 05/22/1996, 01/30/1997  . IPV 12/17/1995, 02/28/1996, 10/30/1996, 11/08/1996  . Influenza,inj,Quad PF,6+ Mos 02/06/2013, 03/28/2017, 06/01/2018  . MMR 04/18/1997, 12/15/2000  . Meningococcal Conjugate 06/13/2007, 09/12/2012  . Td 03/28/2017  . Tdap 01/24/2007  . Varicella 04/18/1997, 03/13/2006   We updated and reviewed the patient's past history in detail and it is documented below. Allergies: Patient  reports no history of alcohol use. Past Medical History Patient  has a past medical history of Cerebral palsy ([place]), Constipation, GERD (gastroesophageal reflux disease), History of encephalopathy, Hydradenitis, Hydrocephaly ([place]), Migraine, Neurogenic bladder, Seizures ([place]), Vitamin D deficiency (04/03/2017), and VP (ventriculoperitoneal) shunt status. Past Surgical History Patient  has a past surgical  history that includes shunt replacement (2008); Eye surgery; Hamstring lengthening; Tendon lengthening; Botox injection; Dental examination under anesthesia; and Ventriculoperitoneal shunt. Social History   Socioeconomic History  . Marital status: Single    Spouse name: Not on file  . Number of children: Not on file  . Years of education: Not on file  . Highest education level: Not on file  Occupational History  . Not on file  Social Needs  . Financial resource strain: Not on file  . Food insecurity:    Worry: Not on file    Inability: Not on file  . Transportation needs:    Medical: Not on file    Non-medical: Not on file  Tobacco Use  . Smoking status: Never Smoker  . Smokeless tobacco: Never Used  Substance and Sexual Activity  . Alcohol use: No  . Drug use: No  . Sexual activity: Never    Birth control/protection: None  Lifestyle  . Physical activity:    Days per week: Not on file    Minutes per session: Not on file  . Stress: Not on file  Relationships  . Social connections:    Talks on phone: Not on  file    Gets together: Not on file    Attends religious service: Not on file    Active member of club or organization: Not on file    Attends meetings of clubs or organizations: Not on file    Relationship status: Not on file  Other Topics Concern  . Not on file  Social History Narrative  . Not on file   Family History  Problem Relation Age of Onset  . Diabetes Mother   . Hyperlipidemia Mother   . Hypertension Mother   . Obesity Mother   . Diabetes Brother   . Obesity Brother   . Gastric cancer Maternal Grandfather   . Diabetes Maternal Grandfather     Review of Systems: Constitutional: negative for fever or malaise Ophthalmic: negative for photophobia, double vision or loss of vision Cardiovascular: negative for chest pain, dyspnea on exertion, or new LE swelling Respiratory: negative for SOB or persistent cough Gastrointestinal: negative for abdominal  pain, change in bowel habits or melena Genitourinary: negative for dysuria or gross hematuria, no abnormal uterine bleeding or disharge Musculoskeletal: negative for new gait disturbance or muscular weakness Integumentary: negative for new or persistent rashes, no breast lumps Neurological: negative for TIA or stroke symptoms Psychiatric: negative for SI or delusions Allergic/Immunologic: negative for hives  Patient Care Team    Relationship Specialty Notifications Start End  Leamon Arnt, MD PCP - General Family Medicine  03/28/17   Melanee Left, NP Nurse Practitioner Neurology  03/28/17   Enriqueta Shutter, MD Referring Physician Dermatology  03/28/17   Loleta Dicker., MD  Orthopedic Surgery  03/28/17   Sloan Leiter, MD Consulting Physician Obstetrics and Gynecology  06/01/18     Objective  Vitals: BP 118/76   Pulse 97   Temp 98.3 F (36.8 C) (Oral)   Resp 16   Wt 170 lb (77.1 kg)   LMP 05/14/2018   SpO2 97%   BMI 28.29 kg/m  General:  Well developed, well nourished, no acute distress , quiet, in wheelchair Psych:  Alert and orientedx3,normal mood and affect HEENT:  Normocephalic, atraumatic, non-icteric sclera, PERRL, oropharynx is clear without mass or exudate, supple neck without adenopathy, mass or thyromegaly Cardiovascular:  Normal S1, S2, RRR without gallop, rub or murmur, nondisplaced PMI Respiratory:  Good breath sounds bilaterally, CTAB with normal respiratory effort Gastrointestinal: normal bowel sounds, soft, non-tender, no noted masses. No HSM Skin:  Warm, no rashes or suspicious lesions noted     Commons side effects, risks, benefits, and alternatives for medications and treatment plan prescribed today were discussed, and the patient expressed understanding of the given instructions. Patient is instructed to call or message via MyChart if he/she has any questions or concerns regarding our treatment plan. No barriers to understanding were  identified. We discussed Red Flag symptoms and signs in detail. Patient expressed understanding regarding what to do in case of urgent or emergency type symptoms.   Medication list was reconciled, printed and provided to the patient in AVS. Patient instructions and summary information was reviewed with the patient as documented in the AVS. This note was prepared with assistance of Dragon voice recognition software. Occasional wrong-word or sound-a-like substitutions may have occurred due to the inherent limitations of voice recognition software

## 2018-06-01 NOTE — Patient Instructions (Signed)
Please return in 12 months for your annual complete physical; please come fasting.  Follow up with your [facility] specialists and make an appointment with GYN to discuss heavy periods again. Consider getting the IUD.   If you have any questions or concerns, please don't hesitate to send me a message via MyChart or call the office at [phone]. Thank you for visiting with [place] today! It's our pleasure caring for you.

## 2018-06-02 LAB — CBC WITH DIFFERENTIAL/PLATELET
Absolute Monocytes: 721 cells/uL (ref 200–950)
Basophils Absolute: 31 cells/uL (ref 0–200)
Basophils Relative: 0.3 %
Eosinophils Absolute: 124 cells/uL (ref 15–500)
Eosinophils Relative: 1.2 %
HCT: 36.8 % (ref 35.0–45.0)
Hemoglobin: 11.1 g/dL — ABNORMAL LOW (ref 11.7–15.5)
Lymphs Abs: 1597 cells/uL (ref 850–3900)
MCH: 20.1 pg — ABNORMAL LOW (ref 27.0–33.0)
MCHC: 30.2 g/dL — ABNORMAL LOW (ref 32.0–36.0)
MCV: 66.5 fL — AB (ref 80.0–100.0)
Monocytes Relative: 7 %
NEUTROS PCT: 76 %
Neutro Abs: 7828 cells/uL — ABNORMAL HIGH (ref 1500–7800)
Platelets: 509 10*3/uL — ABNORMAL HIGH (ref 140–400)
RBC: 5.53 10*6/uL — ABNORMAL HIGH (ref 3.80–5.10)
RDW: 21.6 % — ABNORMAL HIGH (ref 11.0–15.0)
Total Lymphocyte: 15.5 %
WBC: 10.3 10*3/uL (ref 3.8–10.8)

## 2018-06-02 LAB — LIPID PANEL
Cholesterol: 152 mg/dL (ref <200)
HDL: 40 mg/dL — ABNORMAL LOW (ref >50)
LDL Cholesterol (Calc): 94 mg/dL (calc)
Non-HDL Cholesterol (Calc): 112 mg/dL (calc) (ref <130)
Total CHOL/HDL Ratio: 3.8 (calc) (ref <5.0)
Triglycerides: 87 mg/dL (ref <150)

## 2018-06-02 LAB — CBC MORPHOLOGY

## 2018-06-02 LAB — IRON,TIBC AND FERRITIN PANEL
%SAT: 4 % (calc) — ABNORMAL LOW (ref 16–45)
FERRITIN: 9 ng/mL — AB (ref 16–154)
Iron: 17 ug/dL — ABNORMAL LOW (ref 40–190)
TIBC: 447 ug/dL (ref 250–450)

## 2018-06-02 LAB — COMPREHENSIVE METABOLIC PANEL
AG RATIO: 1.6 (calc) (ref 1.0–2.5)
ALT: 17 U/L (ref 6–29)
AST: 12 U/L (ref 10–30)
Albumin: 4.5 g/dL (ref 3.6–5.1)
Alkaline phosphatase (APISO): 55 U/L (ref 33–115)
BUN: 7 mg/dL (ref 7–25)
CALCIUM: 9.8 mg/dL (ref 8.6–10.2)
CO2: 20 mmol/L (ref 20–32)
Chloride: 108 mmol/L (ref 98–110)
Creat: 0.67 mg/dL (ref 0.50–1.10)
Globulin: 2.8 g/dL (calc) (ref 1.9–3.7)
Glucose, Bld: 84 mg/dL (ref 65–99)
Potassium: 3.9 mmol/L (ref 3.5–5.3)
Sodium: 139 mmol/L (ref 135–146)
Total Bilirubin: 0.2 mg/dL (ref 0.2–1.2)
Total Protein: 7.3 g/dL (ref 6.1–8.1)

## 2018-06-02 LAB — TSH: TSH: 6.85 mIU/L — ABNORMAL HIGH

## 2018-06-04 ENCOUNTER — Encounter: Payer: Self-pay | Admitting: Family Medicine

## 2018-06-04 DIAGNOSIS — E039 Hypothyroidism, unspecified: Secondary | ICD-10-CM | POA: Insufficient documentation

## 2018-06-04 DIAGNOSIS — E038 Other specified hypothyroidism: Secondary | ICD-10-CM

## 2018-06-04 HISTORY — DX: Other specified hypothyroidism: E03.8

## 2018-06-29 ENCOUNTER — Other Ambulatory Visit: Payer: Self-pay | Admitting: Family Medicine

## 2018-08-22 ENCOUNTER — Other Ambulatory Visit: Payer: Self-pay | Admitting: Family Medicine

## 2018-09-27 ENCOUNTER — Other Ambulatory Visit: Payer: Self-pay | Admitting: Family Medicine

## 2018-09-28 NOTE — Telephone Encounter (Signed)
Are these ok to refill? 

## 2018-10-01 ENCOUNTER — Telehealth: Payer: Self-pay

## 2018-10-01 NOTE — Telephone Encounter (Signed)
Copied from CRM [id number]. Topic: Quick Communication - Home Health Verbal Orders >> Oct 01, 2018 12:21 PM Randol Kern wrote: Caller/Agency: Darilyn, [facility]  Callback Number: [phone] extension [phone] Requesting OT/PT/Skilled Nursing/Social Work/Speech Therapy: recent fax in regards to [facility] form [phone], please advise

## 2018-10-01 NOTE — Telephone Encounter (Signed)
LMOVM with orders 

## 2018-11-02 ENCOUNTER — Other Ambulatory Visit: Payer: Self-pay | Admitting: Family Medicine

## 2018-11-05 ENCOUNTER — Other Ambulatory Visit: Payer: Self-pay | Admitting: Family Medicine

## 2018-12-12 ENCOUNTER — Other Ambulatory Visit: Payer: Self-pay | Admitting: Family Medicine

## 2019-01-22 ENCOUNTER — Other Ambulatory Visit: Payer: Self-pay | Admitting: Family Medicine

## 2019-02-19 ENCOUNTER — Other Ambulatory Visit: Payer: Self-pay | Admitting: Family Medicine

## 2019-02-21 ENCOUNTER — Other Ambulatory Visit: Payer: Self-pay | Admitting: Family Medicine

## 2019-04-21 ENCOUNTER — Other Ambulatory Visit: Payer: Self-pay | Admitting: Family Medicine

## 2019-04-22 ENCOUNTER — Telehealth: Payer: Self-pay | Admitting: Family Medicine

## 2019-04-22 DIAGNOSIS — G809 Cerebral palsy, unspecified: Secondary | ICD-10-CM | POA: Diagnosis not present

## 2019-04-22 NOTE — Telephone Encounter (Signed)
Sure. thanks 

## 2019-04-22 NOTE — Telephone Encounter (Signed)
See below. Is this ok?

## 2019-04-22 NOTE — Telephone Encounter (Signed)
Pt mom is calling and she would a letter for her daughter to have ESA emotional support animal or service animal, or therapeutic or psychiatric service animal  Pt is disable and home bound. Pt has depression/anxiety and also VPS shunt in her brain and pt has seizures. Pt also has other issues. Pt mom would pick up the letter

## 2019-04-22 NOTE — Telephone Encounter (Signed)
See note

## 2019-04-23 ENCOUNTER — Encounter: Payer: Self-pay | Admitting: Family Medicine

## 2019-04-23 NOTE — Telephone Encounter (Signed)
Letter written. Patient's mother notified that letter is ready to be picked up.

## 2019-04-30 ENCOUNTER — Other Ambulatory Visit: Payer: Self-pay

## 2019-04-30 DIAGNOSIS — L732 Hidradenitis suppurativa: Secondary | ICD-10-CM

## 2019-04-30 MED ORDER — ZONISAMIDE 100 MG PO CAPS: 300.0000 mg | ORAL_CAPSULE | Freq: Every day | ORAL | 3 refills | Status: DC

## 2019-05-01 DIAGNOSIS — G809 Cerebral palsy, unspecified: Secondary | ICD-10-CM | POA: Diagnosis not present

## 2019-05-01 DIAGNOSIS — R32 Unspecified urinary incontinence: Secondary | ICD-10-CM | POA: Diagnosis not present

## 2019-05-01 DIAGNOSIS — G910 Communicating hydrocephalus: Secondary | ICD-10-CM | POA: Diagnosis not present

## 2019-05-01 DIAGNOSIS — I499 Cardiac arrhythmia, unspecified: Secondary | ICD-10-CM | POA: Diagnosis not present

## 2019-05-09 DIAGNOSIS — Z20828 Contact with and (suspected) exposure to other viral communicable diseases: Secondary | ICD-10-CM | POA: Diagnosis not present

## 2019-05-14 ENCOUNTER — Other Ambulatory Visit: Payer: Medicare HMO

## 2019-05-15 ENCOUNTER — Emergency Department (HOSPITAL_COMMUNITY): Payer: Medicare HMO

## 2019-05-15 ENCOUNTER — Inpatient Hospital Stay (HOSPITAL_COMMUNITY)
Admission: EM | Admit: 2019-05-15 | Discharge: 2019-05-19 | DRG: 178 | Disposition: A | Discharge status: Home / Self Care | Payer: Medicare HMO | Attending: Internal Medicine | Admitting: Internal Medicine

## 2019-05-15 ENCOUNTER — Inpatient Hospital Stay (HOSPITAL_COMMUNITY): Payer: Medicare HMO

## 2019-05-15 DIAGNOSIS — Z8349 Family history of other endocrine, nutritional and metabolic diseases: Secondary | ICD-10-CM | POA: Diagnosis not present

## 2019-05-15 DIAGNOSIS — R0902 Hypoxemia: Secondary | ICD-10-CM | POA: Diagnosis not present

## 2019-05-15 DIAGNOSIS — G801 Spastic diplegic cerebral palsy: Secondary | ICD-10-CM | POA: Diagnosis not present

## 2019-05-15 DIAGNOSIS — N939 Abnormal uterine and vaginal bleeding, unspecified: Secondary | ICD-10-CM | POA: Diagnosis not present

## 2019-05-15 DIAGNOSIS — E876 Hypokalemia: Secondary | ICD-10-CM | POA: Diagnosis present

## 2019-05-15 DIAGNOSIS — Z993 Dependence on wheelchair: Secondary | ICD-10-CM

## 2019-05-15 DIAGNOSIS — G919 Hydrocephalus, unspecified: Secondary | ICD-10-CM | POA: Diagnosis present

## 2019-05-15 DIAGNOSIS — Z888 Allergy status to other drugs, medicaments and biological substances status: Secondary | ICD-10-CM | POA: Diagnosis not present

## 2019-05-15 DIAGNOSIS — Z885 Allergy status to narcotic agent status: Secondary | ICD-10-CM | POA: Diagnosis not present

## 2019-05-15 DIAGNOSIS — Z833 Family history of diabetes mellitus: Secondary | ICD-10-CM | POA: Diagnosis not present

## 2019-05-15 DIAGNOSIS — T8509XA Other mechanical complication of ventricular intracranial (communicating) shunt, initial encounter: Secondary | ICD-10-CM | POA: Diagnosis not present

## 2019-05-15 DIAGNOSIS — E538 Deficiency of other specified B group vitamins: Secondary | ICD-10-CM | POA: Diagnosis not present

## 2019-05-15 DIAGNOSIS — D649 Anemia, unspecified: Secondary | ICD-10-CM | POA: Diagnosis not present

## 2019-05-15 DIAGNOSIS — N920 Excessive and frequent menstruation with regular cycle: Secondary | ICD-10-CM | POA: Diagnosis not present

## 2019-05-15 DIAGNOSIS — R000 Tachycardia, unspecified: Secondary | ICD-10-CM | POA: Diagnosis not present

## 2019-05-15 DIAGNOSIS — G40909 Epilepsy, unspecified, not intractable, without status epilepticus: Secondary | ICD-10-CM | POA: Diagnosis present

## 2019-05-15 DIAGNOSIS — D519 Vitamin B12 deficiency anemia, unspecified: Secondary | ICD-10-CM | POA: Diagnosis not present

## 2019-05-15 DIAGNOSIS — A0839 Other viral enteritis: Secondary | ICD-10-CM | POA: Diagnosis present

## 2019-05-15 DIAGNOSIS — E038 Other specified hypothyroidism: Secondary | ICD-10-CM | POA: Diagnosis present

## 2019-05-15 DIAGNOSIS — Z982 Presence of cerebrospinal fluid drainage device: Secondary | ICD-10-CM | POA: Diagnosis not present

## 2019-05-15 DIAGNOSIS — E039 Hypothyroidism, unspecified: Secondary | ICD-10-CM | POA: Diagnosis present

## 2019-05-15 DIAGNOSIS — Z8249 Family history of ischemic heart disease and other diseases of the circulatory system: Secondary | ICD-10-CM

## 2019-05-15 DIAGNOSIS — E559 Vitamin D deficiency, unspecified: Secondary | ICD-10-CM | POA: Diagnosis present

## 2019-05-15 DIAGNOSIS — U071 COVID-19: Principal | ICD-10-CM | POA: Diagnosis present

## 2019-05-15 DIAGNOSIS — G808 Other cerebral palsy: Secondary | ICD-10-CM

## 2019-05-15 DIAGNOSIS — D509 Iron deficiency anemia, unspecified: Secondary | ICD-10-CM | POA: Diagnosis not present

## 2019-05-15 DIAGNOSIS — R0602 Shortness of breath: Secondary | ICD-10-CM | POA: Diagnosis not present

## 2019-05-15 DIAGNOSIS — I959 Hypotension, unspecified: Secondary | ICD-10-CM | POA: Diagnosis not present

## 2019-05-15 DIAGNOSIS — I10 Essential (primary) hypertension: Secondary | ICD-10-CM | POA: Diagnosis not present

## 2019-05-15 DIAGNOSIS — R110 Nausea: Secondary | ICD-10-CM | POA: Diagnosis not present

## 2019-05-15 DIAGNOSIS — R531 Weakness: Secondary | ICD-10-CM | POA: Diagnosis not present

## 2019-05-15 DIAGNOSIS — D500 Iron deficiency anemia secondary to blood loss (chronic): Secondary | ICD-10-CM | POA: Diagnosis present

## 2019-05-15 HISTORY — DX: Anemia, unspecified: D64.9

## 2019-05-15 LAB — URINALYSIS, ROUTINE W REFLEX MICROSCOPIC
Bilirubin Urine: NEGATIVE
Glucose, UA: NEGATIVE mg/dL
Ketones, ur: 20 mg/dL — AB
Leukocytes,Ua: NEGATIVE
Nitrite: NEGATIVE
Protein, ur: NEGATIVE mg/dL
Specific Gravity, Urine: 1.01 (ref 1.005–1.030)
pH: 6 (ref 5.0–8.0)

## 2019-05-15 LAB — CBC
HCT: 25.1 % — ABNORMAL LOW (ref 36.0–46.0)
Hemoglobin: 6.9 g/dL — CL (ref 12.0–15.0)
MCH: 19.2 pg — ABNORMAL LOW (ref 26.0–34.0)
MCHC: 27.5 g/dL — ABNORMAL LOW (ref 30.0–36.0)
MCV: 69.9 fL — ABNORMAL LOW (ref 80.0–100.0)
Platelets: 264 10*3/uL (ref 150–400)
RBC: 3.59 MIL/uL — ABNORMAL LOW (ref 3.87–5.11)
RDW: 21.2 % — ABNORMAL HIGH (ref 11.5–15.5)
WBC: 5.6 10*3/uL (ref 4.0–10.5)
nRBC: 0 % (ref 0.0–0.2)

## 2019-05-15 LAB — OCCULT BLOOD X 1 CARD TO LAB, STOOL: Fecal Occult Bld: NEGATIVE

## 2019-05-15 LAB — COMPREHENSIVE METABOLIC PANEL
ALT: 19 U/L (ref 0–44)
AST: 18 U/L (ref 15–41)
Albumin: 3.4 g/dL — ABNORMAL LOW (ref 3.5–5.0)
Alkaline Phosphatase: 42 U/L (ref 38–126)
Anion gap: 8 (ref 5–15)
BUN: 7 mg/dL (ref 6–20)
CO2: 19 mmol/L — ABNORMAL LOW (ref 22–32)
Calcium: 7.6 mg/dL — ABNORMAL LOW (ref 8.9–10.3)
Chloride: 111 mmol/L (ref 98–111)
Creatinine, Ser: 0.54 mg/dL (ref 0.44–1.00)
GFR calc Af Amer: >60 mL/min (ref >60)
GFR calc non Af Amer: >60 mL/min (ref >60)
Glucose, Bld: 94 mg/dL (ref 70–99)
Potassium: 3.3 mmol/L — ABNORMAL LOW (ref 3.5–5.1)
Sodium: 138 mmol/L (ref 135–145)
Total Bilirubin: 0.6 mg/dL (ref 0.3–1.2)
Total Protein: 6.1 g/dL — ABNORMAL LOW (ref 6.5–8.1)

## 2019-05-15 LAB — RETICULOCYTES
Immature Retic Fract: 10.4 % (ref 2.3–15.9)
RBC.: 3.51 MIL/uL — ABNORMAL LOW (ref 3.87–5.11)
Retic Count, Absolute: 49.5 10*3/uL (ref 19.0–186.0)
Retic Ct Pct: 1.4 % (ref 0.4–3.1)

## 2019-05-15 LAB — IRON AND TIBC
Iron: 9 ug/dL — ABNORMAL LOW (ref 28–170)
Saturation Ratios: 3 % — ABNORMAL LOW (ref 10.4–31.8)
TIBC: 269 ug/dL (ref 250–450)
UIBC: 260 ug/dL

## 2019-05-15 LAB — VITAMIN B12: Vitamin B-12: 173 pg/mL — ABNORMAL LOW (ref 180–914)

## 2019-05-15 LAB — LACTATE DEHYDROGENASE: LDH: 122 U/L (ref 98–192)

## 2019-05-15 LAB — C-REACTIVE PROTEIN: CRP: 0.7 mg/dL (ref <1.0)

## 2019-05-15 LAB — I-STAT BETA HCG BLOOD, ED (MC, WL, AP ONLY): I-stat hCG, quantitative: <5 m[IU]/mL (ref <5)

## 2019-05-15 LAB — PREPARE RBC (CROSSMATCH)

## 2019-05-15 LAB — FIBRINOGEN: Fibrinogen: 301 mg/dL (ref 210–475)

## 2019-05-15 LAB — POC SARS CORONAVIRUS 2 AG -  ED: SARS Coronavirus 2 Ag: POSITIVE — AB

## 2019-05-15 LAB — D-DIMER, QUANTITATIVE: D-Dimer, Quant: 0.98 ug/mL-FEU — ABNORMAL HIGH (ref 0.00–0.50)

## 2019-05-15 LAB — FOLATE: Folate: 6.1 ng/mL (ref >5.9)

## 2019-05-15 LAB — FERRITIN: Ferritin: 2 ng/mL — ABNORMAL LOW (ref 11–307)

## 2019-05-15 LAB — HIV ANTIBODY (ROUTINE TESTING W REFLEX): HIV Screen 4th Generation wRfx: NONREACTIVE

## 2019-05-15 LAB — ABO/RH: ABO/RH(D): AB POS

## 2019-05-15 MED ORDER — BACLOFEN 5 MG HALF TABLET
5.0000 mg | ORAL_TABLET | Freq: Every day | ORAL | Status: DC
  Administered 2019-05-16 – 2019-05-19 (×4): 5 mg via ORAL
  Filled 2019-05-15 (×4): qty 1

## 2019-05-15 MED ORDER — ZONISAMIDE 100 MG PO CAPS
300.0000 mg | ORAL_CAPSULE | Freq: Every day | ORAL | Status: DC
  Administered 2019-05-15 – 2019-05-19 (×5): 300 mg via ORAL
  Filled 2019-05-15 (×6): qty 3

## 2019-05-15 MED ORDER — CYANOCOBALAMIN 1000 MCG/ML IJ SOLN
1000.0000 ug | Freq: Every day | INTRAMUSCULAR | Status: DC
  Administered 2019-05-15: 1000 ug via INTRAMUSCULAR
  Filled 2019-05-15 (×2): qty 1

## 2019-05-15 MED ORDER — FOLIC ACID 1 MG PO TABS
1.0000 mg | ORAL_TABLET | Freq: Every day | ORAL | Status: DC
  Administered 2019-05-15 – 2019-05-19 (×5): 1 mg via ORAL
  Filled 2019-05-15 (×5): qty 1

## 2019-05-15 MED ORDER — SODIUM CHLORIDE 0.9 % IV SOLN
10.0000 mL/h | Freq: Once | INTRAVENOUS | Status: AC
  Administered 2019-05-15: 10 mL/h via INTRAVENOUS

## 2019-05-15 MED ORDER — ONDANSETRON HCL 4 MG/2ML IJ SOLN
4.0000 mg | Freq: Four times a day (QID) | INTRAMUSCULAR | Status: DC | PRN
  Administered 2019-05-16: 4 mg via INTRAVENOUS
  Filled 2019-05-15: qty 2

## 2019-05-15 MED ORDER — BACLOFEN 5 MG HALF TABLET
15.0000 mg | ORAL_TABLET | Freq: Every day | ORAL | Status: DC
  Administered 2019-05-15 – 2019-05-18 (×4): 15 mg via ORAL
  Filled 2019-05-15 (×2): qty 1
  Filled 2019-05-15: qty 2
  Filled 2019-05-15 (×3): qty 1

## 2019-05-15 MED ORDER — ZINC SULFATE 220 (50 ZN) MG PO CAPS
220.0000 mg | ORAL_CAPSULE | Freq: Every day | ORAL | Status: DC
  Administered 2019-05-15 – 2019-05-19 (×5): 220 mg via ORAL
  Filled 2019-05-15 (×5): qty 1

## 2019-05-15 MED ORDER — BACLOFEN 10 MG PO TABS: 5.0000 mg | ORAL_TABLET | ORAL | Status: DC

## 2019-05-15 MED ORDER — ONDANSETRON HCL 4 MG PO TABS
4.0000 mg | ORAL_TABLET | Freq: Four times a day (QID) | ORAL | Status: DC | PRN
  Administered 2019-05-16 – 2019-05-17 (×2): 4 mg via ORAL
  Filled 2019-05-15 (×2): qty 1

## 2019-05-15 MED ORDER — ALBUTEROL SULFATE HFA 108 (90 BASE) MCG/ACT IN AERS
2.0000 | INHALATION_SPRAY | RESPIRATORY_TRACT | Status: DC | PRN
  Filled 2019-05-15: qty 6.7

## 2019-05-15 MED ORDER — SODIUM CHLORIDE 0.9 % IV SOLN
200.0000 mg | Freq: Once | INTRAVENOUS | Status: AC
  Administered 2019-05-15: 200 mg via INTRAVENOUS
  Filled 2019-05-15: qty 200

## 2019-05-15 MED ORDER — CYCLOBENZAPRINE HCL 5 MG PO TABS
5.0000 mg | ORAL_TABLET | Freq: Three times a day (TID) | ORAL | Status: DC | PRN
  Filled 2019-05-15: qty 1

## 2019-05-15 MED ORDER — SODIUM CHLORIDE 0.9 % IV SOLN
100.0000 mg | Freq: Every day | INTRAVENOUS | Status: AC
  Administered 2019-05-16 – 2019-05-19 (×4): 100 mg via INTRAVENOUS
  Filled 2019-05-15 (×5): qty 20

## 2019-05-15 MED ORDER — ASCORBIC ACID 500 MG PO TABS
500.0000 mg | ORAL_TABLET | Freq: Every day | ORAL | Status: DC
  Administered 2019-05-15 – 2019-05-19 (×5): 500 mg via ORAL
  Filled 2019-05-15 (×5): qty 1

## 2019-05-15 MED ORDER — SODIUM CHLORIDE 0.9 % IV BOLUS
1000.0000 mL | Freq: Once | INTRAVENOUS | Status: AC
  Administered 2019-05-15: 1000 mL via INTRAVENOUS

## 2019-05-15 MED ORDER — SODIUM CHLORIDE 0.9 % IV SOLN
510.0000 mg | Freq: Once | INTRAVENOUS | Status: AC
  Administered 2019-05-15: 510 mg via INTRAVENOUS
  Filled 2019-05-15: qty 17

## 2019-05-15 NOTE — ED Notes (Addendum)
Date and time results received: 05/15/19 9:29 AM   Test: hemoglobin  Critical Value: 6.9  Bero, MD made aware.

## 2019-05-15 NOTE — ED Provider Notes (Signed)
[facility]-EMERGENCY DEPT [facility] Emergency Department Provider Note MRN:  [id number]  Arrival date & time: 05/15/19     Chief Complaint   Nausea and Weakness   History of Present Illness   Jody [name] is a 24 y.o. year-old female with a history of cerebral palsy presenting to the ED with chief complaint of nausea and weakness.  Patient reports a recent possible exposure to COVID-19.  For the past 4 days has been experiencing intermittent nausea, vomiting, diarrhea, fever, decreased energy, weakness, malaise.  Denies chest pain or shortness of breath, no abdominal pain, no cough.  Symptoms mild, constant, no exacerbating or alleviating factors.  Review of Systems  A complete 10 system review of systems was obtained and all systems are negative except as noted in the HPI and PMH.   Patient's Health History    Past Medical History:  Diagnosis Date  . Cerebral palsy (HCC)   . Constipation   . GERD (gastroesophageal reflux disease)   . History of encephalopathy   . Hydradenitis    bilateral  . Hydrocephaly (HCC)   . Migraine   . Neurogenic bladder   . Seizures (HCC)   . Subclinical hypothyroidism 06/04/2018   04/2018  . Vitamin D deficiency 04/03/2017   Vit d 25oh, 12.7 03/2017 - see [facility] labs  . VP (ventriculoperitoneal) shunt status     Past Surgical History:  Procedure Laterality Date  . BOTOX INJECTION    . DENTAL EXAMINATION UNDER ANESTHESIA    . EYE SURGERY    . HAMSTRING LENGTHENING    . shunt replacement  2008   5th shunt for pt.   . TENDON LENGTHENING    . VENTRICULOPERITONEAL SHUNT      Family History  Problem Relation Age of Onset  . Diabetes Mother   . Hyperlipidemia Mother   . Hypertension Mother   . Obesity Mother   . Diabetes Brother   . Obesity Brother   . Gastric cancer Maternal Grandfather   . Diabetes Maternal Grandfather     Social History   Socioeconomic History  . Marital status: Single    Spouse name: Not on file  . Number of  children: Not on file  . Years of education: Not on file  . Highest education level: Not on file  Occupational History  . Not on file  Tobacco Use  . Smoking status: Never Smoker  . Smokeless tobacco: Never Used  Substance and Sexual Activity  . Alcohol use: No  . Drug use: No  . Sexual activity: Never    Birth control/protection: None  Other Topics Concern  . Not on file  Social History Narrative  . Not on file   Social Determinants of Health   Financial Resource Strain:   . Difficulty of Paying Living Expenses: Not on file  Food Insecurity:   . Worried About Programme researcher, broadcasting/film/video in the Last Year: Not on file  . Ran Out of Food in the Last Year: Not on file  Transportation Needs:   . Lack of Transportation (Medical): Not on file  . Lack of Transportation (Non-Medical): Not on file  Physical Activity:   . Days of Exercise per Week: Not on file  . Minutes of Exercise per Session: Not on file  Stress:   . Feeling of Stress : Not on file  Social Connections:   . Frequency of Communication with Friends and Family: Not on file  . Frequency of Social Gatherings with Friends and Family: Not  on file  . Attends Religious Services: Not on file  . Active Member of Clubs or Organizations: Not on file  . Attends Archivist Meetings: Not on file  . Marital Status: Not on file  Intimate Partner Violence:   . Fear of Current or Ex-Partner: Not on file  . Emotionally Abused: Not on file  . Physically Abused: Not on file  . Sexually Abused: Not on file     Physical Exam  Vital Signs and Nursing Notes reviewed Vitals:   05/15/19 1339 05/15/19 1400  BP: 112/68 (!) 106/59  Pulse: 98 96  Resp: (!) 26 (!) 26  Temp: 98.4 F (36.9 C)   SpO2: 100% 100%    CONSTITUTIONAL: Well-appearing, NAD NEURO:  Alert and oriented x 3, no focal deficits EYES:  eyes equal and reactive ENT/NECK:  no LAD, no JVD CARDIO: Regular rate, well-perfused, normal S1 and S2 PULM:  CTAB no  wheezing or rhonchi, mildly tachypneic GI/GU:  normal bowel sounds, non-distended, non-tender MSK/SPINE: Chronic spastic deformities, no edema SKIN:  no rash, atraumatic PSYCH:  Appropriate speech and behavior  Diagnostic and Interventional Summary    EKG Interpretation  Date/Time:    Ventricular Rate:    PR Interval:    QRS Duration:   QT Interval:    QTC Calculation:   R Axis:     Text Interpretation:        Labs Reviewed  CBC - Abnormal; Notable for the following components:      Result Value   RBC 3.59 (*)    Hemoglobin 6.9 (*)    HCT 25.1 (*)    MCV 69.9 (*)    MCH 19.2 (*)    MCHC 27.5 (*)    RDW 21.2 (*)    All other components within normal limits  COMPREHENSIVE METABOLIC PANEL - Abnormal; Notable for the following components:   Potassium 3.3 (*)    CO2 19 (*)    Calcium 7.6 (*)    Total Protein 6.1 (*)    Albumin 3.4 (*)    All other components within normal limits  URINALYSIS, ROUTINE W REFLEX MICROSCOPIC - Abnormal; Notable for the following components:   Hgb urine dipstick LARGE (*)    Ketones, ur 20 (*)    Bacteria, UA RARE (*)    All other components within normal limits  VITAMIN B12 - Abnormal; Notable for the following components:   Vitamin B-12 173 (*)    All other components within normal limits  IRON AND TIBC - Abnormal; Notable for the following components:   Iron 9 (*)    Saturation Ratios 3 (*)    All other components within normal limits  FERRITIN - Abnormal; Notable for the following components:   Ferritin 2 (*)    All other components within normal limits  RETICULOCYTES - Abnormal; Notable for the following components:   RBC. 3.51 (*)    All other components within normal limits  D-DIMER, QUANTITATIVE (NOT AT [facility]) - Abnormal; Notable for the following components:   D-Dimer, Quant 0.98 (*)    All other components within normal limits  POC SARS CORONAVIRUS 2 AG -  ED - Abnormal; Notable for the following components:   SARS  Coronavirus 2 Ag POSITIVE (*)    All other components within normal limits  OCCULT BLOOD X 1 CARD TO LAB, STOOL  LACTATE DEHYDROGENASE  FOLATE  C-REACTIVE PROTEIN  FIBRINOGEN  HAPTOGLOBIN  HIV ANTIBODY (ROUTINE TESTING W REFLEX)  I-STAT BETA  HCG BLOOD, ED ([facility], [facility], [facility] ONLY)  TYPE AND SCREEN  PREPARE RBC (CROSSMATCH)  ABO/RH  ABO/RH    DG Skull 1-3 Views  Final Result    DG Abd 1 View  Final Result    DG Chest Portable 1 View  Final Result      Medications  baclofen (LIORESAL) tablet 5-15 mg (has no administration in time range)  zonisamide (ZONEGRAN) capsule 300 mg (has no administration in time range)  cyclobenzaprine (FLEXERIL) tablet 5 mg (has no administration in time range)  albuterol (VENTOLIN HFA) 108 (90 Base) MCG/ACT inhaler 2 puff (has no administration in time range)  ascorbic acid (VITAMIN C) tablet 500 mg (has no administration in time range)  zinc sulfate capsule 220 mg (has no administration in time range)  ondansetron (ZOFRAN) tablet 4 mg (has no administration in time range)    Or  ondansetron (ZOFRAN) injection 4 mg (has no administration in time range)  remdesivir 200 mg in sodium chloride 0.9% 250 mL IVPB (0 mg Intravenous Stopped 05/15/19 1408)    Followed by  remdesivir 100 mg in sodium chloride 0.9 % 100 mL IVPB (has no administration in time range)  ferumoxytol (FERAHEME) 510 mg in sodium chloride 0.9 % 100 mL IVPB (has no administration in time range)  cyanocobalamin ((VITAMIN B-12)) injection 1,000 mcg (has no administration in time range)  folic acid (FOLVITE) tablet 1 mg (has no administration in time range)  sodium chloride 0.9 % bolus 1,000 mL (0 mLs Intravenous Stopped 05/15/19 1326)  0.9 %  sodium chloride infusion (10 mL/hr Intravenous New Bag/Given (Non-Interop) 05/15/19 1326)     Procedures  /  Critical Care .Critical Care Performed by: Sabas Sous, MD Authorized by: Sabas Sous, MD   Critical care provider statement:     Critical care time (minutes):  32   Critical care was necessary to treat or prevent imminent or life-threatening deterioration of the following conditions: Symptomatic anemia requiring blood transfusion.   Critical care was time spent personally by me on the following activities:  Discussions with consultants, evaluation of patient's response to treatment, examination of patient, ordering and performing treatments and interventions, ordering and review of laboratory studies, ordering and review of radiographic studies, pulse oximetry, re-evaluation of patient's condition, obtaining history from patient or surrogate and review of old charts    ED Course and Medical Decision Making  I have reviewed the triage vital signs, the nursing notes, and pertinent available records from the EMR.  Pertinent labs & imaging results that were available during my care of the patient were reviewed by me and considered in my medical decision making (see below for details).     History of CP, here with 4 days of nausea, vomiting, diarrhea, and report of subjective fever, weakness, suspect underlying viral process.  Patient is tachypneic on my exam but otherwise well-appearing.  Will swab for Covid, obtain basic labs, x-ray, urinalysis to exclude UTI, pneumonia, metabolic disarray.  Labs reveal globin of 6.9.  Hemoccult is negative.  Patient endorses heavy periods.  COVID-19 swab is positive.  Admitted to hospitalist for further care.  Elmer Sow. Pilar Plate, MD [facility] Emergency Medicine [facility] [email or web address]  Final Clinical Impressions(s) / ED Diagnoses     ICD-10-CM   1. Symptomatic anemia  D64.9   2. COVID-19  U07.1     ED Discharge Orders    None       Discharge Instructions Discussed with and Provided to  Patient:   Discharge Instructions   None       Sabas Sous, MD 05/15/19 1555

## 2019-05-15 NOTE — ED Triage Notes (Signed)
Transported by [facility] from home-- history of CP and epilepsy. Recent exposure to COVID 19; tested negative on Friday; symptoms started Saturday-- n/v/d, fever, lethargy, and weakness.  EMS administered 4mg  of Zofran PTA along with 500 cc of NS.

## 2019-05-15 NOTE — H&P (Signed)
History and Physical        Hospital Admission Note Date: 05/15/2019  Patient name: Jody [name] Medical record number: [id number] Date of birth: 01-Nov-1995 Age: 24 y.o. Gender: female  PCP: [name], MD    Patient coming from: Home  I have reviewed all records in the [facility].    Chief Complaint:  Nausea, vomiting, diarrhea, fever, lethargy and weakness, symptoms started on Saturday  HPI: Patient is a 24 year old female with history of iron deficiency anemia, menorrhagia, cerebral palsy, epilepsy presented to ED with exposure to COVID-19 at work.  Patient reported that on Saturday, 4 days ago she started having nausea, vomiting and diarrhea, fever of 101 F.  She feels lethargic and weak.  Currently no chest pain, shortness of breath or any palpitations. Lives at home with her mother and grandmother.   COVID-19 test positive on 05/15/2019   ED work-up/course:  In ED temp 98.7, respiratory 26, blood pressure 111/70, O2 sats 98% on room air Sodium 138, potassium 3.3, CO2 19, creatinine 0.5.   WBCs 5.6, hemoglobin 6.9, hematocrit 25.1, platelets 264  Chest x-ray showed no acute abnormality  Review of Systems: Positives marked in 'bold' Constitutional: + fever, chills, diaphoresis, poor appetite and fatigue.  HEENT: Denies photophobia, eye pain, redness, hearing loss, ear pain, congestion, sore throat, rhinorrhea, sneezing, mouth sores, trouble swallowing, neck pain, neck stiffness and tinnitus.   Respiratory: Denies SOB cough, chest tightness,  and wheezing.   Cardiovascular: Denies chest pain, palpitations and leg swelling.  Gastrointestinal: + Nausea vomiting and diarrhea Genitourinary: Denies dysuria, urgency, frequency, hematuria, flank pain and difficulty urinating.  Musculoskeletal: Denies myalgias, back pain, joint swelling, arthralgias and gait  problem.  Skin: Denies pallor, rash and wound.  Neurological: Feel weak and tired Hematological: Denies adenopathy. Easy bruising, personal or family bleeding history  Psychiatric/Behavioral: Denies suicidal ideation, mood changes, confusion, nervousness, sleep disturbance and agitation  Past Medical History: Past Medical History:  Diagnosis Date  . Cerebral palsy (HCC)   . Constipation   . GERD (gastroesophageal reflux disease)   . History of encephalopathy   . Hydradenitis    bilateral  . Hydrocephaly (HCC)   . Migraine   . Neurogenic bladder   . Seizures (HCC)   . Subclinical hypothyroidism 06/04/2018   04/2018  . Vitamin D deficiency 04/03/2017   Vit d 25oh, 12.7 03/2017 - see [facility] labs  . VP (ventriculoperitoneal) shunt status     Past Surgical History:  Procedure Laterality Date  . BOTOX INJECTION    . DENTAL EXAMINATION UNDER ANESTHESIA    . EYE SURGERY    . HAMSTRING LENGTHENING    . shunt replacement  2008   5th shunt for pt.   . TENDON LENGTHENING    . VENTRICULOPERITONEAL SHUNT      Medications: Prior to Admission medications   Medication Sig Start Date End Date Taking? Authorizing Provider  baclofen (LIORESAL) 10 MG tablet Take 5-15 mg by mouth as directed. 5 mg in the morning and 15 mg at night    [provider]  Cholecalciferol (VITAMIN D3 PO) Take 400 Units by mouth daily.    [provider]  clindamycin (CLEOCIN T) 1 % external solution APPLY TOPICALLY TO THE AFFECTED AREA(S) TWICE DAILY 01/22/19   [name], MD  cyclobenzaprine (FLEXERIL) 5 MG tablet TAKE 1 TABLET BY MOUTH THREE TIMES DAILY AS NEEDED FOR MUSCLE SPASMS 02/22/19   [name], MD  ferrous sulfate 325 (65 FE) MG tablet Take 1 tablet by mouth with breakfast every other day. 04/12/18   McDonald, Mia A, PA-C  fluconazole (DIFLUCAN) 100 MG tablet TAKE 2 TABLETS BY MOUTH WEEKLY AS NEEDED FOR YEAST INFECTION 01/22/19   [name], MD  Fluocinolone Acetonide Body 0.01  % OIL APPLY TO THE SKIN 2-3 TIMES DAILY AS DIRECTED 01/22/19   [name], MD  furosemide (LASIX) 20 MG tablet TAKE 1 TABLET BY MOUTH EVERY DAY AS NEEDED FOR EDEMA 02/19/19   [name], MD  ibuprofen (ADVIL) 800 MG tablet TAKE 1 TABLET BY MOUTH EVERY 8 HOURS AS NEEDED FOR MODERATE PAIN 04/22/19   [name], MD  midazolam (VERSED) 10 MG/2ML SOLN injection Draw up 2 mL (10 mg total) and give 1 ml to each nostril or between cheek and gums PRN seizure lasting > 5 minutes. Call 911 after giving 04/04/17   [provider]  Olopatadine HCl 0.2 % SOLN Place 1 drop into both eyes daily as needed. 09/28/18   [name], MD  ondansetron (ZOFRAN-ODT) 4 MG disintegrating tablet Take 1 tablet (4 mg total) by mouth every 8 (eight) hours as needed for nausea or vomiting. 12/03/14   [name], MD  Pediatric Multiple Vit-C-FA (PEDIATRIC MULTIVITAMIN) chewable tablet Chew 2 tablets by mouth daily.    [provider]  potassium chloride (K-DUR) 10 MEQ tablet TAKE 1 TABLET BY MOUTH DAILY AS NEEDED(WITH LASIX) 12/12/18   [name], MD  propranolol (INDERAL) 20 MG tablet TAKE 1 TABLET BY MOUTH TWICE DAILY 02/22/19   [name], MD  rizatriptan (MAXALT) 10 MG tablet Take 10 mg by mouth as needed for migraine. May repeat in 2 hours if needed    [provider]  silver sulfADIAZINE (SILVADENE) 1 % cream Apply 1 application topically daily. 10/24/17   [name], MD  sulfamethoxazole-trimethoprim (BACTRIM DS) 800-160 MG tablet TAKE 1 TABLET BY MOUTH TWICE DAILY 12/12/18   [name], MD  vitamin C (ASCORBIC ACID) 250 MG tablet Take 250 mg by mouth 2 (two) times daily.    [provider]  zonisamide (ZONEGRAN) 100 MG capsule Take 3 capsules (300 mg total) by mouth daily. 04/30/19   [name], MD    Allergies:   Allergies  Allergen Reactions  . Ceftibuten Other (See Comments)    "cant wake her up"  . Fentanyl Hives and Itching  . Tape  Hives    "whelts"  Silk tape  . Doxycycline Other (See Comments) and Nausea Only    unknown unknown     Social History:  reports that she has never smoked. She has never used smokeless tobacco. She reports that she does not drink alcohol or use drugs.  Family History: Family History  Problem Relation Age of Onset  . Diabetes Mother   . Hyperlipidemia Mother   . Hypertension Mother   . Obesity Mother   . Diabetes Brother   . Obesity Brother   . Gastric cancer Maternal Grandfather   . Diabetes Maternal Grandfather     Physical Exam: Blood pressure 113/75, pulse (!) 101, temperature 98.7 F (37.1 C), temperature source Oral,  resp. rate 16, SpO2 100 %. General: Alert, awake, oriented x3, in no acute distress. Eyes: pink conjunctiva,anicteric sclera, pupils equal and reactive to light and accomodation, HEENT: normocephalic, atraumatic, oropharynx clear Neck: supple, no masses or lymphadenopathy, no goiter, no bruits, no JVD CVS: Regular rate and rhythm, without murmurs, rubs or gallops. No lower extremity edema Resp : Clear to auscultation bilaterally, no wheezing, rales or rhonchi. GI : Soft, nontender, nondistended, positive bowel sounds, no masses. No hepatomegaly. No hernia.  Musculoskeletal: No clubbing or cyanosis, positive pedal pulses. No contracture. ROM intact  Neuro: Grossly intact, no focal neurological deficits, strength 5/5 upper and lower extremities bilaterally Psych: alert and oriented x 3, normal mood and affect Skin: no rashes or lesions, warm and dry   LABS on Admission: I have personally reviewed all the labs and imagings below    Basic Metabolic Panel: Recent Labs  Lab 05/15/19 0910  NA 138  K 3.3*  CL 111  CO2 19*  GLUCOSE 94  BUN 7  CREATININE 0.54  CALCIUM 7.6*   Liver Function Tests: Recent Labs  Lab 05/15/19 0910  AST 18  ALT 19  ALKPHOS 42  BILITOT 0.6  PROT 6.1*  ALBUMIN 3.4*   No results for input(s): LIPASE, AMYLASE in the  last 168 hours. No results for input(s): AMMONIA in the last 168 hours. CBC: Recent Labs  Lab 05/15/19 0910  WBC 5.6  HGB 6.9*  HCT 25.1*  MCV 69.9*  PLT 264   Cardiac Enzymes: No results for input(s): CKTOTAL, CKMB, CKMBINDEX, TROPONINI in the last 168 hours. BNP: Invalid input(s): POCBNP CBG: No results for input(s): GLUCAP in the last 168 hours.  Radiological Exams on Admission:  DG Chest Portable 1 View  Result Date: 05/15/2019 CLINICAL DATA:  Shortness of breath EXAM: PORTABLE CHEST 1 VIEW COMPARISON:  11/03/2016 FINDINGS: The heart size and mediastinal contours are within normal limits. Both lungs are clear. The visualized skeletal structures are unremarkable. Shunt catheter tubing projects over the right neck and chest. IMPRESSION: No acute abnormality of the lungs in AP portable projection. Electronically Signed   By: Lauralyn Primes M.D.   On: 05/15/2019 09:30      EKG: Independently reviewed.  None new available    Assessment/Plan Principal Problem: Acute COVID-19 virus infection -Patient presented with weakness, lethargy, nausea vomiting and diarrhea, fevers -Chest x-ray with no infiltrates, no hypoxia or shortness of breath -Started on remdesivir per pharmacy, hold off on steroids as no hypoxia or pneumonia -Follow CRP, D-dimer, fibrinogen.  Ferritin severely low 2.0 likely due to profound iron deficiency anemia -Continue inhalers as needed  Active Problems:    Microcytic anemia, severe iron deficiency anemia -Hemoglobin 6.9, baseline 11.1 on 06/01/2018 -Anemia panel showed iron 9, ferritin 2, percent saturation ratio 3 -will give 1 unit of packed RBC transfusion now, also will give 1 unit of IV Feraheme -Fecal occult blood test negative.  Patient reports no hematochezia or melena -She reports history of menorrhagia.  She had been following with OB/GYN outpatient to regulate her menstrual periods however due to Covid she has not recently followed. -Will need  outpatient follow-up with OB/GYN.  States currently not bleeding   B12 deficiency -B12 173.  Will place on IM B12 while inpatient -Folic acid 6.1   History of subclinical hypothyroidism -Obtain TSH, not on levothyroxine outpatient    Cerebral palsy, diplegic (HCC), history of epilepsy -Currently stable, continue baclofen, Flexeril -Continue zonisamide   DVT prophylaxis: SCDs  CODE STATUS:  Full code  Consults called: None  Family Communication: Admission, patients condition and plan of care including tests being ordered have been discussed with the patient who indicates understanding and agree with the plan and Code Status  Admission status:   The medical decision making on this patient was of high complexity and the patient is at high risk for clinical deterioration, therefore this is a level 3 admission.  Severity of Illness:     The appropriate patient status for this patient is INPATIENT. Inpatient status is judged to be reasonable and necessary in order to provide the required intensity of service to ensure the patient's safety. The patient's presenting symptoms, physical exam findings, and initial radiographic and laboratory data in the context of their chronic comorbidities is felt to place them at high risk for further clinical deterioration. Furthermore, it is not anticipated that the patient will be medically stable for discharge from the hospital within 2 midnights of admission. The following factors support the patient status of inpatient.   " The patient's presenting symptoms include profound anemia, nausea vomiting diarrhea, acute Covid infection. " The worrisome physical exam findings include profound anemia, COVID-19 " The initial radiographic and laboratory data are worrisome because of COVID-19 positive " The chronic co-morbidities include microcytic anemia   * I certify that at the point of admission it is my clinical judgment that the patient will require  inpatient hospital care spanning beyond 2 midnights from the point of admission due to high intensity of service, high risk for further deterioration and high frequency of surveillance required.*    Time Spent on Admission: 60 mins    Tyrese Ficek M.D. [facility] 05/15/2019, 11:41 AM

## 2019-05-16 ENCOUNTER — Other Ambulatory Visit: Payer: Self-pay

## 2019-05-16 ENCOUNTER — Encounter (HOSPITAL_COMMUNITY): Payer: Self-pay | Admitting: Student

## 2019-05-16 DIAGNOSIS — G919 Hydrocephalus, unspecified: Secondary | ICD-10-CM | POA: Diagnosis not present

## 2019-05-16 DIAGNOSIS — Z982 Presence of cerebrospinal fluid drainage device: Secondary | ICD-10-CM

## 2019-05-16 DIAGNOSIS — N920 Excessive and frequent menstruation with regular cycle: Secondary | ICD-10-CM | POA: Diagnosis not present

## 2019-05-16 DIAGNOSIS — D500 Iron deficiency anemia secondary to blood loss (chronic): Secondary | ICD-10-CM | POA: Diagnosis not present

## 2019-05-16 DIAGNOSIS — E039 Hypothyroidism, unspecified: Secondary | ICD-10-CM | POA: Diagnosis not present

## 2019-05-16 DIAGNOSIS — N939 Abnormal uterine and vaginal bleeding, unspecified: Secondary | ICD-10-CM

## 2019-05-16 DIAGNOSIS — D519 Vitamin B12 deficiency anemia, unspecified: Secondary | ICD-10-CM

## 2019-05-16 DIAGNOSIS — U071 COVID-19: Secondary | ICD-10-CM | POA: Diagnosis not present

## 2019-05-16 DIAGNOSIS — A0839 Other viral enteritis: Secondary | ICD-10-CM

## 2019-05-16 DIAGNOSIS — E538 Deficiency of other specified B group vitamins: Secondary | ICD-10-CM | POA: Diagnosis not present

## 2019-05-16 DIAGNOSIS — G801 Spastic diplegic cerebral palsy: Secondary | ICD-10-CM | POA: Diagnosis not present

## 2019-05-16 DIAGNOSIS — G40909 Epilepsy, unspecified, not intractable, without status epilepticus: Secondary | ICD-10-CM | POA: Diagnosis not present

## 2019-05-16 LAB — CBC WITH DIFFERENTIAL/PLATELET
Abs Immature Granulocytes: 0.07 10*3/uL (ref 0.00–0.07)
Basophils Absolute: 0 10*3/uL (ref 0.0–0.1)
Basophils Relative: 0 %
Eosinophils Absolute: 0 10*3/uL (ref 0.0–0.5)
Eosinophils Relative: 0 %
HCT: 32.6 % — ABNORMAL LOW (ref 36.0–46.0)
Hemoglobin: 9.7 g/dL — ABNORMAL LOW (ref 12.0–15.0)
Immature Granulocytes: 1 %
Lymphocytes Relative: 26 %
Lymphs Abs: 1.4 10*3/uL (ref 0.7–4.0)
MCH: 21.4 pg — ABNORMAL LOW (ref 26.0–34.0)
MCHC: 29.8 g/dL — ABNORMAL LOW (ref 30.0–36.0)
MCV: 71.8 fL — ABNORMAL LOW (ref 80.0–100.0)
Monocytes Absolute: 0.7 10*3/uL (ref 0.1–1.0)
Monocytes Relative: 14 %
Neutro Abs: 3.1 10*3/uL (ref 1.7–7.7)
Neutrophils Relative %: 59 %
Platelets: 247 10*3/uL (ref 150–400)
RBC: 4.54 MIL/uL (ref 3.87–5.11)
RDW: 23.5 % — ABNORMAL HIGH (ref 11.5–15.5)
WBC: 5.3 10*3/uL (ref 4.0–10.5)
nRBC: 0.4 % — ABNORMAL HIGH (ref 0.0–0.2)

## 2019-05-16 LAB — COMPREHENSIVE METABOLIC PANEL
ALT: 20 U/L (ref 0–44)
AST: 31 U/L (ref 15–41)
Albumin: 3.6 g/dL (ref 3.5–5.0)
Alkaline Phosphatase: 48 U/L (ref 38–126)
Anion gap: 9 (ref 5–15)
BUN: 8 mg/dL (ref 6–20)
CO2: 22 mmol/L (ref 22–32)
Calcium: 8.6 mg/dL — ABNORMAL LOW (ref 8.9–10.3)
Chloride: 106 mmol/L (ref 98–111)
Creatinine, Ser: 0.76 mg/dL (ref 0.44–1.00)
GFR calc Af Amer: >60 mL/min (ref >60)
GFR calc non Af Amer: >60 mL/min (ref >60)
Glucose, Bld: 83 mg/dL (ref 70–99)
Potassium: 3.9 mmol/L (ref 3.5–5.1)
Sodium: 137 mmol/L (ref 135–145)
Total Bilirubin: 0.6 mg/dL (ref 0.3–1.2)
Total Protein: 6.7 g/dL (ref 6.5–8.1)

## 2019-05-16 LAB — TYPE AND SCREEN
ABO/RH(D): AB POS
Antibody Screen: NEGATIVE
Unit division: 0

## 2019-05-16 LAB — C-REACTIVE PROTEIN: CRP: 0.8 mg/dL (ref <1.0)

## 2019-05-16 LAB — BPAM RBC
Blood Product Expiration Date: 202101302359
ISSUE DATE / TIME: 202101131314
Unit Type and Rh: 6200

## 2019-05-16 LAB — D-DIMER, QUANTITATIVE: D-Dimer, Quant: 0.94 ug/mL-FEU — ABNORMAL HIGH (ref 0.00–0.50)

## 2019-05-16 LAB — FERRITIN: Ferritin: 43 ng/mL (ref 11–307)

## 2019-05-16 LAB — HAPTOGLOBIN: Haptoglobin: 113 mg/dL (ref 33–278)

## 2019-05-16 MED ORDER — LOPERAMIDE HCL 2 MG PO CAPS: 2.0000 mg | ORAL_CAPSULE | ORAL | Status: DC | PRN

## 2019-05-16 MED ORDER — CYANOCOBALAMIN 1000 MCG/ML IJ SOLN
1000.0000 ug | Freq: Every day | INTRAMUSCULAR | Status: DC
  Administered 2019-05-17 – 2019-05-19 (×3): 1000 ug via SUBCUTANEOUS
  Filled 2019-05-16 (×3): qty 1

## 2019-05-16 MED ORDER — VITAMIN B-12 1000 MCG PO TABS
1000.0000 ug | ORAL_TABLET | Freq: Every day | ORAL | Status: DC
  Administered 2019-05-16: 1000 ug via ORAL
  Filled 2019-05-16 (×2): qty 1

## 2019-05-16 MED ORDER — PROPRANOLOL HCL 20 MG PO TABS
20.0000 mg | ORAL_TABLET | Freq: Two times a day (BID) | ORAL | Status: DC
  Administered 2019-05-16 – 2019-05-19 (×7): 20 mg via ORAL
  Filled 2019-05-16 (×9): qty 1

## 2019-05-16 MED ORDER — FERROUS SULFATE 325 (65 FE) MG PO TABS
325.0000 mg | ORAL_TABLET | Freq: Two times a day (BID) | ORAL | Status: DC
  Administered 2019-05-16 – 2019-05-19 (×7): 325 mg via ORAL
  Filled 2019-05-16 (×10): qty 1

## 2019-05-16 MED ORDER — CYANOCOBALAMIN 1000 MCG/ML IJ SOLN
1000.0000 ug | Freq: Every day | INTRAMUSCULAR | Status: DC
  Filled 2019-05-16: qty 1

## 2019-05-16 NOTE — ED Notes (Signed)
Gave report to [facility]link at this time.

## 2019-05-16 NOTE — ED Notes (Signed)
Pt bed changed, brief changed, new pure wick in place and new suction canister placed.

## 2019-05-16 NOTE — ED Notes (Signed)
Pt given sprite and crackers until breakfast tray arrives.

## 2019-05-16 NOTE — Progress Notes (Signed)
RN attempted to call the point of contact, patient's mother, Tamala Julian twice over the last hour at each number listed. No response at this time. Will try to call back later.

## 2019-05-16 NOTE — Plan of Care (Signed)

## 2019-05-16 NOTE — Progress Notes (Signed)
PROGRESS NOTE  Jody [name] BMW:[id number] DOB: 04/07/1996   PCP: [name], MD  Patient is from: Home.  Uses electrical support at baseline.  DOA: 05/15/2019 LOS: 1  Brief Narrative / Interim history: 24 year old female with history of IDA, abnormal uterine bleeding, cerebral palsy, hydrocephalus s/p ventriculo-peritoneal shunt and epilepsy presenting with nausea, vomiting, diarrhea, fever, lethargy and weakness for 4 days..  No cardiopulmonary symptoms.  In ED, COVID-19 positive.  RR 26.  98% on RA.  K3.3.  Bicarb 19.  Hgb 6.9.  CXR without acute finding.  CRP normal.  UA with large Hgb but patient is on period now.  Admitted for symptomatic pneumonia and COVID-19 infection.  Started on remdesivir.  Two units of blood ordered.  Patient received 2 units of blood with appropriate response.  Anemia panel with severe deficiency.  Subjective: No major events overnight of this morning.  Denies chest pain, dyspnea, dizziness, GI or UTI symptoms. She says she is on her period.  Objective: Vitals:   05/16/19 0700 05/16/19 0730 05/16/19 0800 05/16/19 0830  BP: 125/86 106/78 109/80 113/81  Pulse: 96 94 95 (!) 101  Resp: (!) 25 (!) 31 (!) 22 14  Temp:      TempSrc:      SpO2: 98% 94% 100% 97%  Weight:      Height:        Intake/Output Summary (Last 24 hours) at 05/16/2019 0850 Last data filed at 05/15/2019 1803 Gross per 24 hour  Intake 1980 ml  Output --  Net 1980 ml   Filed Weights   05/15/19 1327  Weight: 77.1 kg    Examination:  GENERAL: No acute distress.  Appears well.  HEENT: MMM.  Vision and hearing grossly intact.  NECK: Supple.  No apparent JVD.  RESP:  No IWOB.  Fair aeration bilaterally. CVS:  RRR. Heart sounds normal.  ABD/GI/GU: Bowel sounds present. Soft. Non tender.  MSK/EXT:  Moves extremities.  Lower extremity muscle atrophy is bilaterally. SKIN: no apparent skin lesion or wound NEURO: Awake, alert and oriented appropriately.  No apparent focal neuro  deficit. PSYCH: Calm. Normal affect.  Procedures:  None  Assessment & Plan: Symptomatic iron deficiency anemia in patient with abnormal uterine bleeding: Seems to be a recurrent issue.  Anemia panel consistent with severe iron deficiency and vitamin B12 deficiency.  Hemodynamically stable.  Symptoms resolved after blood transfusion. -Hgb  6.9 (admit)> 2 units> 9.7 -Monitor H&H -Received IV Feraheme 1/13.  Continue ferrous sulfate with bowel regiment -Received vitamin B12 1000 mcg IM.  Change to p.o. vitamin B12 1000 mcg daily. -Would benefit from GYN eval outpatient for abnormal uterine bleeding. -I would discourage NSAIDs.  Gastroenteritis due to COVID-19 infection: GI symptoms resolved.  No respiratory issue.  CXR and CRP within normal. -Continue remdesivir-could complete this outpatient. -Does not qualify for steroids or Actemra.  Vitamin B12 deficiency -S/p vitamin B12 1000 mcg IM -Continue with vitamin B12 1000 mcg p.o.  History of cerebral palsy/hydrocephalus status post ventriculoperitoneal shunt: Shunt series normal. -Supportive care and home medications  History of epilepsy: Stable -Continue home Vimpat  Abnormal urinalysis: With large Hgb likely contamination from menstrual bleeding.                DVT prophylaxis: SCD Code Status: Full code Family Communication: updated patient's mother over the phone Disposition Plan: Remains inpatient to ensure H&H is stable.  She is also on remdesivir for COVID-19 infection. Consultants: None   Microbiology summarized: POC COVID-19 test  positive on 06/01/2019.  Sch Meds:  Scheduled Meds: . vitamin C  500 mg Oral Daily  . baclofen  15 mg Oral QHS  . baclofen  5 mg Oral Daily  . ferrous sulfate  325 mg Oral BID WC  . folic acid  1 mg Oral Daily  . vitamin B-12  1,000 mcg Oral Daily  . zinc sulfate  220 mg Oral Daily  . zonisamide  300 mg Oral Daily   Continuous Infusions: . remdesivir 100 mg in NS 100 mL      PRN Meds:.albuterol, cyclobenzaprine, ondansetron **OR** ondansetron (ZOFRAN) IV  Antimicrobials: Anti-infectives (From admission, onward)   Start     Dose/Rate Route Frequency Ordered Stop   05/16/19 1000  remdesivir 100 mg in sodium chloride 0.9 % 100 mL IVPB     100 mg 200 mL/hr over 30 Minutes Intravenous Daily 05/15/19 1109 05/20/19 0959   05/15/19 1230  remdesivir 200 mg in sodium chloride 0.9% 250 mL IVPB     200 mg 580 mL/hr over 30 Minutes Intravenous Once 05/15/19 1109 05/15/19 1408       I have personally reviewed the following labs and images: CBC: Recent Labs  Lab 05/15/19 0910 05/16/19 0550  WBC 5.6 5.3  NEUTROABS  --  3.1  HGB 6.9* 9.7*  HCT 25.1* 32.6*  MCV 69.9* 71.8*  PLT 264 247   BMP &GFR Recent Labs  Lab 05/15/19 0910 05/16/19 0550  NA 138 137  K 3.3* 3.9  CL 111 106  CO2 19* 22  GLUCOSE 94 83  BUN 7 8  CREATININE 0.54 0.76  CALCIUM 7.6* 8.6*   Estimated Creatinine Clearance: 110 mL/min (by C-G formula based on SCr of 0.76 mg/dL). Liver & Pancreas: Recent Labs  Lab 05/15/19 0910 05/16/19 0550  AST 18 31  ALT 19 20  ALKPHOS 42 48  BILITOT 0.6 0.6  PROT 6.1* 6.7  ALBUMIN 3.4* 3.6   No results for input(s): LIPASE, AMYLASE in the last 168 hours. No results for input(s): AMMONIA in the last 168 hours. Diabetic: No results for input(s): HGBA1C in the last 72 hours. No results for input(s): GLUCAP in the last 168 hours. Cardiac Enzymes: No results for input(s): CKTOTAL, CKMB, CKMBINDEX, TROPONINI in the last 168 hours. No results for input(s): PROBNP in the last 8760 hours. Coagulation Profile: No results for input(s): INR, PROTIME in the last 168 hours. Thyroid Function Tests: No results for input(s): TSH, T4TOTAL, FREET4, T3FREE, THYROIDAB in the last 72 hours. Lipid Profile: No results for input(s): CHOL, HDL, LDLCALC, TRIG, CHOLHDL, LDLDIRECT in the last 72 hours. Anemia Panel: Recent Labs    05/15/19 0910  VITAMINB12  173*  FOLATE 6.1  FERRITIN 2*  TIBC 269  IRON 9*  RETICCTPCT 1.4   Urine analysis:    Component Value Date/Time   COLORURINE YELLOW 05/15/2019 1342   APPEARANCEUR CLEAR 05/15/2019 1342   LABSPEC 1.010 05/15/2019 1342   PHURINE 6.0 05/15/2019 1342   GLUCOSEU NEGATIVE 05/15/2019 1342   HGBUR LARGE (A) 05/15/2019 1342   BILIRUBINUR NEGATIVE 05/15/2019 1342   KETONESUR 20 (A) 05/15/2019 1342   PROTEINUR NEGATIVE 05/15/2019 1342   UROBILINOGEN 1.0 10/01/2014 0042   NITRITE NEGATIVE 05/15/2019 1342   LEUKOCYTESUR NEGATIVE 05/15/2019 1342   Sepsis Labs: Invalid input(s): PROCALCITONIN, LACTICIDVEN  Microbiology: No results found for this or any previous visit (from the past 240 hour(s)).  Radiology Studies: DG Skull 1-3 Views  Result Date: 05/15/2019 CLINICAL DATA:  Shunt evaluation. EXAM:  SKULL - 1-3 VIEW COMPARISON:  11/03/2016 FINDINGS: The ventriculostomy portion of the shunt extends from the occipital region centrally, unchanged when compared to the prior study. The bowel is superimposed on the occipital skull. The cervical portion of the shunt catheter appears intact. IMPRESSION: Superior aspect of the ventriculoperitoneal shunt appears intact and unchanged from prior exam. Electronically Signed   By: Lajean Manes M.D.   On: 05/15/2019 13:06   DG Abd 1 View  Result Date: 05/15/2019 CLINICAL DATA:  Shunt malfunction. EXAM: ABDOMEN - 1 VIEW COMPARISON:  11/03/2016. FINDINGS: And intact shunt catheter overlies the lateral right abdomen, terminating ventral to the sacrum. IMPRESSION: Visualized portion of the shunt catheter appears intact. Electronically Signed   By: Lorin Picket M.D.   On: 05/15/2019 13:03   DG Chest Portable 1 View  Result Date: 05/15/2019 CLINICAL DATA:  Shortness of breath EXAM: PORTABLE CHEST 1 VIEW COMPARISON:  11/03/2016 FINDINGS: The heart size and mediastinal contours are within normal limits. Both lungs are clear. The visualized skeletal structures  are unremarkable. Shunt catheter tubing projects over the right neck and chest. IMPRESSION: No acute abnormality of the lungs in AP portable projection. Electronically Signed   By: Eddie Candle M.D.   On: 05/15/2019 09:30   35 minutes with more than 50% spent in reviewing records, counseling patient/family and coordinating care.   Jahbari Repinski T. [place]  If 7PM-7AM, please contact night-coverage [email or web address] Password [email or web address] 05/16/2019, 8:50 AM

## 2019-05-16 NOTE — Progress Notes (Signed)
24 year old female with history of menorrhagia, cerebral palsy, hydrocephalus-s/p VP shunt, epilepsy-who presented with fever along with nausea/vomiting and diarrhea for approximately 4 days.  In the ED-she was found to have COVID-19 pneumonia and iron deficiency anemia secondary to chronic blood loss due to menorrhagia.  She was transfused 2 units of blood, started on remdesivir-and transferred to [facility] for further evaluation and treatment.  Patient was briefly seen and examined-gastroenteritis symptoms are improving.  Subjective: Lying comfortably in bed  Objective: Blood pressure 102/67, pulse 87, temperature 98.1 F (36.7 C), temperature source Oral, resp. rate 18, height 5\' 4"  (1.626 m), weight 77.1 kg, last menstrual period 05/16/2019, SpO2 97 %.  Assessment and plan: Covid gastroenteritis: Supportive care-continue with remdesivir.  Chronic blood loss anemia with iron deficiency from menorrhagia: Continue iron supplementation-needs outpatient GYN follow-up.  Rest as outlined by Dr. 05/18/2019 had seen the patient earlier at [facility] long hospital..    No charge note

## 2019-05-17 LAB — CBC WITH DIFFERENTIAL/PLATELET
Abs Immature Granulocytes: 0.08 10*3/uL — ABNORMAL HIGH (ref 0.00–0.07)
Basophils Absolute: 0 10*3/uL (ref 0.0–0.1)
Basophils Relative: 0 %
Eosinophils Absolute: 0 10*3/uL (ref 0.0–0.5)
Eosinophils Relative: 0 %
HCT: 32.5 % — ABNORMAL LOW (ref 36.0–46.0)
Hemoglobin: 9.6 g/dL — ABNORMAL LOW (ref 12.0–15.0)
Immature Granulocytes: 2 %
Lymphocytes Relative: 34 %
Lymphs Abs: 1.5 10*3/uL (ref 0.7–4.0)
MCH: 21.1 pg — ABNORMAL LOW (ref 26.0–34.0)
MCHC: 29.5 g/dL — ABNORMAL LOW (ref 30.0–36.0)
MCV: 71.6 fL — ABNORMAL LOW (ref 80.0–100.0)
Monocytes Absolute: 0.4 10*3/uL (ref 0.1–1.0)
Monocytes Relative: 10 %
Neutro Abs: 2.4 10*3/uL (ref 1.7–7.7)
Neutrophils Relative %: 54 %
Platelets: 282 10*3/uL (ref 150–400)
RBC: 4.54 MIL/uL (ref 3.87–5.11)
RDW: 23.9 % — ABNORMAL HIGH (ref 11.5–15.5)
WBC: 4.4 10*3/uL (ref 4.0–10.5)
nRBC: 2.8 % — ABNORMAL HIGH (ref 0.0–0.2)

## 2019-05-17 LAB — COMPREHENSIVE METABOLIC PANEL
ALT: 20 U/L (ref 0–44)
AST: 19 U/L (ref 15–41)
Albumin: 3.5 g/dL (ref 3.5–5.0)
Alkaline Phosphatase: 50 U/L (ref 38–126)
Anion gap: 10 (ref 5–15)
BUN: 6 mg/dL (ref 6–20)
CO2: 23 mmol/L (ref 22–32)
Calcium: 8.6 mg/dL — ABNORMAL LOW (ref 8.9–10.3)
Chloride: 105 mmol/L (ref 98–111)
Creatinine, Ser: 0.62 mg/dL (ref 0.44–1.00)
GFR calc Af Amer: >60 mL/min (ref >60)
GFR calc non Af Amer: >60 mL/min (ref >60)
Glucose, Bld: 111 mg/dL — ABNORMAL HIGH (ref 70–99)
Potassium: 3.2 mmol/L — ABNORMAL LOW (ref 3.5–5.1)
Sodium: 138 mmol/L (ref 135–145)
Total Bilirubin: 0.3 mg/dL (ref 0.3–1.2)
Total Protein: 6.3 g/dL — ABNORMAL LOW (ref 6.5–8.1)

## 2019-05-17 LAB — FERRITIN: Ferritin: 379 ng/mL — ABNORMAL HIGH (ref 11–307)

## 2019-05-17 LAB — C-REACTIVE PROTEIN: CRP: 1.9 mg/dL — ABNORMAL HIGH (ref <1.0)

## 2019-05-17 LAB — D-DIMER, QUANTITATIVE: D-Dimer, Quant: 0.92 ug/mL-FEU — ABNORMAL HIGH (ref 0.00–0.50)

## 2019-05-17 MED ORDER — MEGESTROL ACETATE 40 MG PO TABS
40.0000 mg | ORAL_TABLET | Freq: Two times a day (BID) | ORAL | Status: DC
  Administered 2019-05-17 – 2019-05-18 (×2): 40 mg via ORAL
  Filled 2019-05-17 (×2): qty 1

## 2019-05-17 MED ORDER — POTASSIUM CHLORIDE CRYS ER 20 MEQ PO TBCR
40.0000 meq | EXTENDED_RELEASE_TABLET | ORAL | Status: AC
  Administered 2019-05-17 (×2): 40 meq via ORAL
  Filled 2019-05-17 (×2): qty 2

## 2019-05-17 MED ORDER — ENOXAPARIN SODIUM 40 MG/0.4ML ~~LOC~~ SOLN
40.0000 mg | SUBCUTANEOUS | Status: DC
  Administered 2019-05-17 – 2019-05-18 (×2): 40 mg via SUBCUTANEOUS
  Filled 2019-05-17 (×2): qty 0.4

## 2019-05-17 NOTE — Plan of Care (Signed)

## 2019-05-17 NOTE — Progress Notes (Addendum)
PROGRESS NOTE                                                                                                                                                                                                             Patient Demographics:    Jody [name], is a 24 y.o. female, DOB - Jan 28, 1996, EUM:[id number]  Outpatient Primary MD for the patient is [name], MD   Admit date - 05/15/2019   LOS - 2  Chief Complaint  Patient presents with  . Nausea  . Weakness       Brief Narrative: Patient is a 24 y.o. female with PMHx of menorrhagia, cerebral palsy, hydrocephalus s/p VP shunt, epilepsy who presented with fever, lethargy and nausea/vomiting and diarrhea for 4 days.  Was found to have COVID-19-and admitted to the hospitalist service.   Subjective:    Jody [name] today feels much better-no nausea, vomiting or diarrhea.   Assessment  & Plan :   COVID-19 infection with gastroenteritis: GI symptoms have resolved-continue remdesivir.  Chest x-ray on admission was negative for pneumonia.  Fever: afebrile  O2 requirements:  SpO2: 97 %   COVID-19 Labs: Recent Labs    05/15/19 0910 05/15/19 1146 05/16/19 0550 05/17/19 0111  DDIMER 0.98*  --  0.94* 0.92*  FERRITIN 2*  --  43 379*  LDH 122  --   --   --   CRP  --  0.7 0.8 1.9*    No results found for: BNP  No results for input(s): PROCALCITON in the last 168 hours.  No results found for: SARSCOV2NAA   COVID-19 Medications: Remdesivir:1/13>>  Prone/Incentive Spirometry: encouraged  incentive spirometry use 3-4/hour.  DVT Prophylaxis  :  Lovenox   Iron deficiency anemia due to chronic blood loss from menorrhagia: 2 units of PRBC transfusion-continue iron supplementation.  Spoke with Dr. Heloise Purpura patient's primary GYN MD-recommends using Megace 40 mg twice daily while in the hospital-and to discharge patient on 40 mg p.o. daily and have her  follow-up with Dr. Earlene Plater.   Vitamin B12 deficiency: Continue subcutaneous vitamin B12 supplementation-will require continued supplementation in the outpatient setting.  Hypokalemia: Replete and recheck.  Epilepsy: Stable-continue Zonegran  History of hydrocephalus/cerebral palsy s/p VP shunt: Patient does not ambulate and is wheelchair-bound at baseline.  Consults  :  None  Procedures  :  None  ABG:    Component Value Date/Time   TCO2 17 11/16/2008 2351    Vent Settings: N/A  Condition -Stable  Family Communication  : Left a voicemail for mother  Code Status :  Full Code  Diet :  Diet Order            Diet regular Room service appropriate? Yes; Fluid consistency: Thin  Diet effective now               Disposition Plan  :  Remain hospitalized-tentative discharge  to Home-after she completes a course of remdesivir.  Not a candidate for outpatient remdesivir infusion-nonambulatory-bed to wheelchair bound.  Barriers to discharge: Complete 5 days of remdesivir  Antimicorbials  :    Anti-infectives (From admission, onward)   Start     Dose/Rate Route Frequency Ordered Stop   05/16/19 1000  remdesivir 100 mg in sodium chloride 0.9 % 100 mL IVPB     100 mg 200 mL/hr over 30 Minutes Intravenous Daily 05/15/19 1109 05/20/19 0959   05/15/19 1230  remdesivir 200 mg in sodium chloride 0.9% 250 mL IVPB     200 mg 580 mL/hr over 30 Minutes Intravenous Once 05/15/19 1109 05/15/19 1408      Inpatient Medications  Scheduled Meds: . vitamin C  500 mg Oral Daily  . baclofen  15 mg Oral QHS  . baclofen  5 mg Oral Daily  . cyanocobalamin  1,000 mcg Subcutaneous Daily  . ferrous sulfate  325 mg Oral BID WC  . folic acid  1 mg Oral Daily  . propranolol  20 mg Oral BID  . zinc sulfate  220 mg Oral Daily  . zonisamide  300 mg Oral Daily   Continuous Infusions: . remdesivir 100 mg in NS 100 mL 100 mg (05/17/19 0913)   PRN Meds:.albuterol, cyclobenzaprine, loperamide,  ondansetron **OR** ondansetron (ZOFRAN) IV   Time Spent in minutes  25  See all Orders from today for further details   Oren Binet M.D on 05/17/2019 at 3:32 PM  To page go to [email or web address] - use universal password  [facility] -  Office  [phone]    Objective:   Vitals:   05/16/19 1633 05/16/19 2000 05/17/19 0400 05/17/19 0722  BP: 102/67 109/76 (!) 105/59 102/69  Pulse: 87 82 79 83  Resp: 18 18 18 17   Temp: 98.1 F (36.7 C) 98.4 F (36.9 C) 97.7 F (36.5 C) 98.9 F (37.2 C)  TempSrc: Oral Oral Oral Oral  SpO2: 97% 97% 100% 97%  Weight:      Height:        Wt Readings from Last 3 Encounters:  05/15/19 77.1 kg  06/01/18 77.1 kg  07/26/14 72.6 kg (89 %, Z= 1.21)*   * Growth percentiles are based on CDC (Girls, 2-20 Years) data.     Intake/Output Summary (Last 24 hours) at 05/17/2019 1532 Last data filed at 05/17/2019 0430 Gross per 24 hour  Intake --  Output 620 ml  Net -620 ml     Physical Exam Gen Exam:Alert awake-not in any distress HEENT:atraumatic, normocephalic Chest: B/L clear to auscultation anteriorly CVS:S1S2 regular Abdomen:soft non tender, non distended Extremities:no edema Neurology: moves lower extremities but is chronically weak. Skin: no rash   Data Review:    CBC Recent Labs  Lab 05/15/19 0910 05/16/19 0550 05/17/19 0111  WBC 5.6 5.3 4.4  HGB 6.9* 9.7* 9.6*  HCT 25.1* 32.6* 32.5*  PLT 264 247 282  MCV 69.9* 71.8*  71.6*  MCH 19.2* 21.4* 21.1*  MCHC 27.5* 29.8* 29.5*  RDW 21.2* 23.5* 23.9*  LYMPHSABS  --  1.4 1.5  MONOABS  --  0.7 0.4  EOSABS  --  0.0 0.0  BASOSABS  --  0.0 0.0    Chemistries  Recent Labs  Lab 05/15/19 0910 05/16/19 0550 05/17/19 0111  NA 138 137 138  K 3.3* 3.9 3.2*  CL 111 106 105  CO2 19* 22 23  GLUCOSE 94 83 111*  BUN 7 8 6   CREATININE 0.54 0.76 0.62  CALCIUM 7.6* 8.6* 8.6*  AST 18 31 19   ALT 19 20 20   ALKPHOS 42 48 50  BILITOT 0.6 0.6 0.3    ------------------------------------------------------------------------------------------------------------------ No results for input(s): CHOL, HDL, LDLCALC, TRIG, CHOLHDL, LDLDIRECT in the last 72 hours.  Lab Results  Component Value Date   HGBA1C 5.5 09/01/2017   ------------------------------------------------------------------------------------------------------------------ No results for input(s): TSH, T4TOTAL, T3FREE, THYROIDAB in the last 72 hours.  Invalid input(s): FREET3 ------------------------------------------------------------------------------------------------------------------ Recent Labs    05/15/19 0910 05/15/19 0910 05/16/19 0550 05/17/19 0111  VITAMINB12 173*  --   --   --   FOLATE 6.1  --   --   --   FERRITIN 2*   < > 43 379*  TIBC 269  --   --   --   IRON 9*  --   --   --   RETICCTPCT 1.4  --   --   --    < > = values in this interval not displayed.    Coagulation profile No results for input(s): INR, PROTIME in the last 168 hours.  Recent Labs    05/16/19 0550 05/17/19 0111  DDIMER 0.94* 0.92*    Cardiac Enzymes No results for input(s): CKMB, TROPONINI, MYOGLOBIN in the last 168 hours.  Invalid input(s): CK ------------------------------------------------------------------------------------------------------------------ No results found for: BNP  Micro Results No results found for this or any previous visit (from the past 240 hour(s)).  Radiology Reports DG Skull 1-3 Views  Result Date: 05/15/2019 CLINICAL DATA:  Shunt evaluation. EXAM: SKULL - 1-3 VIEW COMPARISON:  11/03/2016 FINDINGS: The ventriculostomy portion of the shunt extends from the occipital region centrally, unchanged when compared to the prior study. The bowel is superimposed on the occipital skull. The cervical portion of the shunt catheter appears intact. IMPRESSION: Superior aspect of the ventriculoperitoneal shunt appears intact and unchanged from prior exam.  Electronically Signed   By: 05/19/19 M.D.   On: 05/15/2019 13:06   DG Abd 1 View  Result Date: 05/15/2019 CLINICAL DATA:  Shunt malfunction. EXAM: ABDOMEN - 1 VIEW COMPARISON:  11/03/2016. FINDINGS: And intact shunt catheter overlies the lateral right abdomen, terminating ventral to the sacrum. IMPRESSION: Visualized portion of the shunt catheter appears intact. Electronically Signed   By: 05/17/2019 M.D.   On: 05/15/2019 13:03   DG Chest Portable 1 View  Result Date: 05/15/2019 CLINICAL DATA:  Shortness of breath EXAM: PORTABLE CHEST 1 VIEW COMPARISON:  11/03/2016 FINDINGS: The heart size and mediastinal contours are within normal limits. Both lungs are clear. The visualized skeletal structures are unremarkable. Shunt catheter tubing projects over the right neck and chest. IMPRESSION: No acute abnormality of the lungs in AP portable projection. Electronically Signed   By: 05/17/2019 M.D.   On: 05/15/2019 09:30

## 2019-05-18 LAB — CBC WITH DIFFERENTIAL/PLATELET
Abs Immature Granulocytes: 0.35 10*3/uL — ABNORMAL HIGH (ref 0.00–0.07)
Basophils Absolute: 0.1 10*3/uL (ref 0.0–0.1)
Basophils Relative: 1 %
Eosinophils Absolute: 0 10*3/uL (ref 0.0–0.5)
Eosinophils Relative: 0 %
HCT: 33 % — ABNORMAL LOW (ref 36.0–46.0)
Hemoglobin: 9.6 g/dL — ABNORMAL LOW (ref 12.0–15.0)
Immature Granulocytes: 6 %
Lymphocytes Relative: 37 %
Lymphs Abs: 2.2 10*3/uL (ref 0.7–4.0)
MCH: 21.3 pg — ABNORMAL LOW (ref 26.0–34.0)
MCHC: 29.1 g/dL — ABNORMAL LOW (ref 30.0–36.0)
MCV: 73.2 fL — ABNORMAL LOW (ref 80.0–100.0)
Monocytes Absolute: 0.5 10*3/uL (ref 0.1–1.0)
Monocytes Relative: 8 %
Neutro Abs: 2.9 10*3/uL (ref 1.7–7.7)
Neutrophils Relative %: 48 %
Platelets: 289 10*3/uL (ref 150–400)
RBC: 4.51 MIL/uL (ref 3.87–5.11)
RDW: 25.2 % — ABNORMAL HIGH (ref 11.5–15.5)
WBC: 6 10*3/uL (ref 4.0–10.5)
nRBC: 4.5 % — ABNORMAL HIGH (ref 0.0–0.2)

## 2019-05-18 LAB — COMPREHENSIVE METABOLIC PANEL
ALT: 21 U/L (ref 0–44)
AST: 23 U/L (ref 15–41)
Albumin: 3.4 g/dL — ABNORMAL LOW (ref 3.5–5.0)
Alkaline Phosphatase: 54 U/L (ref 38–126)
Anion gap: 8 (ref 5–15)
BUN: 10 mg/dL (ref 6–20)
CO2: 21 mmol/L — ABNORMAL LOW (ref 22–32)
Calcium: 8.6 mg/dL — ABNORMAL LOW (ref 8.9–10.3)
Chloride: 110 mmol/L (ref 98–111)
Creatinine, Ser: 0.6 mg/dL (ref 0.44–1.00)
GFR calc Af Amer: >60 mL/min (ref >60)
GFR calc non Af Amer: >60 mL/min (ref >60)
Glucose, Bld: 80 mg/dL (ref 70–99)
Potassium: 4.2 mmol/L (ref 3.5–5.1)
Sodium: 139 mmol/L (ref 135–145)
Total Bilirubin: 0.3 mg/dL (ref 0.3–1.2)
Total Protein: 6.1 g/dL — ABNORMAL LOW (ref 6.5–8.1)

## 2019-05-18 LAB — D-DIMER, QUANTITATIVE: D-Dimer, Quant: 0.48 ug/mL-FEU (ref 0.00–0.50)

## 2019-05-18 LAB — FERRITIN: Ferritin: 1285 ng/mL — ABNORMAL HIGH (ref 11–307)

## 2019-05-18 LAB — C-REACTIVE PROTEIN: CRP: 3.1 mg/dL — ABNORMAL HIGH (ref <1.0)

## 2019-05-18 MED ORDER — MEGESTROL ACETATE 40 MG PO TABS
40.0000 mg | ORAL_TABLET | Freq: Every day | ORAL | Status: DC
  Administered 2019-05-19: 40 mg via ORAL
  Filled 2019-05-18: qty 1

## 2019-05-18 NOTE — Progress Notes (Signed)
Attempted to call Pt's mother x2 to update on pt status. No answer.

## 2019-05-18 NOTE — Progress Notes (Signed)
Mother updated

## 2019-05-18 NOTE — Progress Notes (Signed)
PROGRESS NOTE                                                                                                                                                                                                             Patient Demographics:    Jody [name], is a 24 y.o. female, DOB - 21-Dec-1995, VZC:[id number]  Outpatient Primary MD for the patient is [name], MD   Admit date - 05/15/2019   LOS - 3  Chief Complaint  Patient presents with  . Nausea  . Weakness       Brief Narrative: Patient is a 24 y.o. female with PMHx of menorrhagia, cerebral palsy, hydrocephalus s/p VP shunt, epilepsy who presented with fever, lethargy and nausea/vomiting and diarrhea for 4 days.  Was found to have COVID-19-and admitted to the hospitalist service.   Subjective:   No diarrhea or vomiting.  Claims some menstrual bleeding has slowed down significantly.   Assessment  & Plan :   COVID-19 infection with gastroenteritis: GI symptoms have resolved-continue remdesivir.  Chest x-ray on admission was negative for pneumonia.  Fever: afebrile  O2 requirements:  SpO2: 100 %   COVID-19 Labs: Recent Labs    05/16/19 0550 05/17/19 0111 05/18/19 0331  DDIMER 0.94* 0.92* 0.48  FERRITIN 43 379* 1,285*  CRP 0.8 1.9* 3.1*    No results found for: BNP  No results for input(s): PROCALCITON in the last 168 hours.  No results found for: SARSCOV2NAA   COVID-19 Medications: Remdesivir:1/13>>  Prone/Incentive Spirometry: encouraged  incentive spirometry use 3-4/hour.  DVT Prophylaxis  :  Lovenox   Iron deficiency anemia due to chronic blood loss from menorrhagia: Required 20s of PRBC transfusion on admission-hemoglobin stable.  Continue iron supplementation on discharge.  Spoke with Dr. Heloise Purpura patient's primary GYN MD-recommends using Megace 40 mg twice daily while in the hospital-and to discharge patient on 40 mg p.o.  daily and have her follow-up with Dr. Earlene Plater.   Vitamin B12 deficiency: Continue subcutaneous vitamin B12 supplementation-will require continued supplementation in the outpatient setting.  Hypokalemia: Replete and recheck.  Epilepsy: Stable-continue Zonegran  History of hydrocephalus/cerebral palsy s/p VP shunt: Patient does not ambulate and is wheelchair-bound at baseline.  Consults  :  None  Procedures  :  None  ABG:    Component Value Date/Time   TCO2 17 11/16/2008  2351    Vent Settings: N/A  Condition -Stable  Family Communication  : Left a voicemail for mother 1/16  Code Status :  Full Code  Diet :  Diet Order            Diet regular Room service appropriate? Yes; Fluid consistency: Thin  Diet effective now               Disposition Plan  :  Remain hospitalized-tentative discharge  to Home-after she completes a course of remdesivir.  Not a candidate for outpatient remdesivir infusion-nonambulatory-bed to wheelchair bound.  Barriers to discharge: Complete 5 days of remdesivir  Antimicorbials  :    Anti-infectives (From admission, onward)   Start     Dose/Rate Route Frequency Ordered Stop   05/16/19 1000  remdesivir 100 mg in sodium chloride 0.9 % 100 mL IVPB     100 mg 200 mL/hr over 30 Minutes Intravenous Daily 05/15/19 1109 05/20/19 0959   05/15/19 1230  remdesivir 200 mg in sodium chloride 0.9% 250 mL IVPB     200 mg 580 mL/hr over 30 Minutes Intravenous Once 05/15/19 1109 05/15/19 1408      Inpatient Medications  Scheduled Meds: . vitamin C  500 mg Oral Daily  . baclofen  15 mg Oral QHS  . baclofen  5 mg Oral Daily  . cyanocobalamin  1,000 mcg Subcutaneous Daily  . enoxaparin (LOVENOX) injection  40 mg Subcutaneous Q24H  . ferrous sulfate  325 mg Oral BID WC  . folic acid  1 mg Oral Daily  . [START ON 05/19/2019] megestrol  40 mg Oral Daily  . propranolol  20 mg Oral BID  . zinc sulfate  220 mg Oral Daily  . zonisamide  300 mg Oral Daily    Continuous Infusions: . remdesivir 100 mg in NS 100 mL 100 mg (05/18/19 0924)   PRN Meds:.albuterol, cyclobenzaprine, loperamide, ondansetron **OR** ondansetron (ZOFRAN) IV   Time Spent in minutes  25  See all Orders from today for further details   Oren Binet M.D on 05/18/2019 at 1:56 PM  To page go to [email or web address] - use universal password  [facility] -  Office  [phone]    Objective:   Vitals:   05/17/19 1926 05/18/19 0400 05/18/19 0743 05/18/19 0918  BP: 101/80 107/66 102/81 114/81  Pulse: 84 80 83 89  Resp: (!) 22 14 16    Temp: 98.4 F (36.9 C) 97.9 F (36.6 C) 98.2 F (36.8 C)   TempSrc: Oral Oral Oral   SpO2: 98% 98% 100%   Weight:      Height:        Wt Readings from Last 3 Encounters:  05/15/19 77.1 kg  06/01/18 77.1 kg  07/26/14 72.6 kg (89 %, Z= 1.21)*   * Growth percentiles are based on CDC (Girls, 2-20 Years) data.     Intake/Output Summary (Last 24 hours) at 05/18/2019 1356 Last data filed at 05/18/2019 0800 Gross per 24 hour  Intake 240 ml  Output 750 ml  Net -510 ml     Physical Exam Gen Exam:Alert awake-not in any distress HEENT:atraumatic, normocephalic Chest: B/L clear to auscultation anteriorly CVS:S1S2 regular Abdomen:soft non tender, non distended Extremities:no edema Neurology: Non focal Skin: no rash   Data Review:    CBC Recent Labs  Lab 05/15/19 0910 05/16/19 0550 05/17/19 0111 05/18/19 0331  WBC 5.6 5.3 4.4 6.0  HGB 6.9* 9.7* 9.6* 9.6*  HCT 25.1* 32.6* 32.5* 33.0*  PLT 264  247 282 289  MCV 69.9* 71.8* 71.6* 73.2*  MCH 19.2* 21.4* 21.1* 21.3*  MCHC 27.5* 29.8* 29.5* 29.1*  RDW 21.2* 23.5* 23.9* 25.2*  LYMPHSABS  --  1.4 1.5 2.2  MONOABS  --  0.7 0.4 0.5  EOSABS  --  0.0 0.0 0.0  BASOSABS  --  0.0 0.0 0.1    Chemistries  Recent Labs  Lab 05/15/19 0910 05/16/19 0550 05/17/19 0111 05/18/19 0331  NA 138 137 138 139  K 3.3* 3.9 3.2* 4.2  CL 111 106 105 110  CO2 19* 22 23 21*   GLUCOSE 94 83 111* 80  BUN 7 8 6 10   CREATININE 0.54 0.76 0.62 0.60  CALCIUM 7.6* 8.6* 8.6* 8.6*  AST 18 31 19 23   ALT 19 20 20 21   ALKPHOS 42 48 50 54  BILITOT 0.6 0.6 0.3 0.3   ------------------------------------------------------------------------------------------------------------------ No results for input(s): CHOL, HDL, LDLCALC, TRIG, CHOLHDL, LDLDIRECT in the last 72 hours.  Lab Results  Component Value Date   HGBA1C 5.5 09/01/2017   ------------------------------------------------------------------------------------------------------------------ No results for input(s): TSH, T4TOTAL, T3FREE, THYROIDAB in the last 72 hours.  Invalid input(s): FREET3 ------------------------------------------------------------------------------------------------------------------ Recent Labs    05/17/19 0111 05/18/19 0331  FERRITIN 379* 1,285*    Coagulation profile No results for input(s): INR, PROTIME in the last 168 hours.  Recent Labs    05/17/19 0111 05/18/19 0331  DDIMER 0.92* 0.48    Cardiac Enzymes No results for input(s): CKMB, TROPONINI, MYOGLOBIN in the last 168 hours.  Invalid input(s): CK ------------------------------------------------------------------------------------------------------------------ No results found for: BNP  Micro Results No results found for this or any previous visit (from the past 240 hour(s)).  Radiology Reports DG Skull 1-3 Views  Result Date: 05/15/2019 CLINICAL DATA:  Shunt evaluation. EXAM: SKULL - 1-3 VIEW COMPARISON:  11/03/2016 FINDINGS: The ventriculostomy portion of the shunt extends from the occipital region centrally, unchanged when compared to the prior study. The bowel is superimposed on the occipital skull. The cervical portion of the shunt catheter appears intact. IMPRESSION: Superior aspect of the ventriculoperitoneal shunt appears intact and unchanged from prior exam. Electronically Signed   By: 05/20/19 M.D.   On:  05/15/2019 13:06   DG Abd 1 View  Result Date: 05/15/2019 CLINICAL DATA:  Shunt malfunction. EXAM: ABDOMEN - 1 VIEW COMPARISON:  11/03/2016. FINDINGS: And intact shunt catheter overlies the lateral right abdomen, terminating ventral to the sacrum. IMPRESSION: Visualized portion of the shunt catheter appears intact. Electronically Signed   By: 05/17/2019 M.D.   On: 05/15/2019 13:03   DG Chest Portable 1 View  Result Date: 05/15/2019 CLINICAL DATA:  Shortness of breath EXAM: PORTABLE CHEST 1 VIEW COMPARISON:  11/03/2016 FINDINGS: The heart size and mediastinal contours are within normal limits. Both lungs are clear. The visualized skeletal structures are unremarkable. Shunt catheter tubing projects over the right neck and chest. IMPRESSION: No acute abnormality of the lungs in AP portable projection. Electronically Signed   By: 05/17/2019 M.D.   On: 05/15/2019 09:30

## 2019-05-18 NOTE — Plan of Care (Signed)
   Vital Signs MEWS/VS Documentation       05/18/2019 0745 05/18/2019 0918 05/18/2019 1551 05/18/2019 1951   MEWS Score:  0  0  1  0   MEWS Score Color:  Green  Green  Green  Green   Resp:  --  --  20  --   Pulse:  --  89  93  87   BP:  --  114/81  (!) 100/57  102/67   Temp:  --  --  98.6 F (37 C)  98.1 F (36.7 C)   O2 Device:  --  --  Room [employer]   Level of Consciousness:  Alert  --  Alert  --       POC reviewed with pt.      Darcus Pester [place] Newmann 05/18/2019,10:47 PM

## 2019-05-19 LAB — CBC WITH DIFFERENTIAL/PLATELET
Abs Immature Granulocytes: 0.49 10*3/uL — ABNORMAL HIGH (ref 0.00–0.07)
Basophils Absolute: 0.1 10*3/uL (ref 0.0–0.1)
Basophils Relative: 1 %
Eosinophils Absolute: 0 10*3/uL (ref 0.0–0.5)
Eosinophils Relative: 1 %
HCT: 34.1 % — ABNORMAL LOW (ref 36.0–46.0)
Hemoglobin: 10.1 g/dL — ABNORMAL LOW (ref 12.0–15.0)
Immature Granulocytes: 6 %
Lymphocytes Relative: 23 %
Lymphs Abs: 1.8 10*3/uL (ref 0.7–4.0)
MCH: 21.6 pg — ABNORMAL LOW (ref 26.0–34.0)
MCHC: 29.6 g/dL — ABNORMAL LOW (ref 30.0–36.0)
MCV: 73 fL — ABNORMAL LOW (ref 80.0–100.0)
Monocytes Absolute: 0.6 10*3/uL (ref 0.1–1.0)
Monocytes Relative: 8 %
Neutro Abs: 4.9 10*3/uL (ref 1.7–7.7)
Neutrophils Relative %: 61 %
Platelets: 309 10*3/uL (ref 150–400)
RBC: 4.67 MIL/uL (ref 3.87–5.11)
RDW: 26.6 % — ABNORMAL HIGH (ref 11.5–15.5)
WBC: 8 10*3/uL (ref 4.0–10.5)
nRBC: 2.3 % — ABNORMAL HIGH (ref 0.0–0.2)

## 2019-05-19 LAB — COMPREHENSIVE METABOLIC PANEL
ALT: 22 U/L (ref 0–44)
AST: 21 U/L (ref 15–41)
Albumin: 3.2 g/dL — ABNORMAL LOW (ref 3.5–5.0)
Alkaline Phosphatase: 54 U/L (ref 38–126)
Anion gap: 10 (ref 5–15)
BUN: 9 mg/dL (ref 6–20)
CO2: 20 mmol/L — ABNORMAL LOW (ref 22–32)
Calcium: 8.5 mg/dL — ABNORMAL LOW (ref 8.9–10.3)
Chloride: 107 mmol/L (ref 98–111)
Creatinine, Ser: 0.59 mg/dL (ref 0.44–1.00)
GFR calc Af Amer: >60 mL/min (ref >60)
GFR calc non Af Amer: >60 mL/min (ref >60)
Glucose, Bld: 81 mg/dL (ref 70–99)
Potassium: 4 mmol/L (ref 3.5–5.1)
Sodium: 137 mmol/L (ref 135–145)
Total Bilirubin: 0.7 mg/dL (ref 0.3–1.2)
Total Protein: 6 g/dL — ABNORMAL LOW (ref 6.5–8.1)

## 2019-05-19 LAB — D-DIMER, QUANTITATIVE: D-Dimer, Quant: 0.36 ug/mL-FEU (ref 0.00–0.50)

## 2019-05-19 LAB — C-REACTIVE PROTEIN: CRP: 2.4 mg/dL — ABNORMAL HIGH (ref <1.0)

## 2019-05-19 LAB — FERRITIN: Ferritin: 1118 ng/mL — ABNORMAL HIGH (ref 11–307)

## 2019-05-19 MED ORDER — FERROUS SULFATE 325 (65 FE) MG PO TABS: 325.0000 mg | ORAL_TABLET | Freq: Two times a day (BID) | ORAL | 0 refills | Status: AC

## 2019-05-19 MED ORDER — MEGESTROL ACETATE 40 MG PO TABS: 40.0000 mg | ORAL_TABLET | Freq: Every day | ORAL | 0 refills | Status: DC

## 2019-05-19 NOTE — Progress Notes (Signed)
Attempted to call Pts mom about d/c coordination x2. Confirmed moms number w/ Pt [phone]) & called a third time.   Left voicemail w/ return number [phone]  Asked to notify her mom that nurse would like to speak w/ her & asked to call back  Berneice Heinrich

## 2019-05-19 NOTE — Plan of Care (Signed)
[name]&Ox4. VSS, SpO2 >88% on RA. No c/o pain throughout shift. Utilizing purewick for urine output. Had BM during shift  Reviewed d/c paperwork w/ pt mom. Stated understanding & all questions answered. Reviewed CDC form, signed & placed in pt chart.   PIVs removed  Pt brought to wheelchair van in home wheelchair. Left hospital w/ all belongings   Jody [name]    Problem: Education: Goal: Knowledge of risk factors and measures for prevention of condition will improve Outcome: Adequate for Discharge   Problem: Coping: Goal: Psychosocial and spiritual needs will be supported Outcome: Adequate for Discharge   Problem: Respiratory: Goal: Will maintain a patent airway Outcome: Adequate for Discharge Goal: Complications related to the disease process, condition or treatment will be avoided or minimized Outcome: Adequate for Discharge   Problem: Education: Goal: Knowledge of General Education information will improve Description: Including pain rating scale, medication(s)/side effects and non-pharmacologic comfort measures Outcome: Adequate for Discharge   Problem: Health Behavior/Discharge Planning: Goal: Ability to manage health-related needs will improve Outcome: Adequate for Discharge   Problem: Clinical Measurements: Goal: Ability to maintain clinical measurements within normal limits will improve Outcome: Adequate for Discharge Goal: Will remain free from infection Outcome: Adequate for Discharge Goal: Diagnostic test results will improve Outcome: Adequate for Discharge Goal: Respiratory complications will improve Outcome: Adequate for Discharge Goal: Cardiovascular complication will be avoided Outcome: Adequate for Discharge   Problem: Activity: Goal: Risk for activity intolerance will decrease Outcome: Adequate for Discharge   Problem: Nutrition: Goal: Adequate nutrition will be maintained Outcome: Adequate for Discharge   Problem: Coping: Goal: Level of  anxiety will decrease Outcome: Adequate for Discharge   Problem: Elimination: Goal: Will not experience complications related to bowel motility Outcome: Adequate for Discharge Goal: Will not experience complications related to urinary retention Outcome: Adequate for Discharge   Problem: Pain Managment: Goal: General experience of comfort will improve Outcome: Adequate for Discharge   Problem: Safety: Goal: Ability to remain free from injury will improve Outcome: Adequate for Discharge   Problem: Skin Integrity: Goal: Risk for impaired skin integrity will decrease Outcome: Adequate for Discharge

## 2019-05-19 NOTE — Discharge Instructions (Signed)
Follow with Primary MD [name], MD and your primary OB/GYN physician in 7 days   Get CBC, CMP, 2 view Chest X ray -  checked next visit within 1 week by Primary MD   Activity: As tolerated with Full fall precautions use walker/cane & assistance as needed  Disposition Home    Diet: Heart Healthy    Special Instructions: If you have smoked or chewed Tobacco  in the last 2 yrs please stop smoking, stop any regular Alcohol  and or any Recreational drug use.  On your next visit with your primary care physician please Get Medicines reviewed and adjusted.  Please request your Prim.MD to go over all Hospital Tests and Procedure/Radiological results at the follow up, please get all Hospital records sent to your Prim MD by signing hospital release before you go home.  If you experience worsening of your admission symptoms, develop shortness of breath, life threatening emergency, suicidal or homicidal thoughts you must seek medical attention immediately by calling 911 or calling your MD immediately  if symptoms less severe.  You Must read complete instructions/literature along with all the possible adverse reactions/side effects for all the Medicines you take and that have been prescribed to you. Take any new Medicines after you have completely understood and accpet all the possible adverse reactions/side effects.         Person Under Monitoring Name: Jody [name]  Location: [address] [place] [address]   Infection Prevention Recommendations for Individuals Confirmed to have, or Being Evaluated for, 2019 Novel Coronavirus (COVID-19) Infection Who Receive Care at Home  Individuals who are confirmed to have, or are being evaluated for, COVID-19 should follow the prevention steps below until a healthcare provider or local or state health department says they can return to normal activities.  Stay home except to get medical care You should restrict activities outside your  home, except for getting medical care. Do not go to work, school, or public areas, and do not use public transportation or taxis.  Call ahead before visiting your doctor Before your medical appointment, call the healthcare provider and tell them that you have, or are being evaluated for, COVID-19 infection. This will help the healthcare providers office take steps to keep other people from getting infected. Ask your healthcare provider to call the local or state health department.  Monitor your symptoms Seek prompt medical attention if your illness is worsening (e.g., difficulty breathing). Before going to your medical appointment, call the healthcare provider and tell them that you have, or are being evaluated for, COVID-19 infection. Ask your healthcare provider to call the local or state health department.  Wear a facemask You should wear a facemask that covers your nose and mouth when you are in the same room with other people and when you visit a healthcare provider. People who live with or visit you should also wear a facemask while they are in the same room with you.  Separate yourself from other people in your home As much as possible, you should stay in a different room from other people in your home. Also, you should use a separate bathroom, if available.  Avoid sharing household items You should not share dishes, drinking glasses, cups, eating utensils, towels, bedding, or other items with other people in your home. After using these items, you should wash them thoroughly with soap and water.  Cover your coughs and sneezes Cover your mouth and nose with a tissue when you cough or  sneeze, or you can cough or sneeze into your sleeve. Throw used tissues in a lined trash can, and immediately wash your hands with soap and water for at least 20 seconds or use an alcohol-based hand rub.  Wash your [facility] your hands often and thoroughly with soap and water for at least 20  seconds. You can use an alcohol-based hand sanitizer if soap and water are not available and if your hands are not visibly dirty. Avoid touching your eyes, nose, and mouth with unwashed hands.   Prevention Steps for Caregivers and Household Members of Individuals Confirmed to have, or Being Evaluated for, COVID-19 Infection Being Cared for in the Home  If you live with, or provide care at home for, a person confirmed to have, or being evaluated for, COVID-19 infection please follow these guidelines to prevent infection:  Follow healthcare providers instructions Make sure that you understand and can help the patient follow any healthcare provider instructions for all care.  Provide for the patients basic needs You should help the patient with basic needs in the home and provide support for getting groceries, prescriptions, and other personal needs.  Monitor the patients symptoms If they are getting sicker, call his or her medical provider and tell them that the patient has, or is being evaluated for, COVID-19 infection. This will help the healthcare providers office take steps to keep other people from getting infected. Ask the healthcare provider to call the local or state health department.  Limit the number of people who have contact with the patient  If possible, have only one caregiver for the patient.  Other household members should stay in another home or place of residence. If this is not possible, they should stay  in another room, or be separated from the patient as much as possible. Use a separate bathroom, if available.  Restrict visitors who do not have an essential need to be in the home.  Keep older adults, very young children, and other sick people away from the patient Keep older adults, very young children, and those who have compromised immune systems or chronic health conditions away from the patient. This includes people with chronic heart, lung, or kidney  conditions, diabetes, and cancer.  Ensure good ventilation Make sure that shared spaces in the home have good air flow, such as from an air conditioner or an opened window, weather permitting.  Wash your hands often  Wash your hands often and thoroughly with soap and water for at least 20 seconds. You can use an alcohol based hand sanitizer if soap and water are not available and if your hands are not visibly dirty.  Avoid touching your eyes, nose, and mouth with unwashed hands.  Use disposable paper towels to dry your hands. If not available, use dedicated cloth towels and replace them when they become wet.  Wear a facemask and gloves  Wear a disposable facemask at all times in the room and gloves when you touch or have contact with the patients blood, body fluids, and/or secretions or excretions, such as sweat, saliva, sputum, nasal mucus, vomit, urine, or feces.  Ensure the mask fits over your nose and mouth tightly, and do not touch it during use.  Throw out disposable facemasks and gloves after using them. Do not reuse.  Wash your hands immediately after removing your facemask and gloves.  If your personal clothing becomes contaminated, carefully remove clothing and launder. Wash your hands after handling contaminated clothing.  Place all used  disposable facemasks, gloves, and other waste in a lined container before disposing them with other household waste.  Remove gloves and wash your hands immediately after handling these items.  Do not share dishes, glasses, or other household items with the patient  Avoid sharing household items. You should not share dishes, drinking glasses, cups, eating utensils, towels, bedding, or other items with a patient who is confirmed to have, or being evaluated for, COVID-19 infection.  After the person uses these items, you should wash them thoroughly with soap and water.  Wash laundry thoroughly  Immediately remove and wash clothes or  bedding that have blood, body fluids, and/or secretions or excretions, such as sweat, saliva, sputum, nasal mucus, vomit, urine, or feces, on them.  Wear gloves when handling laundry from the patient.  Read and follow directions on labels of laundry or clothing items and detergent. In general, wash and dry with the warmest temperatures recommended on the label.  Clean all areas the individual has used often  Clean all touchable surfaces, such as counters, tabletops, doorknobs, bathroom fixtures, toilets, phones, keyboards, tablets, and bedside tables, every day. Also, clean any surfaces that may have blood, body fluids, and/or secretions or excretions on them.  Wear gloves when cleaning surfaces the patient has come in contact with.  Use a diluted bleach solution (e.g., dilute bleach with 1 part bleach and 10 parts water) or a household disinfectant with a label that says EPA-registered for coronaviruses. To make a bleach solution at home, add 1 tablespoon of bleach to 1 quart (4 cups) of water. For a larger supply, add  cup of bleach to 1 gallon (16 cups) of water.  Read labels of cleaning products and follow recommendations provided on product labels. Labels contain instructions for safe and effective use of the cleaning product including precautions you should take when applying the product, such as wearing gloves or eye protection and making sure you have good ventilation during use of the product.  Remove gloves and wash hands immediately after cleaning.  Monitor yourself for signs and symptoms of illness Caregivers and household members are considered close contacts, should monitor their health, and will be asked to limit movement outside of the home to the extent possible. Follow the monitoring steps for close contacts listed on the symptom monitoring form.   ? If you have additional questions, contact your local health department or call the epidemiologist on call at  [phone] (available 24/7). ? This guidance is subject to change. For the most up-to-date guidance from [facility], please refer to their website: [email or web address].pl

## 2019-05-19 NOTE — Discharge Summary (Signed)
Jody [name] FEO:[id number] DOB: 01-08-1996 DOA: 05/15/2019  PCP: Leamon Arnt, MD  Admit date: 05/15/2019  Discharge date: 05/19/2019  Admitted From: Home   Disposition:  Home   Recommendations for Outpatient Follow-up:   Follow up with PCP in 1-2 weeks  PCP Please obtain BMP/CBC, 2 view CXR in 1week,  (see Discharge instructions)   PCP Please follow up on the following pending results:    Home Health: None   Equipment/Devices: None  Consultations: None  Discharge Condition: Stable    CODE STATUS: Full    Diet Recommendation: Heart Healthy   Diet Order            Diet - low sodium heart healthy        Diet regular Room service appropriate? Yes; Fluid consistency: Thin  Diet effective now               Chief Complaint  Patient presents with  . Nausea  . Weakness     Brief history of present illness from the day of admission and additional interim summary    Patient is a 24 y.o. female with PMHx of menorrhagia, cerebral palsy, hydrocephalus s/p VP shunt, epilepsy who presented with fever, lethargy and nausea/vomiting and diarrhea for 4 days.  Was found to have COVID-19-and admitted to the hospitalist service.                                                                 Hospital Course   COVID-19 infection with gastroenteritis: No pulmonary symptoms, GI symptoms have resolved, she has finished remdesivir course and will be discharged back home.   SpO2: 100 %  Recent Labs  Lab 05/15/19 0910 05/15/19 1146 05/16/19 0550 05/17/19 0111 05/18/19 0331 05/19/19 0315  CRP  --  0.7 0.8 1.9* 3.1* 2.4*  DDIMER 0.98*  --  0.94* 0.92* 0.48 0.36  FERRITIN 2*  --  43 379* 1,285* 1,118*    Hepatic Function Latest Ref Rng & Units 05/19/2019 05/18/2019 05/17/2019  Total Protein 6.5 - 8.1 g/dL  6.0(L) 6.1(L) 6.3(L)  Albumin 3.5 - 5.0 g/dL 3.2(L) 3.4(L) 3.5  AST 15 - 41 U/L '21 23 19  ' ALT 0 - 44 U/L '22 21 20  ' Alk Phosphatase 38 - 126 U/L 54 54 50  Total Bilirubin 0.3 - 1.2 mg/dL 0.7 0.3 0.3    Iron deficiency anemia due to chronic blood loss from menorrhagia: Required 2 units of PRBC transfusion on admission-hemoglobin stable.  Continue iron supplementation on discharge.    Previous MD had spoken with Dr. Edman Circle patient's primary GYN MD-recommends using Megace 40 mg twice daily while in the hospital-and to discharge patient on 40 mg p.o. daily and have her follow-up with Dr. Rosana Hoes within [redacted] week along with PCP.   Vitamin B12 deficiency:  Continue subcutaneous vitamin B12 supplementation-will require continued supplementation in the outpatient setting.  Hypokalemia:  Replaced and stable.  Epilepsy: Stable-continue Zonegran  History of hydrocephalus/cerebral palsy s/p VP shunt: Patient does not ambulate and is wheelchair-bound at baseline.   Discharge diagnosis     Principal Problem:   COVID-19 virus infection Active Problems:   Cerebral palsy, diplegic (HCC)   Microcytic anemia   Subclinical hypothyroidism    Discharge instructions    Discharge Instructions    Diet - low sodium heart healthy   Complete by: As directed    Discharge instructions   Complete by: As directed    Follow with Primary MD Leamon Arnt, MD and your primary OB/GYN physician in 7 days   Get CBC, CMP, 2 view Chest X ray -  checked next visit within 1 week by Primary MD   Activity: As tolerated with Full fall precautions use walker/cane & assistance as needed  Disposition Home    Diet: Heart Healthy    Special Instructions: If you have smoked or chewed Tobacco  in the last 2 yrs please stop smoking, stop any regular Alcohol  and or any Recreational drug use.  On your next visit with your primary care physician please Get Medicines reviewed and adjusted.  Please request  your Prim.MD to go over all Hospital Tests and Procedure/Radiological results at the follow up, please get all Hospital records sent to your Prim MD by signing hospital release before you go home.  If you experience worsening of your admission symptoms, develop shortness of breath, life threatening emergency, suicidal or homicidal thoughts you must seek medical attention immediately by calling 911 or calling your MD immediately  if symptoms less severe.  You Must read complete instructions/literature along with all the possible adverse reactions/side effects for all the Medicines you take and that have been prescribed to you. Take any new Medicines after you have completely understood and accpet all the possible adverse reactions/side effects.   Increase activity slowly   Complete by: As directed    MyChart COVID-19 home monitoring program   Complete by: May 19, 2019    Is the patient willing to use the [place] for home monitoring?: Yes   Temperature monitoring   Complete by: May 19, 2019    After how many days would you like to receive a notification of this patient's flowsheet entries?: 1      Discharge Medications   Allergies as of 05/19/2019      Reactions   Ceftibuten Other (See Comments)   "cant wake her up"   Fentanyl Hives, Itching   Tape Hives   "whelts"  Silk tape   Doxycycline Other (See Comments), Nausea Only   unknown unknown      Medication List    TAKE these medications   baclofen 10 MG tablet Commonly known as: LIORESAL Take 5-15 mg by mouth See admin instructions. Take 5 mg in the morning and 15 mg at night   clindamycin 1 % external solution Commonly known as: CLEOCIN T APPLY TOPICALLY TO THE AFFECTED AREA(S) TWICE DAILY What changed: See the new instructions.   cyclobenzaprine 5 MG tablet Commonly known as: FLEXERIL TAKE 1 TABLET BY MOUTH THREE TIMES DAILY AS NEEDED FOR MUSCLE SPASMS What changed:   when to take this  additional  instructions   ferrous sulfate 325 (65 FE) MG tablet Take 1 tablet (325 mg total) by mouth 2 (two) times daily with a meal. What changed:  how much to take  how to take this  when to take this  additional instructions   Fluocinolone Acetonide Body 0.01 % Oil APPLY TO THE SKIN 2-3 TIMES DAILY AS DIRECTED What changed: See the new instructions.   furosemide 20 MG tablet Commonly known as: LASIX TAKE 1 TABLET BY MOUTH EVERY DAY AS NEEDED FOR EDEMA What changed: See the new instructions.   megestrol 40 MG tablet Commonly known as: MEGACE Take 1 tablet (40 mg total) by mouth daily. Start taking on: May 20, 2019   Olopatadine HCl 0.2 % Soln Place 1 drop into both eyes daily as needed. What changed: reasons to take this   ondansetron 4 MG disintegrating tablet Commonly known as: ZOFRAN-ODT Take 1 tablet (4 mg total) by mouth every 8 (eight) hours as needed for nausea or vomiting.   pediatric multivitamin chewable tablet Chew 2 tablets by mouth daily.   potassium chloride 10 MEQ tablet Commonly known as: KLOR-CON TAKE 1 TABLET BY MOUTH DAILY AS NEEDED(WITH LASIX) What changed: See the new instructions.   propranolol 20 MG tablet Commonly known as: INDERAL TAKE 1 TABLET BY MOUTH TWICE DAILY   rizatriptan 10 MG tablet Commonly known as: MAXALT Take 10 mg by mouth as needed for migraine. May repeat in 2 hours if needed   silver sulfADIAZINE 1 % cream Commonly known as: SILVADENE Apply 1 application topically daily.   sulfamethoxazole-trimethoprim 800-160 MG tablet Commonly known as: BACTRIM DS TAKE 1 TABLET BY MOUTH TWICE DAILY What changed: additional instructions   zonisamide 100 MG capsule Commonly known as: ZONEGRAN Take 3 capsules (300 mg total) by mouth daily. What changed:   how much to take  when to take this       Follow-up Information    Leamon Arnt, MD. Schedule an appointment as soon as possible for a visit in 1 week(s).     Specialty: Family Medicine Contact information: [address] [place] [address] [phone]        Sloan Leiter, MD. Schedule an appointment as soon as possible for a visit in 1 week(s).   Specialty: Obstetrics and Gynecology Contact information: [place] [address] [phone]           Major procedures and Radiology Reports - PLEASE review detailed and final reports thoroughly  -        DG Skull 1-3 Views  Result Date: 05/15/2019 CLINICAL DATA:  Shunt evaluation. EXAM: SKULL - 1-3 VIEW COMPARISON:  11/03/2016 FINDINGS: The ventriculostomy portion of the shunt extends from the occipital region centrally, unchanged when compared to the prior study. The bowel is superimposed on the occipital skull. The cervical portion of the shunt catheter appears intact. IMPRESSION: Superior aspect of the ventriculoperitoneal shunt appears intact and unchanged from prior exam. Electronically Signed   By: Lajean Manes M.D.   On: 05/15/2019 13:06   DG Abd 1 View  Result Date: 05/15/2019 CLINICAL DATA:  Shunt malfunction. EXAM: ABDOMEN - 1 VIEW COMPARISON:  11/03/2016. FINDINGS: And intact shunt catheter overlies the lateral right abdomen, terminating ventral to the sacrum. IMPRESSION: Visualized portion of the shunt catheter appears intact. Electronically Signed   By: Lorin Picket M.D.   On: 05/15/2019 13:03   DG Chest Portable 1 View  Result Date: 05/15/2019 CLINICAL DATA:  Shortness of breath EXAM: PORTABLE CHEST 1 VIEW COMPARISON:  11/03/2016 FINDINGS: The heart size and mediastinal contours are within normal limits. Both lungs are clear.  The visualized skeletal structures are unremarkable. Shunt catheter tubing projects over the right neck and chest. IMPRESSION: No acute abnormality of the lungs in AP portable projection. Electronically Signed   By: Eddie Candle M.D.   On: 05/15/2019 09:30    Micro Results     No results found for this or any  previous visit (from the past 240 hour(s)).  Today   Subjective    Jody [name] today has no headache,no chest abdominal pain,no new weakness tingling or numbness, feels much better wants to go home today.     Objective   Blood pressure 105/73, pulse 86, temperature 98.3 F (36.8 C), temperature source Oral, resp. rate 18, height '5\' 4"'  (1.626 m), weight 77.1 kg, last menstrual period 05/16/2019, SpO2 100 %.   Intake/Output Summary (Last 24 hours) at 05/19/2019 1147 Last data filed at 05/19/2019 0000 Gross per 24 hour  Intake 700 ml  Output 300 ml  Net 400 ml    Exam Awake Alert,  No new F.N deficits,   [place].AT,PERRAL Supple Neck,No JVD, No cervical lymphadenopathy appriciated.  Symmetrical Chest wall movement, Good air movement bilaterally, CTAB RRR,No Gallops,Rubs or new Murmurs, No Parasternal Heave +ve B.Sounds, Abd Soft, Non tender, No organomegaly appriciated, No rebound -guarding or rigidity. No Cyanosis, Clubbing or edema, No new Rash or bruise   Data Review   CBC w Diff:  Lab Results  Component Value Date   WBC 8.0 05/19/2019   HGB 10.1 (L) 05/19/2019   HCT 34.1 (L) 05/19/2019   PLT 309 05/19/2019   LYMPHOPCT 23 05/19/2019   MONOPCT 8 05/19/2019   EOSPCT 1 05/19/2019   BASOPCT 1 05/19/2019    CMP:  Lab Results  Component Value Date   NA 137 05/19/2019   K 4.0 05/19/2019   CL 107 05/19/2019   CO2 20 (L) 05/19/2019   BUN 9 05/19/2019   CREATININE 0.59 05/19/2019   CREATININE 0.67 06/01/2018   PROT 6.0 (L) 05/19/2019   ALBUMIN 3.2 (L) 05/19/2019   BILITOT 0.7 05/19/2019   ALKPHOS 54 05/19/2019   AST 21 05/19/2019   ALT 22 05/19/2019  .   Total Time in preparing paper work, data evaluation and todays exam - 75 minutes  Lala Lund M.D on 05/19/2019 at 11:47 AM  [facility]   Office  [phone]

## 2019-05-22 ENCOUNTER — Telehealth (INDEPENDENT_AMBULATORY_CARE_PROVIDER_SITE_OTHER): Payer: Medicare HMO | Admitting: Obstetrics and Gynecology

## 2019-05-22 DIAGNOSIS — N939 Abnormal uterine and vaginal bleeding, unspecified: Secondary | ICD-10-CM

## 2019-05-22 NOTE — Progress Notes (Signed)
Patient did not answer for her virtual visit, she will be rescheduled.

## 2019-05-22 NOTE — Progress Notes (Signed)
Virtual Gyn   Did not answer'

## 2019-05-30 DIAGNOSIS — Z20822 Contact with and (suspected) exposure to covid-19: Secondary | ICD-10-CM | POA: Diagnosis not present

## 2019-05-30 DIAGNOSIS — U071 COVID-19: Secondary | ICD-10-CM | POA: Diagnosis not present

## 2019-05-31 ENCOUNTER — Other Ambulatory Visit: Payer: Self-pay

## 2019-06-03 ENCOUNTER — Other Ambulatory Visit: Payer: Self-pay

## 2019-06-03 ENCOUNTER — Ambulatory Visit (INDEPENDENT_AMBULATORY_CARE_PROVIDER_SITE_OTHER): Payer: Medicare HMO | Admitting: Family Medicine

## 2019-06-03 ENCOUNTER — Encounter: Payer: Self-pay | Admitting: Family Medicine

## 2019-06-03 VITALS — BP 136/86 | HR 108 | Temp 97.9°F | Ht 64.0 in | Wt 167.0 lb

## 2019-06-03 DIAGNOSIS — N926 Irregular menstruation, unspecified: Secondary | ICD-10-CM | POA: Diagnosis not present

## 2019-06-03 DIAGNOSIS — E039 Hypothyroidism, unspecified: Secondary | ICD-10-CM | POA: Diagnosis not present

## 2019-06-03 DIAGNOSIS — E038 Other specified hypothyroidism: Secondary | ICD-10-CM

## 2019-06-03 DIAGNOSIS — G808 Other cerebral palsy: Secondary | ICD-10-CM

## 2019-06-03 DIAGNOSIS — E538 Deficiency of other specified B group vitamins: Secondary | ICD-10-CM

## 2019-06-03 DIAGNOSIS — D500 Iron deficiency anemia secondary to blood loss (chronic): Secondary | ICD-10-CM

## 2019-06-03 DIAGNOSIS — Q039 Congenital hydrocephalus, unspecified: Secondary | ICD-10-CM

## 2019-06-03 DIAGNOSIS — U071 COVID-19: Secondary | ICD-10-CM | POA: Diagnosis not present

## 2019-06-03 NOTE — Progress Notes (Signed)
Subjective  CC:  Chief Complaint  Patient presents with  . Follow-up post Covid    Patient was seen in the ER for covid Sx on 05/15/2019. She has been experiencing nausea, vomiting and diarrhea. Sx resolved, but mother states she did her her Zofran yesterday to eat. Mother states that patient needs follow up lab work and a chest x-ray.    HPI: Jody [name] is a 24 y.o. female who presents to the office today to address the problems listed above in the chief complaint.  Pt was hospitalized 1/13-1/17 due to covid 19 infection with GI sxs and severe anemia s/o two units PRBC.  I reviewed all notes, lab tests, treatments and post discharge instructions.   cxr was negative during hospital course. No new respiratory sxs have developed  Pt continues to feel better. No more GI sxs. No cough. No fever. Has more energy since transfusion  Has f/u with GYN: pt has been referred multiple times for treatment however she/her mother does not f/u. Her last visit here with me was in January of 2020; at that time we discussed f/u with gyn for anemia and heavy menstrual bleeding.   New vit B 12 deficiency. Sounds like received shots in hospital. Not discharged on oral supplements.   Needs f/u cbc and bmp today: lytes and hbg f/u.   Assessment  1. COVID-19 virus infection   2. Iron deficiency anemia due to chronic blood loss   3. Irregular menstrual bleeding   4. Cerebral palsy, diplegic (HCC)   5. Congenital hydrocephalus (HCC)   6. Subclinical hypothyroidism   7. Vitamin B12 deficiency      Plan   covid 19:  Resolving. No new respiratory sxs and stable sat w/ recent nl cxr. Defer repeat today  Anemia: iron and b12; taking oral supplements. Will recheck today after 2u PRBCs and then will need recheck of iron studies and b12 in 3 months.   Heavy menses: on megace and stablizing. Needs gyn f/u for more definitive care plan.   Recheck thyroid function.   CP is stable   Follow up: 3 months   Visit date not found  Orders Placed This Encounter  Procedures  . Basic metabolic panel  . CBC with Differential/Platelet  . T4, free  . T3  . TSH   No orders of the defined types were placed in this encounter.     I reviewed the patients updated PMH, FH, and SocHx.    Patient Active Problem List   Diagnosis Date Noted  . VP (ventriculoperitoneal) shunt status 03/28/2017    Priority: High  . Cerebral palsy, diplegic (HCC) 02/06/2013    Priority: High  . Localization-related symptomatic epilepsy and epileptic syndromes with complex partial seizures, not intractable, without status epilepticus (HCC) 02/06/2013    Priority: High  . Congenital hydrocephalus (HCC) 05/26/2008    Priority: High  . Hidradenitis suppurativa 08/07/2014    Priority: Medium  . Migraine 02/06/2013    Priority: Medium  . Neurogenic bladder 10/17/2002    Priority: Medium  . Constipated 03/27/2015    Priority: Low  . Exotropia 05/21/2010    Priority: Low  . Vitamin B12 deficiency 06/03/2019  . COVID-19 virus infection 05/15/2019  . Subclinical hypothyroidism 06/04/2018  . Microcytic anemia 04/03/2017  . Vitamin D deficiency 04/03/2017   Current Meds  Medication Sig  . baclofen (LIORESAL) 10 MG tablet Take 5-15 mg by mouth See admin instructions. Take 5 mg in the morning and 15 mg  at night  . clindamycin (CLEOCIN T) 1 % external solution APPLY TOPICALLY TO THE AFFECTED AREA(S) TWICE DAILY (Patient taking differently: Apply 1 application topically daily. Under her arm)  . cyclobenzaprine (FLEXERIL) 5 MG tablet TAKE 1 TABLET BY MOUTH THREE TIMES DAILY AS NEEDED FOR MUSCLE SPASMS (Patient taking differently: Take 5 mg by mouth at bedtime. )  . ferrous sulfate 325 (65 FE) MG tablet Take 1 tablet (325 mg total) by mouth 2 (two) times daily with a meal.  . Fluocinolone Acetonide Body 0.01 % OIL APPLY TO THE SKIN 2-3 TIMES DAILY AS DIRECTED (Patient taking differently: Apply 1 application topically every  other day. On Skin)  . furosemide (LASIX) 20 MG tablet TAKE 1 TABLET BY MOUTH EVERY DAY AS NEEDED FOR EDEMA (Patient taking differently: Take 20 mg by mouth daily as needed for fluid. )  . megestrol (MEGACE) 40 MG tablet Take 1 tablet (40 mg total) by mouth daily.  . Olopatadine HCl 0.2 % SOLN Place 1 drop into both eyes daily as needed. (Patient taking differently: Place 1 drop into both eyes daily as needed (allergies). )  . ondansetron (ZOFRAN-ODT) 4 MG disintegrating tablet Take 1 tablet (4 mg total) by mouth every 8 (eight) hours as needed for nausea or vomiting.  . Pediatric Multiple Vit-C-FA (PEDIATRIC MULTIVITAMIN) chewable tablet Chew 2 tablets by mouth daily.  . potassium chloride (K-DUR) 10 MEQ tablet TAKE 1 TABLET BY MOUTH DAILY AS NEEDED(WITH LASIX) (Patient taking differently: Take 10 mEq by mouth as needed (With furosemide). )  . propranolol (INDERAL) 20 MG tablet TAKE 1 TABLET BY MOUTH TWICE DAILY (Patient taking differently: Take 20 mg by mouth 2 (two) times daily. )  . rizatriptan (MAXALT) 10 MG tablet Take 10 mg by mouth as needed for migraine. May repeat in 2 hours if needed  . silver sulfADIAZINE (SILVADENE) 1 % cream Apply 1 application topically daily.  Marland Kitchen sulfamethoxazole-trimethoprim (BACTRIM DS) 800-160 MG tablet TAKE 1 TABLET BY MOUTH TWICE DAILY (Patient taking differently: Take 1 tablet by mouth 2 (two) times daily. continuous)  . vitamin C (ASCORBIC ACID) 250 MG tablet Take 250 mg by mouth daily.  Marland Kitchen zonisamide (ZONEGRAN) 100 MG capsule Take 3 capsules (300 mg total) by mouth daily. (Patient taking differently: Take 100 mg by mouth 3 (three) times daily. )    Allergies: Patient is allergic to ceftibuten; fentanyl; tape; and doxycycline. Family History: Patient family history includes Diabetes in her brother, maternal grandfather, and mother; Gastric cancer in her maternal grandfather; Hyperlipidemia in her mother; Hypertension in her mother; Obesity in her brother and  mother. Social History:  Patient  reports that she has never smoked. She has never used smokeless tobacco. She reports that she does not drink alcohol or use drugs.  Review of Systems: Constitutional: Negative for fever malaise or anorexia Cardiovascular: negative for chest pain Respiratory: negative for SOB or persistent cough Gastrointestinal: negative for abdominal pain  Objective  Vitals: BP 136/86 (BP Location: Left Arm, Patient Position: Sitting, Cuff Size: Normal)   Pulse (!) 108   Temp 97.9 F (36.6 C) (Temporal)   Ht 5\' 4"  (1.626 m)   Wt 167 lb (75.8 kg)   LMP 05/16/2019   SpO2 98%   BMI 28.67 kg/m  General: no acute distress , A&Ox3, in wheel chair Psych: appears happy Cardiovascular:  RRR without murmur or gallop.  Respiratory:  Good breath sounds bilaterally, CTAB with normal respiratory effort Skin:  Warm, no rashes  Lab Results  Component Value Date   WBC 8.0 05/19/2019   HGB 10.1 (L) 05/19/2019   HCT 34.1 (L) 05/19/2019   MCV 73.0 (L) 05/19/2019   PLT 309 05/19/2019   Lab Results  Component Value Date   CREATININE 0.59 05/19/2019   BUN 9 05/19/2019   NA 137 05/19/2019   K 4.0 05/19/2019   CL 107 05/19/2019   CO2 20 (L) 05/19/2019      Commons side effects, risks, benefits, and alternatives for medications and treatment plan prescribed today were discussed, and the patient expressed understanding of the given instructions. Patient is instructed to call or message via MyChart if he/she has any questions or concerns regarding our treatment plan. No barriers to understanding were identified. We discussed Red Flag symptoms and signs in detail. Patient expressed understanding regarding what to do in case of urgent or emergency type symptoms.   Medication list was reconciled, printed and provided to the patient in AVS. Patient instructions and summary information was reviewed with the patient as documented in the AVS. This note was prepared with assistance of  Dragon voice recognition software. Occasional wrong-word or sound-a-like substitutions may have occurred due to the inherent limitations of voice recognition software  This visit occurred during the SARS-CoV-2 public health emergency.  Safety protocols were in place, including screening questions prior to the visit, additional usage of staff PPE, and extensive cleaning of exam room while observing appropriate contact time as indicated for disinfecting solutions.

## 2019-06-03 NOTE — Patient Instructions (Addendum)
Please return in 3 months for your annual complete physical; please come fasting.  Please add vitamin B12 1000mg  daily to your medications.  Please the GYN to get your menstrual bleeding under better control so you can improve your severe anemia. Continue the iron twice daily as well.   If you have any questions or concerns, please don't hesitate to send me a message via MyChart or call the office at [phone]. Thank you for visiting with [phone] today! It's our pleasure caring for you.  Your blood type is AB positive

## 2019-06-04 ENCOUNTER — Other Ambulatory Visit: Payer: Medicare HMO

## 2019-06-04 DIAGNOSIS — D729 Disorder of white blood cells, unspecified: Secondary | ICD-10-CM | POA: Diagnosis not present

## 2019-06-04 LAB — CBC WITH DIFFERENTIAL/PLATELET
Basophils Absolute: 0.1 10*3/uL (ref 0.0–0.1)
Basophils Relative: 0.9 % (ref 0.0–3.0)
Eosinophils Absolute: 0.1 10*3/uL (ref 0.0–0.7)
Eosinophils Relative: 0.8 % (ref 0.0–5.0)
HCT: 40.1 % (ref 36.0–46.0)
Hemoglobin: 12.8 g/dL (ref 12.0–15.0)
Lymphocytes Relative: 23.6 % (ref 12.0–46.0)
Lymphs Abs: 1.6 10*3/uL (ref 0.7–4.0)
MCHC: 31.8 g/dL (ref 30.0–36.0)
MCV: 79.9 fl (ref 78.0–100.0)
Monocytes Absolute: 0.6 10*3/uL (ref 0.1–1.0)
Monocytes Relative: 8.3 % (ref 3.0–12.0)
Neutro Abs: 4.5 10*3/uL (ref 1.4–7.7)
Neutrophils Relative %: 66.4 % (ref 43.0–77.0)
Platelets: 375 10*3/uL (ref 150.0–400.0)
RBC: 5.03 Mil/uL (ref 3.87–5.11)
RDW: 31.6 % — ABNORMAL HIGH (ref 11.5–15.5)
WBC: 6.8 10*3/uL (ref 4.0–10.5)

## 2019-06-04 LAB — T3: T3, Total: 130 ng/dL (ref 76–181)

## 2019-06-04 LAB — BASIC METABOLIC PANEL
BUN: 6 mg/dL (ref 6–23)
CO2: 19 mEq/L (ref 19–32)
Calcium: 9.8 mg/dL (ref 8.4–10.5)
Chloride: 109 mEq/L (ref 96–112)
Creatinine, Ser: 0.71 mg/dL (ref 0.40–1.20)
GFR: 122.76 mL/min (ref >60.00)
Glucose, Bld: 83 mg/dL (ref 70–99)
Potassium: 4 mEq/L (ref 3.5–5.1)
Sodium: 139 mEq/L (ref 135–145)

## 2019-06-04 LAB — T4, FREE: Free T4: 0.96 ng/dL (ref 0.60–1.60)

## 2019-06-04 LAB — TSH: TSH: 2.61 u[IU]/mL (ref 0.35–4.50)

## 2019-06-05 LAB — PATHOLOGIST SMEAR REVIEW

## 2019-06-11 ENCOUNTER — Other Ambulatory Visit: Payer: Self-pay

## 2019-06-18 ENCOUNTER — Other Ambulatory Visit: Payer: Self-pay | Admitting: Family Medicine

## 2019-06-22 ENCOUNTER — Other Ambulatory Visit: Payer: Self-pay | Admitting: Family Medicine

## 2019-06-24 DIAGNOSIS — G809 Cerebral palsy, unspecified: Secondary | ICD-10-CM | POA: Diagnosis not present

## 2019-06-26 ENCOUNTER — Telehealth: Payer: Self-pay

## 2019-06-26 ENCOUNTER — Other Ambulatory Visit: Payer: Self-pay

## 2019-06-26 DIAGNOSIS — L732 Hidradenitis suppurativa: Secondary | ICD-10-CM

## 2019-06-26 NOTE — Telephone Encounter (Signed)
LAST APPOINTMENT DATE: 06/03/2019  NEXT APPOINTMENT DATE:@5 /04/2020   LAST REFILL: 04/30/2019 (Zonisamide)  QTY: 90 capsule (zonisamide)  10 tablet (Rizatriptan)

## 2019-06-26 NOTE — Telephone Encounter (Signed)
Medication request sent to Dr. Andy. 

## 2019-06-26 NOTE — Telephone Encounter (Signed)
MEDICATION:zonisamide (ZONEGRAN) 100 MG capsule and rizatriptan (MAXALT) 10 MG tablet  PHARMACY: [id number] - [place], [place] - [address] AT [facility] [address] [facility] Phone:  [phone]  Fax:  [phone]       Comments:   **Let patient know to contact pharmacy at the end of the day to make sure medication is ready. **  ** Please notify patient to allow 48-72 hours to process**  **Encourage patient to contact the pharmacy for refills or they can request refills through [facility]**

## 2019-06-27 MED ORDER — RIZATRIPTAN BENZOATE 10 MG PO TABS: 10.0000 mg | ORAL_TABLET | ORAL | 1 refills | Status: DC | PRN

## 2019-07-05 ENCOUNTER — Ambulatory Visit: Payer: Medicare HMO

## 2019-07-05 ENCOUNTER — Other Ambulatory Visit: Payer: Self-pay

## 2019-07-18 ENCOUNTER — Other Ambulatory Visit: Payer: Self-pay | Admitting: Family Medicine

## 2019-07-22 ENCOUNTER — Telehealth: Payer: Self-pay | Admitting: Family Medicine

## 2019-07-22 DIAGNOSIS — G910 Communicating hydrocephalus: Secondary | ICD-10-CM | POA: Diagnosis not present

## 2019-07-22 DIAGNOSIS — I499 Cardiac arrhythmia, unspecified: Secondary | ICD-10-CM | POA: Diagnosis not present

## 2019-07-22 DIAGNOSIS — G809 Cerebral palsy, unspecified: Secondary | ICD-10-CM | POA: Diagnosis not present

## 2019-07-22 DIAGNOSIS — R32 Unspecified urinary incontinence: Secondary | ICD-10-CM | POA: Diagnosis not present

## 2019-07-22 NOTE — Telephone Encounter (Signed)
Called patient states that she likes to have fluconazole on hand for yeast due to daily antibiotic. Last time called in patient states that she was given #30 and lasted for a while. She is currently having symptoms and is using otc cream that is not helping.   She also states that she is out of Zofran and needs refill.   Refill for eye drops have been sent in on Friday.

## 2019-07-22 NOTE — Telephone Encounter (Signed)
MEDICATION: Zofran 4 MG, fluconazole & eye drops (did not see in pt chart)  PHARMACY: [facility] [address]  Comments: Please call pt and verify which medications she needs refilled. Pt was not able to tell me all the medications. Not all medications are listed in chart. Pt states requests were faxed over by pharmacy  **Let patient know to contact pharmacy at the end of the day to make sure medication is ready. **  ** Please notify patient to allow 48-72 hours to process**  **Encourage patient to contact the pharmacy for refills or they can request refills through [facility]**

## 2019-07-23 MED ORDER — ONDANSETRON 4 MG PO TBDP: 4.0000 mg | ORAL_TABLET | Freq: Three times a day (TID) | ORAL | 3 refills | Status: DC | PRN

## 2019-07-23 MED ORDER — FLUCONAZOLE 150 MG PO TABS: 150.0000 mg | ORAL_TABLET | Freq: Once | ORAL | 2 refills | Status: AC

## 2019-07-23 NOTE — Telephone Encounter (Signed)
Please give diflucan 1 po daily and repeat in 2 days if needed, #10 with 2 refills. Thanks. Can refill zofran # 30 with 3 rf.

## 2019-07-23 NOTE — Addendum Note (Signed)
Addended by: Donnamarie Poag on: 07/23/2019 01:19 PM   Modules accepted: Orders

## 2019-07-23 NOTE — Telephone Encounter (Signed)
Ok to give medical necessity letter for transfer board; pt requires transfer board due to wheel chair bound status due to cerbral palsy.

## 2019-07-23 NOTE — Telephone Encounter (Signed)
Called patient to let know about scripts.   She states that they need a letter of medical necessity for a transfer board so that it will be covered.

## 2019-07-23 NOTE — Addendum Note (Signed)
Addended by: Donnamarie Poag on: 07/23/2019 01:17 PM   Modules accepted: Orders

## 2019-07-25 ENCOUNTER — Other Ambulatory Visit: Payer: Self-pay

## 2019-07-25 DIAGNOSIS — B372 Candidiasis of skin and nail: Secondary | ICD-10-CM

## 2019-07-25 MED ORDER — FLUCONAZOLE 100 MG PO TABS: ORAL_TABLET | ORAL | 0 refills | Status: DC

## 2019-07-25 NOTE — Telephone Encounter (Signed)
Called patient letter printed and placed in mail after confirming address

## 2019-07-30 DIAGNOSIS — M62838 Other muscle spasm: Secondary | ICD-10-CM | POA: Diagnosis not present

## 2019-07-30 DIAGNOSIS — G40909 Epilepsy, unspecified, not intractable, without status epilepticus: Secondary | ICD-10-CM | POA: Diagnosis not present

## 2019-07-30 DIAGNOSIS — G800 Spastic quadriplegic cerebral palsy: Secondary | ICD-10-CM | POA: Diagnosis not present

## 2019-07-30 DIAGNOSIS — Z7409 Other reduced mobility: Secondary | ICD-10-CM | POA: Diagnosis not present

## 2019-07-30 DIAGNOSIS — N921 Excessive and frequent menstruation with irregular cycle: Secondary | ICD-10-CM | POA: Diagnosis not present

## 2019-07-30 DIAGNOSIS — Z982 Presence of cerebrospinal fluid drainage device: Secondary | ICD-10-CM | POA: Diagnosis not present

## 2019-08-13 ENCOUNTER — Ambulatory Visit: Payer: Self-pay | Admitting: Obstetrics and Gynecology

## 2019-08-13 ENCOUNTER — Ambulatory Visit: Payer: Self-pay

## 2019-08-13 DIAGNOSIS — Z0289 Encounter for other administrative examinations: Secondary | ICD-10-CM

## 2019-09-10 ENCOUNTER — Other Ambulatory Visit: Payer: Self-pay | Admitting: Family Medicine

## 2019-09-11 ENCOUNTER — Encounter: Payer: Medicare HMO | Admitting: Family Medicine

## 2019-09-18 DIAGNOSIS — R32 Unspecified urinary incontinence: Secondary | ICD-10-CM | POA: Diagnosis not present

## 2019-09-18 DIAGNOSIS — G910 Communicating hydrocephalus: Secondary | ICD-10-CM | POA: Diagnosis not present

## 2019-09-18 DIAGNOSIS — I499 Cardiac arrhythmia, unspecified: Secondary | ICD-10-CM | POA: Diagnosis not present

## 2019-09-18 DIAGNOSIS — G809 Cerebral palsy, unspecified: Secondary | ICD-10-CM | POA: Diagnosis not present

## 2019-10-22 DIAGNOSIS — R32 Unspecified urinary incontinence: Secondary | ICD-10-CM | POA: Diagnosis not present

## 2019-10-22 DIAGNOSIS — G809 Cerebral palsy, unspecified: Secondary | ICD-10-CM | POA: Diagnosis not present

## 2019-10-22 DIAGNOSIS — G910 Communicating hydrocephalus: Secondary | ICD-10-CM | POA: Diagnosis not present

## 2019-10-22 DIAGNOSIS — I499 Cardiac arrhythmia, unspecified: Secondary | ICD-10-CM | POA: Diagnosis not present

## 2019-10-30 ENCOUNTER — Encounter: Payer: Self-pay | Admitting: Family Medicine

## 2019-10-30 ENCOUNTER — Encounter: Payer: Medicare HMO | Admitting: Family Medicine

## 2019-11-08 ENCOUNTER — Other Ambulatory Visit: Payer: Self-pay | Admitting: Family Medicine

## 2019-11-25 ENCOUNTER — Encounter: Payer: Self-pay | Admitting: Family Medicine

## 2020-01-20 ENCOUNTER — Other Ambulatory Visit: Payer: Self-pay | Admitting: Family Medicine

## 2020-01-20 DIAGNOSIS — R32 Unspecified urinary incontinence: Secondary | ICD-10-CM | POA: Diagnosis not present

## 2020-01-20 DIAGNOSIS — G809 Cerebral palsy, unspecified: Secondary | ICD-10-CM | POA: Diagnosis not present

## 2020-01-20 DIAGNOSIS — G910 Communicating hydrocephalus: Secondary | ICD-10-CM | POA: Diagnosis not present

## 2020-01-20 DIAGNOSIS — L732 Hidradenitis suppurativa: Secondary | ICD-10-CM

## 2020-01-20 DIAGNOSIS — I499 Cardiac arrhythmia, unspecified: Secondary | ICD-10-CM | POA: Diagnosis not present

## 2020-02-23 ENCOUNTER — Other Ambulatory Visit: Payer: Self-pay | Admitting: Family Medicine

## 2020-04-02 DIAGNOSIS — G910 Communicating hydrocephalus: Secondary | ICD-10-CM | POA: Diagnosis not present

## 2020-04-02 DIAGNOSIS — I499 Cardiac arrhythmia, unspecified: Secondary | ICD-10-CM | POA: Diagnosis not present

## 2020-04-02 DIAGNOSIS — R32 Unspecified urinary incontinence: Secondary | ICD-10-CM | POA: Diagnosis not present

## 2020-04-02 DIAGNOSIS — G809 Cerebral palsy, unspecified: Secondary | ICD-10-CM | POA: Diagnosis not present

## 2020-05-10 ENCOUNTER — Other Ambulatory Visit: Payer: Self-pay | Admitting: Family Medicine

## 2020-05-10 DIAGNOSIS — L732 Hidradenitis suppurativa: Secondary | ICD-10-CM

## 2020-05-12 ENCOUNTER — Ambulatory Visit: Payer: Medicare HMO | Admitting: Family Medicine

## 2020-05-15 DIAGNOSIS — I499 Cardiac arrhythmia, unspecified: Secondary | ICD-10-CM | POA: Diagnosis not present

## 2020-05-15 DIAGNOSIS — G910 Communicating hydrocephalus: Secondary | ICD-10-CM | POA: Diagnosis not present

## 2020-05-15 DIAGNOSIS — R32 Unspecified urinary incontinence: Secondary | ICD-10-CM | POA: Diagnosis not present

## 2020-05-15 DIAGNOSIS — G809 Cerebral palsy, unspecified: Secondary | ICD-10-CM | POA: Diagnosis not present

## 2020-06-09 ENCOUNTER — Other Ambulatory Visit: Payer: Self-pay | Admitting: Family Medicine

## 2020-06-09 DIAGNOSIS — L732 Hidradenitis suppurativa: Secondary | ICD-10-CM

## 2020-06-10 ENCOUNTER — Other Ambulatory Visit: Payer: Self-pay | Admitting: Family Medicine

## 2020-06-10 DIAGNOSIS — B372 Candidiasis of skin and nail: Secondary | ICD-10-CM

## 2020-06-12 ENCOUNTER — Encounter: Payer: Self-pay | Admitting: Family Medicine

## 2020-06-12 DIAGNOSIS — G809 Cerebral palsy, unspecified: Secondary | ICD-10-CM | POA: Diagnosis not present

## 2020-06-12 DIAGNOSIS — I499 Cardiac arrhythmia, unspecified: Secondary | ICD-10-CM | POA: Diagnosis not present

## 2020-06-12 DIAGNOSIS — R32 Unspecified urinary incontinence: Secondary | ICD-10-CM | POA: Diagnosis not present

## 2020-06-12 DIAGNOSIS — G910 Communicating hydrocephalus: Secondary | ICD-10-CM | POA: Diagnosis not present

## 2020-07-28 ENCOUNTER — Encounter: Payer: Medicare HMO | Admitting: Family Medicine

## 2020-07-29 ENCOUNTER — Telehealth: Payer: Self-pay

## 2020-07-29 ENCOUNTER — Encounter: Payer: Medicare HMO | Admitting: Family Medicine

## 2020-07-29 NOTE — Telephone Encounter (Signed)
Patient has been no show for the past 7 scheduled appts. PCP requesting dismissal.

## 2020-08-04 DIAGNOSIS — G910 Communicating hydrocephalus: Secondary | ICD-10-CM | POA: Diagnosis not present

## 2020-08-04 DIAGNOSIS — R32 Unspecified urinary incontinence: Secondary | ICD-10-CM | POA: Diagnosis not present

## 2020-08-04 DIAGNOSIS — I499 Cardiac arrhythmia, unspecified: Secondary | ICD-10-CM | POA: Diagnosis not present

## 2020-08-04 DIAGNOSIS — G809 Cerebral palsy, unspecified: Secondary | ICD-10-CM | POA: Diagnosis not present

## 2020-08-10 ENCOUNTER — Encounter: Payer: Self-pay | Admitting: Family Medicine

## 2020-08-13 NOTE — Telephone Encounter (Signed)
Jody [name], were you able to complete this?

## 2020-09-23 ENCOUNTER — Telehealth: Payer: Self-pay

## 2020-09-23 NOTE — Telephone Encounter (Signed)
Patients mother is request a letter stating that the mother organizing medication and the patient administers medication by herself  It needs to be sent to her employer [phone] Ronda Fairly

## 2020-09-24 NOTE — Telephone Encounter (Signed)
Patient has no showed at least 2 of her last appts. Has not been seen in over a year. Please advise

## 2020-09-25 NOTE — Telephone Encounter (Signed)
Called patient unable to reach  

## 2020-09-25 NOTE — Telephone Encounter (Signed)
Please write letter:  Dear employer,  Miss Brinker is able to take her medications at work. Her mother organizes and manages the medications for her. Please allow her to do this.   ...   Then fax and notify mother. Also let her know that her daughter needs an appointment and if she no shows again, we will be unable to continue her care. Thanks!

## 2020-09-25 NOTE — Telephone Encounter (Signed)
Patients mom is requesting a call back because letter was incorrect and needs to state     " patients mother organizes medication In pill box and patient " Johnna administers own medication to herself ? "    OR give a call to supervisor Latanya Maudlin [phone] to make sure the letter states what it needs to to clear patient for work.

## 2020-09-25 NOTE — Telephone Encounter (Signed)
Spoke with supervisor, will correct letter and refax.

## 2020-09-25 NOTE — Telephone Encounter (Signed)
Spoke with patients mom, gave verbal understanding to call and set up an appointment. Faxed letter to employer

## 2020-10-07 ENCOUNTER — Telehealth: Payer: Self-pay

## 2020-10-07 NOTE — Telephone Encounter (Signed)
States patients employer [employer] is requesting a list of patients current meds to be signed by Dr. Mardelle Matte and faxed over to them at [phone] Attn: Layla Barter   Please follow up in regard.

## 2020-10-07 NOTE — Telephone Encounter (Signed)
Please advise 

## 2020-10-08 NOTE — Telephone Encounter (Signed)
Med list has been sent.

## 2020-10-12 DIAGNOSIS — R32 Unspecified urinary incontinence: Secondary | ICD-10-CM | POA: Diagnosis not present

## 2020-10-12 DIAGNOSIS — I499 Cardiac arrhythmia, unspecified: Secondary | ICD-10-CM | POA: Diagnosis not present

## 2020-10-12 DIAGNOSIS — G809 Cerebral palsy, unspecified: Secondary | ICD-10-CM | POA: Diagnosis not present

## 2020-10-12 DIAGNOSIS — G910 Communicating hydrocephalus: Secondary | ICD-10-CM | POA: Diagnosis not present

## 2020-10-15 ENCOUNTER — Other Ambulatory Visit: Payer: Medicare HMO

## 2020-10-22 ENCOUNTER — Ambulatory Visit: Payer: Medicare HMO | Attending: Internal Medicine

## 2020-10-22 DIAGNOSIS — Z20822 Contact with and (suspected) exposure to covid-19: Secondary | ICD-10-CM | POA: Diagnosis not present

## 2020-10-24 LAB — SARS-COV-2, NAA 2 DAY TAT

## 2020-10-24 LAB — NOVEL CORONAVIRUS, NAA: SARS-CoV-2, NAA: NOT DETECTED

## 2020-10-30 DIAGNOSIS — T1490XA Injury, unspecified, initial encounter: Secondary | ICD-10-CM | POA: Diagnosis not present

## 2020-11-09 ENCOUNTER — Other Ambulatory Visit: Payer: Self-pay | Admitting: Family Medicine

## 2020-11-25 ENCOUNTER — Other Ambulatory Visit: Payer: Self-pay | Admitting: Family Medicine

## 2020-11-30 ENCOUNTER — Telehealth: Payer: Self-pay

## 2020-11-30 NOTE — Telephone Encounter (Signed)
Please see message and advise 

## 2020-11-30 NOTE — Telephone Encounter (Signed)
Patients mother called to schedule a "med refill apt" and there are modifiers on chart to not schedule but there is no letter or any noted dismissing patient am I able to schedule patient ?

## 2020-12-24 ENCOUNTER — Other Ambulatory Visit: Payer: Self-pay | Admitting: Family Medicine

## 2020-12-24 DIAGNOSIS — G910 Communicating hydrocephalus: Secondary | ICD-10-CM | POA: Diagnosis not present

## 2020-12-24 DIAGNOSIS — I499 Cardiac arrhythmia, unspecified: Secondary | ICD-10-CM | POA: Diagnosis not present

## 2020-12-24 DIAGNOSIS — R32 Unspecified urinary incontinence: Secondary | ICD-10-CM | POA: Diagnosis not present

## 2020-12-24 DIAGNOSIS — G809 Cerebral palsy, unspecified: Secondary | ICD-10-CM | POA: Diagnosis not present

## 2020-12-29 ENCOUNTER — Ambulatory Visit (INDEPENDENT_AMBULATORY_CARE_PROVIDER_SITE_OTHER): Payer: Medicare HMO | Admitting: Physician Assistant

## 2020-12-29 ENCOUNTER — Encounter: Payer: Self-pay | Admitting: Physician Assistant

## 2020-12-29 ENCOUNTER — Other Ambulatory Visit: Payer: Self-pay

## 2020-12-29 VITALS — BP 120/86 | HR 124 | Temp 98.6°F | Ht 64.0 in

## 2020-12-29 DIAGNOSIS — Q039 Congenital hydrocephalus, unspecified: Secondary | ICD-10-CM | POA: Diagnosis not present

## 2020-12-29 DIAGNOSIS — L853 Xerosis cutis: Secondary | ICD-10-CM | POA: Diagnosis not present

## 2020-12-29 DIAGNOSIS — D500 Iron deficiency anemia secondary to blood loss (chronic): Secondary | ICD-10-CM

## 2020-12-29 DIAGNOSIS — G43909 Migraine, unspecified, not intractable, without status migrainosus: Secondary | ICD-10-CM | POA: Diagnosis not present

## 2020-12-29 DIAGNOSIS — R600 Localized edema: Secondary | ICD-10-CM | POA: Diagnosis not present

## 2020-12-29 DIAGNOSIS — R110 Nausea: Secondary | ICD-10-CM

## 2020-12-29 DIAGNOSIS — G808 Other cerebral palsy: Secondary | ICD-10-CM

## 2020-12-29 DIAGNOSIS — T7840XD Allergy, unspecified, subsequent encounter: Secondary | ICD-10-CM

## 2020-12-29 DIAGNOSIS — Z23 Encounter for immunization: Secondary | ICD-10-CM | POA: Diagnosis not present

## 2020-12-29 DIAGNOSIS — L732 Hidradenitis suppurativa: Secondary | ICD-10-CM

## 2020-12-29 MED ORDER — CYCLOBENZAPRINE HCL 5 MG PO TABS: 5.0000 mg | ORAL_TABLET | Freq: Three times a day (TID) | ORAL | 0 refills | Status: DC | PRN

## 2020-12-29 MED ORDER — ZONISAMIDE 100 MG PO CAPS: 100.0000 mg | ORAL_CAPSULE | Freq: Three times a day (TID) | ORAL | 0 refills | Status: DC

## 2020-12-29 MED ORDER — SULFAMETHOXAZOLE-TRIMETHOPRIM 800-160 MG PO TABS
1.0000 | ORAL_TABLET | Freq: Two times a day (BID) | ORAL | 0 refills | Status: DC
Start: 2020-12-29 — End: 2021-03-08

## 2020-12-29 MED ORDER — ONDANSETRON 4 MG PO TBDP: 4.0000 mg | ORAL_TABLET | Freq: Three times a day (TID) | ORAL | 0 refills | Status: DC | PRN

## 2020-12-29 MED ORDER — FUROSEMIDE 20 MG PO TABS: ORAL_TABLET | ORAL | 0 refills | Status: DC

## 2020-12-29 MED ORDER — OLOPATADINE HCL 0.2 % OP SOLN: 1.0000 [drp] | Freq: Every day | OPHTHALMIC | 0 refills | Status: DC | PRN

## 2020-12-29 MED ORDER — FLUCONAZOLE 150 MG PO TABS: ORAL_TABLET | ORAL | 0 refills | Status: AC

## 2020-12-29 MED ORDER — POTASSIUM CHLORIDE CRYS ER 10 MEQ PO TBCR: 10.0000 meq | EXTENDED_RELEASE_TABLET | Freq: Every day | ORAL | 0 refills | Status: DC | PRN

## 2020-12-29 MED ORDER — BACLOFEN 10 MG PO TABS: ORAL_TABLET | ORAL | 0 refills | Status: DC

## 2020-12-29 MED ORDER — CLINDAMYCIN PHOSPHATE 1 % EX SOLN: 1.0000 "application " | Freq: Every day | CUTANEOUS | 0 refills | Status: DC

## 2020-12-29 MED ORDER — RIZATRIPTAN BENZOATE 10 MG PO TABS: ORAL_TABLET | ORAL | 1 refills | Status: DC

## 2020-12-29 MED ORDER — FLUOCINOLONE ACETONIDE BODY 0.01 % EX OIL
1.0000 | TOPICAL_OIL | CUTANEOUS | 0 refills | Status: DC | PRN
Start: 2020-12-29 — End: 2021-03-08

## 2020-12-29 MED ORDER — PROPRANOLOL HCL 20 MG PO TABS: 20.0000 mg | ORAL_TABLET | Freq: Two times a day (BID) | ORAL | 0 refills | Status: DC

## 2020-12-29 NOTE — Progress Notes (Deleted)
Jody [name] is a 25 y.o. female is here to discuss:  SCRIBE STATEMENT  History of Present Illness:   No chief complaint on file.   HPI  Health Maintenance Due  Topic Date Due  . COVID-19 Vaccine (1) Never done  . Hepatitis C Screening  Never done  . PAP-Cervical Cytology Screening  Never done  . INFLUENZA VACCINE  11/30/2020    Past Medical History:  Diagnosis Date  . Anemia   . Cerebral palsy (HCC)   . Constipation   . GERD (gastroesophageal reflux disease)   . History of encephalopathy   . Hydradenitis    bilateral  . Hydrocephaly (HCC)   . Migraine   . Neurogenic bladder   . Seizures (HCC)   . Subclinical hypothyroidism 06/04/2018   04/2018  . Vitamin D deficiency 04/03/2017   Vit d 25oh, 12.7 03/2017 - see [facility] labs  . VP (ventriculoperitoneal) shunt status      Social History   Tobacco Use  . Smoking status: Never  . Smokeless tobacco: Never  Vaping Use  . Vaping Use: Never used  Substance Use Topics  . Alcohol use: No  . Drug use: No    Past Surgical History:  Procedure Laterality Date  . BOTOX INJECTION    . DENTAL EXAMINATION UNDER ANESTHESIA    . EYE SURGERY    . HAMSTRING LENGTHENING    . shunt replacement  2008   5th shunt for pt.   . TENDON LENGTHENING    . VENTRICULOPERITONEAL SHUNT      Family History  Problem Relation Age of Onset  . Diabetes Mother   . Hyperlipidemia Mother   . Hypertension Mother   . Obesity Mother   . Diabetes Brother   . Obesity Brother   . Gastric cancer Maternal Grandfather   . Diabetes Maternal Grandfather     PMHx, SurgHx, SocialHx, FamHx, Medications, and Allergies were reviewed in the Visit Navigator and updated as appropriate.   Patient Active Problem List   Diagnosis Date Noted  . Vitamin B12 deficiency 06/03/2019  . COVID-19 virus infection 05/15/2019  . Subclinical hypothyroidism 06/04/2018  . Microcytic anemia 04/03/2017  . Vitamin D deficiency 04/03/2017  . VP (ventriculoperitoneal)  shunt status 03/28/2017  . Constipated 03/27/2015  . Hidradenitis suppurativa 08/07/2014  . Cerebral palsy, diplegic (HCC) 02/06/2013  . Localization-related symptomatic epilepsy and epileptic syndromes with complex partial seizures, not intractable, without status epilepticus (HCC) 02/06/2013  . Migraine 02/06/2013  . Exotropia 05/21/2010  . Congenital hydrocephalus (HCC) 05/26/2008  . Neurogenic bladder 10/17/2002    Social History   Tobacco Use  . Smoking status: Never  . Smokeless tobacco: Never  Vaping Use  . Vaping Use: Never used  Substance Use Topics  . Alcohol use: No  . Drug use: No    Current Medications and Allergies:    Current Outpatient Medications:  .  baclofen (LIORESAL) 10 MG tablet, TAKE 1 TABLET(10 MG) BY MOUTH TWICE DAILY, Disp: 180 tablet, Rfl: 3 .  clindamycin (CLEOCIN T) 1 % external solution, APPLY TOPICALLY TO THE AFFECTED AREA(S) TWICE DAILY (Patient taking differently: Apply 1 application topically daily. Under her arm), Disp: 60 mL, Rfl: 5 .  cyclobenzaprine (FLEXERIL) 5 MG tablet, TAKE 1 TABLET BY MOUTH THREE TIMES DAILY AS NEEDED FOR MUSCLE SPASMS, Disp: 270 tablet, Rfl: 3 .  ferrous sulfate 325 (65 FE) MG tablet, Take 1 tablet (325 mg total) by mouth 2 (two) times daily with a meal., Disp: 60  tablet, Rfl: 0 .  fluconazole (DIFLUCAN) 100 MG tablet, TAKE 2 TABLETS BY MOUTH ONE DAY A WEEK AS NEEDED FOR YEAST INFECTION, Disp: 30 tablet, Rfl: 0 .  Fluocinolone Acetonide Body 0.01 % OIL, APPLY TO THE SKIN 2-3 TIMES DAILY AS DIRECTED (Patient taking differently: Apply 1 application topically every other day. On Skin), Disp: 118.28 mL, Rfl: 2 .  furosemide (LASIX) 20 MG tablet, TAKE 1 TABLET BY MOUTH EVERY DAY AS NEEDED FOR EDEMA, Disp: 30 tablet, Rfl: 2 .  megestrol (MEGACE) 40 MG tablet, Take 1 tablet (40 mg total) by mouth daily., Disp: 30 tablet, Rfl: 0 .  Olopatadine HCl 0.2 % SOLN, Place 1 drop into both eyes daily as needed (allergies)., Disp: 2.5 mL,  Rfl: 3 .  ondansetron (ZOFRAN-ODT) 4 MG disintegrating tablet, Take 1 tablet (4 mg total) by mouth every 8 (eight) hours as needed for nausea or vomiting., Disp: 30 tablet, Rfl: 3 .  Pediatric Multiple Vit-C-FA (PEDIATRIC MULTIVITAMIN) chewable tablet, Chew 2 tablets by mouth daily., Disp: , Rfl:  .  potassium chloride (K-DUR) 10 MEQ tablet, TAKE 1 TABLET BY MOUTH DAILY AS NEEDED(WITH LASIX) (Patient taking differently: Take 10 mEq by mouth as needed (With furosemide). ), Disp: 90 tablet, Rfl: 1 .  propranolol (INDERAL) 20 MG tablet, TAKE 1 TABLET BY MOUTH TWICE DAILY, Disp: 180 tablet, Rfl: 3 .  rizatriptan (MAXALT) 10 MG tablet, TAKE 1 TABLET BY MOUTH AS NEEDED FOR MIGRAINE. MAY REPEAT 2 HOURS IF NEEDED., Disp: 10 tablet, Rfl: 1 .  silver sulfADIAZINE (SILVADENE) 1 % cream, Apply 1 application topically daily., Disp: 50 g, Rfl: 0 .  sulfamethoxazole-trimethoprim (BACTRIM DS) 800-160 MG tablet, TAKE 1 TABLET BY MOUTH TWICE DAILY (Patient taking differently: Take 1 tablet by mouth 2 (two) times daily. continuous), Disp: 60 tablet, Rfl: 11 .  vitamin C (ASCORBIC ACID) 250 MG tablet, Take 250 mg by mouth daily., Disp: , Rfl:  .  zonisamide (ZONEGRAN) 100 MG capsule, Take 3 capsules (300 mg total) by mouth daily. (Patient taking differently: Take 100 mg by mouth 3 (three) times daily. ), Disp: 90 capsule, Rfl: 3   Allergies  Allergen Reactions  . Ceftibuten Other (See Comments)    "cant wake her up"  . Fentanyl Hives and Itching  . Tape Hives    "whelts"  Silk tape  . Doxycycline Other (See Comments) and Nausea Only    unknown unknown     Review of Systems   ROS  Vitals:  There were no vitals filed for this visit.   There is no height or weight on file to calculate BMI.   Physical Exam:    Physical Exam   Assessment and Plan:    There are no diagnoses linked to this encounter.    ***  Jarold Motto, PA-C , [facility] 12/29/2020  Follow-up: No follow-ups on  file.

## 2020-12-29 NOTE — Progress Notes (Signed)
Jody [name] is a 25 y.o. female is here to discuss:    History of Present Illness:   Chief Complaint  Patient presents with  . Medication Refill    HPI Accompanied by her mother   Migraines:  Maxalt 10MG  and Zongran 100MG   She is tolerating her medications without any adverse side effects.   Cerebral Palsy Flexeril 5 MG  PRN and Baclofen 10MG . She has undergone 5 back procedures in the past and also receive Botox injections. Since she has been missing her Botox injections lately, her mother has been giving her Flexeril every other night. She has cramps and muscle spasticity issues.   Congential Hydrocephalus  Propranolol 20MG  taking to manage fluid in her brain. She denies any symptoms like headaches or pressure in her head region.   Dry Skin  Currently using fluocinolone oil prn to help keep skin moisturized.  Needs refill.  Hydradenitis suppurativa Clindamycin 1% and Bactrim DS 160 MG.Her reports that her bactrim DS 160 MG causes her to have a recurrent yeast infections.  She takes 150 mg Diflucan as needed for this  Edema Lasix 20MG  and Potassium chloride 10 MEQ. Her Lasix has help improve her leg swelling.   Nausea  Zofran 4 MG PRN. She is taking this if she feel nauseous after eating. Her episodes her sporadic.    Allergies  Olopatadine 0.2% solution is used for eyedrops for allergies.  This controls her symptoms well.  Denies any concerns.  Iron deficiency anemia Has been told to take oral iron in the past, especially when she was having heavy periods.  But now that she is on Megace she does not have heavy periods and is currently not taking any oral iron.   Health Maintenance Due  Topic Date Due  . COVID-19 Vaccine (1) Never done  . Hepatitis C Screening  Never done  . PAP-Cervical Cytology Screening  Never done    Past Medical History:  Diagnosis Date  . Anemia   . Cerebral palsy (HCC)   . Constipation   . GERD (gastroesophageal reflux disease)   .  History of encephalopathy   . Hydradenitis    bilateral  . Hydrocephaly (HCC)   . Migraine   . Neurogenic bladder   . Seizures (HCC)   . Subclinical hypothyroidism 06/04/2018   04/2018  . Vitamin D deficiency 04/03/2017   Vit d 25oh, 12.7 03/2017 - see [facility] labs  . VP (ventriculoperitoneal) shunt status      Social History   Tobacco Use  . Smoking status: Never  . Smokeless tobacco: Never  Vaping Use  . Vaping Use: Never used  Substance Use Topics  . Alcohol use: No  . Drug use: No    Past Surgical History:  Procedure Laterality Date  . BOTOX INJECTION    . DENTAL EXAMINATION UNDER ANESTHESIA    . EYE SURGERY    . HAMSTRING LENGTHENING    . shunt replacement  2008   5th shunt for pt.   . TENDON LENGTHENING    . VENTRICULOPERITONEAL SHUNT      Family History  Problem Relation Age of Onset  . Diabetes Mother   . Hyperlipidemia Mother   . Hypertension Mother   . Obesity Mother   . Diabetes Brother   . Obesity Brother   . Gastric cancer Maternal Grandfather   . Diabetes Maternal Grandfather     PMHx, SurgHx, SocialHx, FamHx, Medications, and Allergies were reviewed in the Visit Navigator and updated as  appropriate.   Patient Active Problem List   Diagnosis Date Noted  . Vitamin B12 deficiency 06/03/2019  . COVID-19 virus infection 05/15/2019  . Subclinical hypothyroidism 06/04/2018  . Microcytic anemia 04/03/2017  . Vitamin D deficiency 04/03/2017  . VP (ventriculoperitoneal) shunt status 03/28/2017  . Constipated 03/27/2015  . Hidradenitis suppurativa 08/07/2014  . Cerebral palsy, diplegic (HCC) 02/06/2013  . Localization-related symptomatic epilepsy and epileptic syndromes with complex partial seizures, not intractable, without status epilepticus (HCC) 02/06/2013  . Migraine 02/06/2013  . Exotropia 05/21/2010  . Congenital hydrocephalus (HCC) 05/26/2008  . Neurogenic bladder 10/17/2002    Social History   Tobacco Use  . Smoking status: Never  .  Smokeless tobacco: Never  Vaping Use  . Vaping Use: Never used  Substance Use Topics  . Alcohol use: No  . Drug use: No    Current Medications and Allergies:    Current Outpatient Medications:  .  ferrous sulfate 325 (65 FE) MG tablet, Take 1 tablet (325 mg total) by mouth 2 (two) times daily with a meal., Disp: 60 tablet, Rfl: 0 .  ibuprofen (ADVIL) 800 MG tablet, Take 800 mg by mouth every 8 (eight) hours as needed., Disp: , Rfl:  .  megestrol (MEGACE) 40 MG tablet, Take 1 tablet (40 mg total) by mouth daily., Disp: 30 tablet, Rfl: 0 .  Pediatric Multiple Vit-C-FA (PEDIATRIC MULTIVITAMIN) chewable tablet, Chew 2 tablets by mouth daily., Disp: , Rfl:  .  silver sulfADIAZINE (SILVADENE) 1 % cream, Apply 1 application topically daily., Disp: 50 g, Rfl: 0 .  vitamin C (ASCORBIC ACID) 250 MG tablet, Take 250 mg by mouth daily., Disp: , Rfl:  .  baclofen (LIORESAL) 10 MG tablet, TAKE 1 TABLET(10 MG) BY MOUTH TWICE DAILY, Disp: 180 tablet, Rfl: 0 .  clindamycin (CLEOCIN T) 1 % external solution, Apply 1 application topically daily. Under her arm, Disp: 60 mL, Rfl: 0 .  cyclobenzaprine (FLEXERIL) 5 MG tablet, Take 1 tablet (5 mg total) by mouth 3 (three) times daily as needed. for muscle spams, Disp: 270 tablet, Rfl: 0 .  fluconazole (DIFLUCAN) 150 MG tablet, TAKE 1 TABLET BY MOUTH ONCE FOR 1 DOSE AND REPEAT IN 2 DAYS IF NEEDED, Disp: 6 tablet, Rfl: 0 .  Fluocinolone Acetonide Body 0.01 % OIL, Apply 1 application topically as needed. APPLY TO THE SKIN 2-3 TIMES DAILY AS DIRECTED, Disp: 118.28 mL, Rfl: 0 .  furosemide (LASIX) 20 MG tablet, TAKE 1 TABLET BY MOUTH EVERY DAY AS NEEDED FOR EDEMA, Disp: 90 tablet, Rfl: 0 .  Olopatadine HCl 0.2 % SOLN, Place 1 drop into both eyes daily as needed (allergies)., Disp: 2.5 mL, Rfl: 0 .  ondansetron (ZOFRAN-ODT) 4 MG disintegrating tablet, Take 1 tablet (4 mg total) by mouth every 8 (eight) hours as needed for nausea or vomiting., Disp: 30 tablet, Rfl: 0 .   potassium chloride (KLOR-CON) 10 MEQ tablet, Take 1 tablet (10 mEq total) by mouth daily as needed. TAKE 1 TABLET BY MOUTH DAILY AS NEEDED(WITH LASIX), Disp: 90 tablet, Rfl: 0 .  propranolol (INDERAL) 20 MG tablet, Take 1 tablet (20 mg total) by mouth 2 (two) times daily., Disp: 180 tablet, Rfl: 0 .  rizatriptan (MAXALT) 10 MG tablet, TAKE 1 TABLET BY MOUTH AS NEEDED FOR MIGRAINE. MAY REPEAT 2 HOURS IF NEEDED., Disp: 10 tablet, Rfl: 1 .  sulfamethoxazole-trimethoprim (BACTRIM DS) 800-160 MG tablet, Take 1 tablet by mouth 2 (two) times daily. continuous, Disp: 180 tablet, Rfl: 0 .  zonisamide (  ZONEGRAN) 100 MG capsule, Take 1 capsule (100 mg total) by mouth 3 (three) times daily., Disp: 270 capsule, Rfl: 0   Allergies  Allergen Reactions  . Ceftibuten Other (See Comments)    "cant wake her up"  . Fentanyl Hives and Itching  . Tape Hives    "whelts"  Silk tape  . Doxycycline Other (See Comments) and Nausea Only    unknown unknown     Review of Systems   ROS Negative unless otherwise specified per HPI.  Vitals:   Vitals:   12/29/20 1551  BP: 120/86  Pulse: (!) 124  Temp: 98.6 F (37 C)  TempSrc: Temporal  SpO2: 100%  Height: 5\' 4"  (1.626 m)     Body mass index is 28.67 kg/m.   Physical Exam:    Physical Exam Vitals and nursing note reviewed.  Constitutional:      Appearance: Normal appearance. She is normal weight. She is not toxic-appearing.  HENT:     Head: Normocephalic and atraumatic.     Right Ear: Tympanic membrane, ear canal and external ear normal.     Left Ear: Tympanic membrane, ear canal and external ear normal.     Nose: Nose normal.     Mouth/Throat:     Mouth: Mucous membranes are moist.  Eyes:     Extraocular Movements: Extraocular movements intact.     Conjunctiva/sclera: Conjunctivae normal.     Pupils: Pupils are equal, round, and reactive to light.  Cardiovascular:     Rate and Rhythm: Normal rate and regular rhythm.     Pulses: Normal  pulses.     Heart sounds: Normal heart sounds.  Pulmonary:     Effort: Pulmonary effort is normal.     Breath sounds: Normal breath sounds.  Abdominal:     General: Abdomen is flat. Bowel sounds are normal.     Palpations: Abdomen is soft.  Musculoskeletal:     Cervical back: Normal range of motion and neck supple.     Comments: Wheelchair bound  Skin:    General: Skin is warm and dry.  Neurological:     General: No focal deficit present.     Mental Status: She is alert and oriented to person, place, and time.  Psychiatric:        Mood and Affect: Mood normal.        Behavior: Behavior normal.        Thought Content: Thought content normal.        Judgment: Judgment normal.     Assessment and Plan:    Novalynn was seen today for medication refill.  Diagnoses and all orders for this visit:  Iron deficiency anemia due to chronic blood loss Update CBC and provide recommendations accordingly -     CBC with Differential/Platelet -     Comprehensive metabolic panel  Cerebral palsy, diplegic (HCC) Refill her muscle relaxers of Flexeril 5 mg and baclofen 10 mg Further management per specialist -     CBC with Differential/Platelet -     Comprehensive metabolic panel  Need for immunization against influenza -     Flu Vaccine QUAD 72mo+IM (Fluarix, Fluzone & Alfiuria Quad PF)  Hidradenitis suppurativa Well-controlled Continue Bactrim 800-160 mg twice daily and clindamycin external solution as needed  Migraine without status migrainosus, not intractable, unspecified migraine type Well-controlled Continue Zonegran 100 mg 3 times daily and rizatriptan 10 mg as needed  Congenital hydrocephalus (HCC) Currently well controlled with propranolol 20 mg twice daily Further  management per specialist  Dry skin Currently well controlled with fluocinolone oil Refill today  Leg edema Currently well controlled with as needed Lasix 20 mg and potassium 10 mEq Update BMP  today  Nausea Well-controlled with as needed Zofran needs refill Follow-up if worsening  Allergy, subsequent encounter Well-controlled Refill Pataday eyedrops  Other orders -     cyclobenzaprine (FLEXERIL) 5 MG tablet; Take 1 tablet (5 mg total) by mouth 3 (three) times daily as needed. for muscle spams -     baclofen (LIORESAL) 10 MG tablet; TAKE 1 TABLET(10 MG) BY MOUTH TWICE DAILY -     propranolol (INDERAL) 20 MG tablet; Take 1 tablet (20 mg total) by mouth 2 (two) times daily. -     Fluocinolone Acetonide Body 0.01 % OIL; Apply 1 application topically as needed. APPLY TO THE SKIN 2-3 TIMES DAILY AS DIRECTED -     clindamycin (CLEOCIN T) 1 % external solution; Apply 1 application topically daily. Under her arm -     sulfamethoxazole-trimethoprim (BACTRIM DS) 800-160 MG tablet; Take 1 tablet by mouth 2 (two) times daily. continuous -     fluconazole (DIFLUCAN) 150 MG tablet; TAKE 1 TABLET BY MOUTH ONCE FOR 1 DOSE AND REPEAT IN 2 DAYS IF NEEDED -     furosemide (LASIX) 20 MG tablet; TAKE 1 TABLET BY MOUTH EVERY DAY AS NEEDED FOR EDEMA -     potassium chloride (KLOR-CON) 10 MEQ tablet; Take 1 tablet (10 mEq total) by mouth daily as needed. TAKE 1 TABLET BY MOUTH DAILY AS NEEDED(WITH LASIX) -     ondansetron (ZOFRAN-ODT) 4 MG disintegrating tablet; Take 1 tablet (4 mg total) by mouth every 8 (eight) hours as needed for nausea or vomiting. -     Olopatadine HCl 0.2 % SOLN; Place 1 drop into both eyes daily as needed (allergies).    I,Alexis Bryant,acting as a Neurosurgeonscribe for [employer] CorporationSamantha Tra Wilemon, PA.,have documented all relevant documentation on the behalf of Jarold MottoSamantha Oley Lahaie, PA,as directed by  Jarold MottoSamantha Callaway Hailes, PA while in the presence of Jarold MottoSamantha [name] Polak, [place].  I, Jarold MottoSamantha Nikesha Kwasny, [place], have reviewed all documentation for this visit. The documentation on 12/29/20 for the exam, diagnosis, procedures, and orders are all accurate and complete.  Time spent with patient today was 40 minutes which consisted  of chart review, discussing diagnosis, work up, treatment answering questions and documentation.   Jarold MottoSamantha Randee Huston, PA-C [place], [facility] 12/29/2020  Follow-up: No follow-ups on file.

## 2020-12-29 NOTE — Patient Instructions (Signed)
It was great to see you!  I'm going to send in all refills.  Please get your bloodwork today. I'll be in touch with the results  Let's follow-up in 3 months with DR ANDY, sooner if you have concerns.  Take care,  Jarold Motto PA-C

## 2020-12-30 LAB — COMPREHENSIVE METABOLIC PANEL
ALT: 14 U/L (ref 0–35)
AST: 17 U/L (ref 0–37)
Albumin: 4.2 g/dL (ref 3.5–5.2)
Alkaline Phosphatase: 54 U/L (ref 39–117)
BUN: 10 mg/dL (ref 6–23)
CO2: 19 mEq/L (ref 19–32)
Calcium: 9.7 mg/dL (ref 8.4–10.5)
Chloride: 106 mEq/L (ref 96–112)
Creatinine, Ser: 0.7 mg/dL (ref 0.40–1.20)
GFR: 120.38 mL/min (ref >60.00)
Glucose, Bld: 61 mg/dL — ABNORMAL LOW (ref 70–99)
Potassium: 3.2 mEq/L — ABNORMAL LOW (ref 3.5–5.1)
Sodium: 137 mEq/L (ref 135–145)
Total Bilirubin: 0.3 mg/dL (ref 0.2–1.2)
Total Protein: 8 g/dL (ref 6.0–8.3)

## 2020-12-30 LAB — CBC WITH DIFFERENTIAL/PLATELET
Basophils Absolute: 0.1 10*3/uL (ref 0.0–0.1)
Basophils Relative: 0.8 % (ref 0.0–3.0)
Eosinophils Absolute: 0.2 10*3/uL (ref 0.0–0.7)
Eosinophils Relative: 1.4 % (ref 0.0–5.0)
HCT: 43.4 % (ref 36.0–46.0)
Hemoglobin: 13.9 g/dL (ref 12.0–15.0)
Lymphocytes Relative: 19.2 % (ref 12.0–46.0)
Lymphs Abs: 2.4 10*3/uL (ref 0.7–4.0)
MCHC: 32 g/dL (ref 30.0–36.0)
MCV: 80.7 fl (ref 78.0–100.0)
Monocytes Absolute: 0.9 10*3/uL (ref 0.1–1.0)
Monocytes Relative: 7.4 % (ref 3.0–12.0)
Neutro Abs: 8.8 10*3/uL — ABNORMAL HIGH (ref 1.4–7.7)
Neutrophils Relative %: 71.2 % (ref 43.0–77.0)
Platelets: 288 10*3/uL (ref 150.0–400.0)
RBC: 5.38 Mil/uL — ABNORMAL HIGH (ref 3.87–5.11)
RDW: 15.1 % (ref 11.5–15.5)
WBC: 12.4 10*3/uL — ABNORMAL HIGH (ref 4.0–10.5)

## 2021-01-07 ENCOUNTER — Telehealth: Payer: Self-pay

## 2021-01-07 ENCOUNTER — Other Ambulatory Visit: Payer: Self-pay

## 2021-01-07 DIAGNOSIS — L732 Hidradenitis suppurativa: Secondary | ICD-10-CM

## 2021-01-07 MED ORDER — RIZATRIPTAN BENZOATE 10 MG PO TABS: ORAL_TABLET | ORAL | 1 refills | Status: DC

## 2021-01-07 NOTE — Telephone Encounter (Signed)
LAST APPOINTMENT DATE:  12/29/20  NEXT APPOINTMENT DATE: none  MEDICATION:rizatriptan (MAXALT) 10 MG tablet  PHARMACY:[id number] - [place], [place] - [address] AT [facility] [address]

## 2021-01-07 NOTE — Telephone Encounter (Signed)
Medication has been filled 

## 2021-01-14 ENCOUNTER — Encounter: Payer: Self-pay | Admitting: *Deleted

## 2021-01-15 ENCOUNTER — Telehealth: Payer: Self-pay

## 2021-01-15 NOTE — Telephone Encounter (Signed)
LAST APPOINTMENT DATE:  12/29/20  NEXT APPOINTMENT DATE: none  MEDICATION:ibuprofen (ADVIL) 800 MG tablet  potassium chloride (KLOR-CON) 10 MEQ tablet  Olopatadine HCl 0.2 % SOLN  furosemide (LASIX) 20 MG tablet  rizatriptan (MAXALT) 10 MG tablet  PHARMACY:[id number] - [place], [place] - [address] AT [facility] & [address]

## 2021-01-18 ENCOUNTER — Other Ambulatory Visit: Payer: Self-pay

## 2021-01-18 DIAGNOSIS — L732 Hidradenitis suppurativa: Secondary | ICD-10-CM

## 2021-01-18 MED ORDER — FUROSEMIDE 20 MG PO TABS: ORAL_TABLET | ORAL | 0 refills | Status: DC

## 2021-01-18 MED ORDER — IBUPROFEN 800 MG PO TABS
800.0000 mg | ORAL_TABLET | Freq: Three times a day (TID) | ORAL | 0 refills | Status: DC | PRN
Start: 2021-01-18 — End: 2021-07-23

## 2021-01-18 MED ORDER — RIZATRIPTAN BENZOATE 10 MG PO TABS: ORAL_TABLET | ORAL | 1 refills | Status: DC

## 2021-01-18 MED ORDER — OLOPATADINE HCL 0.2 % OP SOLN
1.0000 [drp] | Freq: Every day | OPHTHALMIC | 0 refills | Status: DC | PRN
Start: 2021-01-18 — End: 2021-07-09

## 2021-01-18 MED ORDER — POTASSIUM CHLORIDE CRYS ER 10 MEQ PO TBCR: 10.0000 meq | EXTENDED_RELEASE_TABLET | Freq: Every day | ORAL | 0 refills | Status: DC | PRN

## 2021-01-18 NOTE — Telephone Encounter (Signed)
Medications have been sent

## 2021-01-26 DIAGNOSIS — R32 Unspecified urinary incontinence: Secondary | ICD-10-CM | POA: Diagnosis not present

## 2021-01-26 DIAGNOSIS — G809 Cerebral palsy, unspecified: Secondary | ICD-10-CM | POA: Diagnosis not present

## 2021-01-26 DIAGNOSIS — G910 Communicating hydrocephalus: Secondary | ICD-10-CM | POA: Diagnosis not present

## 2021-01-26 DIAGNOSIS — I499 Cardiac arrhythmia, unspecified: Secondary | ICD-10-CM | POA: Diagnosis not present

## 2021-03-06 ENCOUNTER — Other Ambulatory Visit: Payer: Self-pay | Admitting: Family Medicine

## 2021-03-06 ENCOUNTER — Other Ambulatory Visit: Payer: Self-pay | Admitting: Physician Assistant

## 2021-03-07 ENCOUNTER — Other Ambulatory Visit: Payer: Self-pay | Admitting: Physician Assistant

## 2021-03-07 DIAGNOSIS — L732 Hidradenitis suppurativa: Secondary | ICD-10-CM

## 2021-03-08 ENCOUNTER — Other Ambulatory Visit: Payer: Self-pay | Admitting: Family Medicine

## 2021-03-08 DIAGNOSIS — B372 Candidiasis of skin and nail: Secondary | ICD-10-CM

## 2021-03-09 ENCOUNTER — Other Ambulatory Visit: Payer: Self-pay | Admitting: Physician Assistant

## 2021-03-10 ENCOUNTER — Other Ambulatory Visit: Payer: Self-pay | Admitting: Physician Assistant

## 2021-03-22 ENCOUNTER — Other Ambulatory Visit: Payer: Self-pay | Admitting: Family Medicine

## 2021-03-29 DIAGNOSIS — G910 Communicating hydrocephalus: Secondary | ICD-10-CM | POA: Diagnosis not present

## 2021-03-29 DIAGNOSIS — G809 Cerebral palsy, unspecified: Secondary | ICD-10-CM | POA: Diagnosis not present

## 2021-03-29 DIAGNOSIS — I499 Cardiac arrhythmia, unspecified: Secondary | ICD-10-CM | POA: Diagnosis not present

## 2021-03-29 DIAGNOSIS — R32 Unspecified urinary incontinence: Secondary | ICD-10-CM | POA: Diagnosis not present

## 2021-04-02 ENCOUNTER — Other Ambulatory Visit: Payer: Self-pay | Admitting: Family Medicine

## 2021-04-05 ENCOUNTER — Other Ambulatory Visit: Payer: Self-pay | Admitting: Physician Assistant

## 2021-04-06 ENCOUNTER — Other Ambulatory Visit: Payer: Self-pay | Admitting: Family Medicine

## 2021-04-06 DIAGNOSIS — L03319 Cellulitis of trunk, unspecified: Secondary | ICD-10-CM | POA: Diagnosis not present

## 2021-06-01 ENCOUNTER — Other Ambulatory Visit: Payer: Self-pay | Admitting: Family Medicine

## 2021-06-01 ENCOUNTER — Other Ambulatory Visit: Payer: Self-pay | Admitting: Physician Assistant

## 2021-06-03 NOTE — Telephone Encounter (Signed)
Pt has not been seen by me in 2 years. Cannot refill any meds. Please make note in chart to not refill any meds. She would need to establish again with in office.  Thanks.

## 2021-06-04 ENCOUNTER — Telehealth: Payer: Self-pay | Admitting: Family Medicine

## 2021-06-04 NOTE — Telephone Encounter (Signed)
Patient's mother called wanting to get refills for the patient. Medications had been denied at the pharmacy. Pt's mom wanted to schedule pt for appt. I did not realize that the pt has possibly been dismissed from our practice.I advised pt's mom that we would call her back after a determination has been made.       [facility].

## 2021-06-07 NOTE — Telephone Encounter (Signed)
See note

## 2021-06-10 DIAGNOSIS — K5900 Constipation, unspecified: Secondary | ICD-10-CM | POA: Diagnosis not present

## 2021-06-10 DIAGNOSIS — L03311 Cellulitis of abdominal wall: Secondary | ICD-10-CM | POA: Diagnosis not present

## 2021-07-01 NOTE — Telephone Encounter (Signed)
Jody [name] can you please follow up with this patients mother.  ?

## 2021-07-03 DIAGNOSIS — I499 Cardiac arrhythmia, unspecified: Secondary | ICD-10-CM | POA: Diagnosis not present

## 2021-07-03 DIAGNOSIS — G910 Communicating hydrocephalus: Secondary | ICD-10-CM | POA: Diagnosis not present

## 2021-07-03 DIAGNOSIS — G809 Cerebral palsy, unspecified: Secondary | ICD-10-CM | POA: Diagnosis not present

## 2021-07-03 DIAGNOSIS — R32 Unspecified urinary incontinence: Secondary | ICD-10-CM | POA: Diagnosis not present

## 2021-07-05 ENCOUNTER — Telehealth: Payer: Self-pay

## 2021-07-05 NOTE — Telephone Encounter (Signed)
Pt's mother called back in and is wanting to know what is going on. She is upset stating she never received a call back. Please advise ?

## 2021-07-05 NOTE — Telephone Encounter (Signed)
Called and left a voice message to return my call.Dr. Mardelle Matte will refill her daughter's medication for 3 months until she is able to establish with another primary care. She has a questions regarding a growth on her daughter's hip that was seen by urgent care. Dr, Mardelle Matte advised to please establish with a new primary care provider as she has not seen her in over a year and a half. She has had several no show's and cancellations and it would be best to establish at another location that may be a better fit or easier to get to. ?

## 2021-07-09 ENCOUNTER — Telehealth: Payer: Self-pay

## 2021-07-09 ENCOUNTER — Other Ambulatory Visit: Payer: Self-pay

## 2021-07-09 DIAGNOSIS — L732 Hidradenitis suppurativa: Secondary | ICD-10-CM

## 2021-07-09 MED ORDER — RIZATRIPTAN BENZOATE 10 MG PO TABS: ORAL_TABLET | ORAL | 1 refills | Status: DC

## 2021-07-09 MED ORDER — PROPRANOLOL HCL 20 MG PO TABS: 20.0000 mg | ORAL_TABLET | Freq: Two times a day (BID) | ORAL | 0 refills | Status: DC

## 2021-07-09 MED ORDER — ZONISAMIDE 100 MG PO CAPS: ORAL_CAPSULE | ORAL | 0 refills | Status: DC

## 2021-07-09 MED ORDER — FUROSEMIDE 20 MG PO TABS: ORAL_TABLET | ORAL | 0 refills | Status: DC

## 2021-07-09 MED ORDER — SULFAMETHOXAZOLE-TRIMETHOPRIM 800-160 MG PO TABS: 1.0000 | ORAL_TABLET | Freq: Two times a day (BID) | ORAL | 0 refills | Status: DC

## 2021-07-09 MED ORDER — BACLOFEN 10 MG PO TABS: ORAL_TABLET | ORAL | 0 refills | Status: DC

## 2021-07-09 MED ORDER — FLUOCINOLONE ACETONIDE BODY 0.01 % EX OIL: TOPICAL_OIL | CUTANEOUS | 0 refills | Status: DC

## 2021-07-09 MED ORDER — MEGESTROL ACETATE 40 MG PO TABS: 40.0000 mg | ORAL_TABLET | Freq: Every day | ORAL | 0 refills | Status: DC

## 2021-07-09 MED ORDER — CLINDAMYCIN PHOSPHATE 1 % EX SOLN: CUTANEOUS | 0 refills | Status: DC

## 2021-07-09 MED ORDER — POTASSIUM CHLORIDE CRYS ER 10 MEQ PO TBCR: 10.0000 meq | EXTENDED_RELEASE_TABLET | Freq: Every day | ORAL | 0 refills | Status: AC | PRN

## 2021-07-09 MED ORDER — SILVER SULFADIAZINE 1 % EX CREA: 1.0000 "application " | TOPICAL_CREAM | Freq: Every day | CUTANEOUS | 0 refills | Status: DC

## 2021-07-09 MED ORDER — OLOPATADINE HCL 0.2 % OP SOLN: 1.0000 [drp] | Freq: Every day | OPHTHALMIC | 1 refills | Status: AC | PRN

## 2021-07-09 NOTE — Telephone Encounter (Signed)
I contacted pt's Mother-[name] (who is listed on DPR) to verify which pharmacy to send medication refills to also which medications need to be refilled. I will send 90 day supply's to pharmacy.   ?

## 2021-07-09 NOTE — Telephone Encounter (Signed)
Please call mother,Jody [name] and ask her which medicines to refill? Dr. Mardelle Matte will be giving her 3 months of meds until she can locate another primary care provider.  She would also like a letter sent to her apartment complex stating that her daughter needs an assigned or reserved parking spot as she is disabled and this would facilitate the availability to park when she comes and goes from her home.  I spoke with [name] at [facility] apartments and she would like this letter fax to her attn: fax number is [phone].  If you wish to contact the apartment manager you can call [phone]. ?Please mail a copy of the letter to [name] at [address], [address] [place], [place] [address] ?

## 2021-07-09 NOTE — Telephone Encounter (Signed)
Pt's mother returning your call. She is asking for you to call her back. ?

## 2021-07-09 NOTE — Telephone Encounter (Signed)
Please advise for Handicap letter. ?

## 2021-07-14 ENCOUNTER — Encounter: Payer: Self-pay | Admitting: Family Medicine

## 2021-07-14 NOTE — Telephone Encounter (Signed)
Letter has mailed both mailed and faxed. ?

## 2021-07-15 NOTE — Telephone Encounter (Signed)
Please disregard message below. Letter has been both mailed and faxed. ?

## 2021-07-23 ENCOUNTER — Telehealth: Payer: Self-pay | Admitting: Family Medicine

## 2021-07-23 ENCOUNTER — Other Ambulatory Visit: Payer: Self-pay

## 2021-07-23 DIAGNOSIS — L732 Hidradenitis suppurativa: Secondary | ICD-10-CM

## 2021-07-23 MED ORDER — FLUOCINOLONE ACETONIDE BODY 0.01 % EX OIL
TOPICAL_OIL | CUTANEOUS | 0 refills | Status: AC
Start: 2021-07-23 — End: ?

## 2021-07-23 MED ORDER — BACLOFEN 10 MG PO TABS
ORAL_TABLET | ORAL | 0 refills | Status: AC
Start: 2021-07-23 — End: ?

## 2021-07-23 MED ORDER — CYCLOBENZAPRINE HCL 5 MG PO TABS: 5.0000 mg | ORAL_TABLET | Freq: Three times a day (TID) | ORAL | 0 refills | Status: AC | PRN

## 2021-07-23 MED ORDER — IBUPROFEN 800 MG PO TABS: 800.0000 mg | ORAL_TABLET | Freq: Three times a day (TID) | ORAL | 0 refills | Status: DC | PRN

## 2021-07-23 MED ORDER — SULFAMETHOXAZOLE-TRIMETHOPRIM 800-160 MG PO TABS: 1.0000 | ORAL_TABLET | Freq: Two times a day (BID) | ORAL | 0 refills | Status: AC

## 2021-07-23 MED ORDER — FUROSEMIDE 20 MG PO TABS: ORAL_TABLET | ORAL | 0 refills | Status: AC

## 2021-07-23 MED ORDER — SILVER SULFADIAZINE 1 % EX CREA: 1.0000 "application " | TOPICAL_CREAM | Freq: Every day | CUTANEOUS | 0 refills | Status: DC

## 2021-07-23 MED ORDER — PROPRANOLOL HCL 20 MG PO TABS: 20.0000 mg | ORAL_TABLET | Freq: Two times a day (BID) | ORAL | 0 refills | Status: AC

## 2021-07-23 MED ORDER — RIZATRIPTAN BENZOATE 10 MG PO TABS: ORAL_TABLET | ORAL | 1 refills | Status: AC

## 2021-07-23 MED ORDER — CLINDAMYCIN PHOSPHATE 1 % EX SOLN: CUTANEOUS | 0 refills | Status: AC

## 2021-07-23 MED ORDER — ZONISAMIDE 100 MG PO CAPS: ORAL_CAPSULE | ORAL | 0 refills | Status: AC

## 2021-07-23 MED ORDER — MEGESTROL ACETATE 40 MG PO TABS
40.0000 mg | ORAL_TABLET | Freq: Every day | ORAL | 0 refills | Status: AC
Start: 2021-07-23 — End: ?

## 2021-07-23 NOTE — Telephone Encounter (Signed)
Rx resent to [place] [address].  ?

## 2021-07-23 NOTE — Telephone Encounter (Signed)
Pt's mother states that Jody [name] is stating they only received 3 medications for refills. She is asking for all of her medications to be refilled, including Ibuprofen 800mg , Flexeril 5mg , and Zofran 4mg . She is very upset and is wanting a call back today from [place]. Please advise ?

## 2021-07-24 ENCOUNTER — Other Ambulatory Visit: Payer: Self-pay | Admitting: Family Medicine

## 2021-08-12 ENCOUNTER — Other Ambulatory Visit: Payer: Self-pay | Admitting: Family Medicine

## 2021-09-01 DIAGNOSIS — G809 Cerebral palsy, unspecified: Secondary | ICD-10-CM | POA: Diagnosis not present

## 2021-09-01 DIAGNOSIS — R32 Unspecified urinary incontinence: Secondary | ICD-10-CM | POA: Diagnosis not present

## 2021-09-01 DIAGNOSIS — G910 Communicating hydrocephalus: Secondary | ICD-10-CM | POA: Diagnosis not present

## 2021-09-01 DIAGNOSIS — I499 Cardiac arrhythmia, unspecified: Secondary | ICD-10-CM | POA: Diagnosis not present

## 2021-09-29 ENCOUNTER — Other Ambulatory Visit: Payer: Self-pay | Admitting: Family Medicine

## 2021-10-12 ENCOUNTER — Other Ambulatory Visit: Payer: Self-pay | Admitting: Family Medicine

## 2021-10-18 DIAGNOSIS — G40209 Localization-related (focal) (partial) symptomatic epilepsy and epileptic syndromes with complex partial seizures, not intractable, without status epilepticus: Secondary | ICD-10-CM | POA: Diagnosis not present

## 2021-10-18 DIAGNOSIS — G803 Athetoid cerebral palsy: Secondary | ICD-10-CM | POA: Diagnosis not present

## 2021-10-18 DIAGNOSIS — Q039 Congenital hydrocephalus, unspecified: Secondary | ICD-10-CM | POA: Diagnosis not present

## 2021-10-18 DIAGNOSIS — G8929 Other chronic pain: Secondary | ICD-10-CM | POA: Diagnosis not present

## 2021-10-18 DIAGNOSIS — M21851 Other specified acquired deformities of right thigh: Secondary | ICD-10-CM | POA: Diagnosis not present

## 2021-10-18 DIAGNOSIS — Z743 Need for continuous supervision: Secondary | ICD-10-CM | POA: Diagnosis not present

## 2021-10-18 DIAGNOSIS — M545 Low back pain, unspecified: Secondary | ICD-10-CM | POA: Diagnosis not present

## 2021-10-18 DIAGNOSIS — G808 Other cerebral palsy: Secondary | ICD-10-CM | POA: Diagnosis not present

## 2021-10-18 DIAGNOSIS — N926 Irregular menstruation, unspecified: Secondary | ICD-10-CM | POA: Diagnosis not present

## 2021-11-05 DIAGNOSIS — G910 Communicating hydrocephalus: Secondary | ICD-10-CM | POA: Diagnosis not present

## 2021-11-05 DIAGNOSIS — G809 Cerebral palsy, unspecified: Secondary | ICD-10-CM | POA: Diagnosis not present

## 2021-11-05 DIAGNOSIS — I499 Cardiac arrhythmia, unspecified: Secondary | ICD-10-CM | POA: Diagnosis not present

## 2021-11-05 DIAGNOSIS — R32 Unspecified urinary incontinence: Secondary | ICD-10-CM | POA: Diagnosis not present

## 2021-11-22 DIAGNOSIS — R110 Nausea: Secondary | ICD-10-CM | POA: Diagnosis not present

## 2021-11-22 DIAGNOSIS — R600 Localized edema: Secondary | ICD-10-CM | POA: Diagnosis not present

## 2021-11-22 DIAGNOSIS — Q039 Congenital hydrocephalus, unspecified: Secondary | ICD-10-CM | POA: Diagnosis not present

## 2021-11-22 DIAGNOSIS — T7840XS Allergy, unspecified, sequela: Secondary | ICD-10-CM | POA: Diagnosis not present

## 2021-11-22 DIAGNOSIS — L853 Xerosis cutis: Secondary | ICD-10-CM | POA: Diagnosis not present

## 2021-11-22 DIAGNOSIS — N921 Excessive and frequent menstruation with irregular cycle: Secondary | ICD-10-CM | POA: Diagnosis not present

## 2021-11-22 DIAGNOSIS — G43909 Migraine, unspecified, not intractable, without status migrainosus: Secondary | ICD-10-CM | POA: Diagnosis not present

## 2021-11-22 DIAGNOSIS — G809 Cerebral palsy, unspecified: Secondary | ICD-10-CM | POA: Diagnosis not present

## 2021-11-22 DIAGNOSIS — L732 Hidradenitis suppurativa: Secondary | ICD-10-CM | POA: Diagnosis not present

## 2021-11-26 DIAGNOSIS — R937 Abnormal findings on diagnostic imaging of other parts of musculoskeletal system: Secondary | ICD-10-CM | POA: Diagnosis not present

## 2021-11-26 DIAGNOSIS — M5136 Other intervertebral disc degeneration, lumbar region: Secondary | ICD-10-CM | POA: Diagnosis not present

## 2021-11-26 DIAGNOSIS — M1611 Unilateral primary osteoarthritis, right hip: Secondary | ICD-10-CM | POA: Diagnosis not present

## 2021-11-26 DIAGNOSIS — M47816 Spondylosis without myelopathy or radiculopathy, lumbar region: Secondary | ICD-10-CM | POA: Diagnosis not present

## 2021-11-26 DIAGNOSIS — Q6589 Other specified congenital deformities of hip: Secondary | ICD-10-CM | POA: Diagnosis not present

## 2021-11-26 DIAGNOSIS — M4186 Other forms of scoliosis, lumbar region: Secondary | ICD-10-CM | POA: Diagnosis not present

## 2021-11-26 DIAGNOSIS — G809 Cerebral palsy, unspecified: Secondary | ICD-10-CM | POA: Diagnosis not present

## 2021-11-26 DIAGNOSIS — G808 Other cerebral palsy: Secondary | ICD-10-CM | POA: Diagnosis not present

## 2021-11-26 DIAGNOSIS — M160 Bilateral primary osteoarthritis of hip: Secondary | ICD-10-CM | POA: Diagnosis not present

## 2021-12-28 DIAGNOSIS — I499 Cardiac arrhythmia, unspecified: Secondary | ICD-10-CM | POA: Diagnosis not present

## 2021-12-28 DIAGNOSIS — G910 Communicating hydrocephalus: Secondary | ICD-10-CM | POA: Diagnosis not present

## 2021-12-28 DIAGNOSIS — G809 Cerebral palsy, unspecified: Secondary | ICD-10-CM | POA: Diagnosis not present

## 2021-12-28 DIAGNOSIS — R32 Unspecified urinary incontinence: Secondary | ICD-10-CM | POA: Diagnosis not present

## 2021-12-30 DIAGNOSIS — R159 Full incontinence of feces: Secondary | ICD-10-CM | POA: Diagnosis not present

## 2021-12-30 DIAGNOSIS — R61 Generalized hyperhidrosis: Secondary | ICD-10-CM | POA: Diagnosis not present

## 2021-12-30 DIAGNOSIS — R32 Unspecified urinary incontinence: Secondary | ICD-10-CM | POA: Diagnosis not present

## 2021-12-30 DIAGNOSIS — G808 Other cerebral palsy: Secondary | ICD-10-CM | POA: Diagnosis not present

## 2021-12-30 DIAGNOSIS — G801 Spastic diplegic cerebral palsy: Secondary | ICD-10-CM | POA: Diagnosis not present

## 2021-12-30 DIAGNOSIS — L732 Hidradenitis suppurativa: Secondary | ICD-10-CM | POA: Diagnosis not present

## 2021-12-30 DIAGNOSIS — M545 Low back pain, unspecified: Secondary | ICD-10-CM | POA: Diagnosis not present

## 2021-12-30 DIAGNOSIS — G8929 Other chronic pain: Secondary | ICD-10-CM | POA: Diagnosis not present

## 2021-12-30 DIAGNOSIS — M4146 Neuromuscular scoliosis, lumbar region: Secondary | ICD-10-CM | POA: Diagnosis not present

## 2022-01-24 ENCOUNTER — Encounter: Payer: Self-pay | Admitting: *Deleted

## 2022-02-22 ENCOUNTER — Other Ambulatory Visit: Payer: Self-pay | Admitting: Family Medicine

## 2022-02-27 ENCOUNTER — Other Ambulatory Visit: Payer: Self-pay | Admitting: Family Medicine

## 2022-03-01 DIAGNOSIS — R32 Unspecified urinary incontinence: Secondary | ICD-10-CM | POA: Diagnosis not present

## 2022-03-01 DIAGNOSIS — G910 Communicating hydrocephalus: Secondary | ICD-10-CM | POA: Diagnosis not present

## 2022-03-01 DIAGNOSIS — I499 Cardiac arrhythmia, unspecified: Secondary | ICD-10-CM | POA: Diagnosis not present

## 2022-03-01 DIAGNOSIS — G809 Cerebral palsy, unspecified: Secondary | ICD-10-CM | POA: Diagnosis not present

## 2022-03-18 DIAGNOSIS — F419 Anxiety disorder, unspecified: Secondary | ICD-10-CM | POA: Diagnosis not present

## 2022-03-18 DIAGNOSIS — R110 Nausea: Secondary | ICD-10-CM | POA: Diagnosis not present

## 2022-03-18 DIAGNOSIS — R600 Localized edema: Secondary | ICD-10-CM | POA: Diagnosis not present

## 2022-03-18 DIAGNOSIS — G43909 Migraine, unspecified, not intractable, without status migrainosus: Secondary | ICD-10-CM | POA: Diagnosis not present

## 2022-03-18 DIAGNOSIS — Q039 Congenital hydrocephalus, unspecified: Secondary | ICD-10-CM | POA: Diagnosis not present

## 2022-03-18 DIAGNOSIS — F32A Depression, unspecified: Secondary | ICD-10-CM | POA: Diagnosis not present

## 2022-03-18 DIAGNOSIS — B350 Tinea barbae and tinea capitis: Secondary | ICD-10-CM | POA: Diagnosis not present

## 2022-04-05 ENCOUNTER — Other Ambulatory Visit: Payer: Self-pay | Admitting: Family Medicine

## 2022-04-14 ENCOUNTER — Encounter: Payer: Self-pay | Admitting: *Deleted

## 2022-04-19 DIAGNOSIS — M5440 Lumbago with sciatica, unspecified side: Secondary | ICD-10-CM | POA: Diagnosis not present

## 2022-04-19 DIAGNOSIS — G8929 Other chronic pain: Secondary | ICD-10-CM | POA: Diagnosis not present

## 2022-05-30 ENCOUNTER — Other Ambulatory Visit: Payer: Self-pay | Admitting: Family Medicine

## 2022-06-06 DIAGNOSIS — I499 Cardiac arrhythmia, unspecified: Secondary | ICD-10-CM | POA: Diagnosis not present

## 2022-06-06 DIAGNOSIS — R32 Unspecified urinary incontinence: Secondary | ICD-10-CM | POA: Diagnosis not present

## 2022-06-06 DIAGNOSIS — G809 Cerebral palsy, unspecified: Secondary | ICD-10-CM | POA: Diagnosis not present

## 2022-06-06 DIAGNOSIS — G910 Communicating hydrocephalus: Secondary | ICD-10-CM | POA: Diagnosis not present

## 2022-06-07 DIAGNOSIS — G910 Communicating hydrocephalus: Secondary | ICD-10-CM | POA: Diagnosis not present

## 2022-06-07 DIAGNOSIS — R32 Unspecified urinary incontinence: Secondary | ICD-10-CM | POA: Diagnosis not present

## 2022-06-07 DIAGNOSIS — I499 Cardiac arrhythmia, unspecified: Secondary | ICD-10-CM | POA: Diagnosis not present

## 2022-06-07 DIAGNOSIS — G809 Cerebral palsy, unspecified: Secondary | ICD-10-CM | POA: Diagnosis not present

## 2022-06-20 DIAGNOSIS — F32A Depression, unspecified: Secondary | ICD-10-CM | POA: Diagnosis not present

## 2022-07-22 DIAGNOSIS — G809 Cerebral palsy, unspecified: Secondary | ICD-10-CM | POA: Diagnosis not present

## 2022-07-22 DIAGNOSIS — R32 Unspecified urinary incontinence: Secondary | ICD-10-CM | POA: Diagnosis not present

## 2022-07-22 DIAGNOSIS — I499 Cardiac arrhythmia, unspecified: Secondary | ICD-10-CM | POA: Diagnosis not present

## 2022-07-22 DIAGNOSIS — G910 Communicating hydrocephalus: Secondary | ICD-10-CM | POA: Diagnosis not present

## 2022-07-31 ENCOUNTER — Other Ambulatory Visit: Payer: Self-pay | Admitting: Family Medicine

## 2022-08-01 DIAGNOSIS — G801 Spastic diplegic cerebral palsy: Secondary | ICD-10-CM | POA: Diagnosis not present

## 2022-08-01 DIAGNOSIS — R600 Localized edema: Secondary | ICD-10-CM | POA: Diagnosis not present

## 2022-08-01 DIAGNOSIS — M4145 Neuromuscular scoliosis, thoracolumbar region: Secondary | ICD-10-CM | POA: Diagnosis not present

## 2022-08-01 DIAGNOSIS — M62452 Contracture of muscle, left thigh: Secondary | ICD-10-CM | POA: Diagnosis not present

## 2022-08-01 DIAGNOSIS — Q6589 Other specified congenital deformities of hip: Secondary | ICD-10-CM | POA: Diagnosis not present

## 2022-08-01 DIAGNOSIS — R238 Other skin changes: Secondary | ICD-10-CM | POA: Diagnosis not present

## 2022-08-01 DIAGNOSIS — M62451 Contracture of muscle, right thigh: Secondary | ICD-10-CM | POA: Diagnosis not present

## 2022-08-01 DIAGNOSIS — E669 Obesity, unspecified: Secondary | ICD-10-CM | POA: Diagnosis not present

## 2022-08-01 DIAGNOSIS — M545 Low back pain, unspecified: Secondary | ICD-10-CM | POA: Diagnosis not present

## 2022-08-08 DIAGNOSIS — Z982 Presence of cerebrospinal fluid drainage device: Secondary | ICD-10-CM | POA: Diagnosis not present

## 2022-08-08 DIAGNOSIS — G808 Other cerebral palsy: Secondary | ICD-10-CM | POA: Diagnosis not present

## 2022-08-08 DIAGNOSIS — G801 Spastic diplegic cerebral palsy: Secondary | ICD-10-CM | POA: Diagnosis not present

## 2022-08-08 DIAGNOSIS — G9340 Encephalopathy, unspecified: Secondary | ICD-10-CM | POA: Diagnosis not present

## 2022-08-08 DIAGNOSIS — Q039 Congenital hydrocephalus, unspecified: Secondary | ICD-10-CM | POA: Diagnosis not present

## 2022-08-08 DIAGNOSIS — G40209 Localization-related (focal) (partial) symptomatic epilepsy and epileptic syndromes with complex partial seizures, not intractable, without status epilepticus: Secondary | ICD-10-CM | POA: Diagnosis not present

## 2022-08-08 DIAGNOSIS — E119 Type 2 diabetes mellitus without complications: Secondary | ICD-10-CM | POA: Diagnosis not present

## 2022-08-08 DIAGNOSIS — G800 Spastic quadriplegic cerebral palsy: Secondary | ICD-10-CM | POA: Diagnosis not present

## 2022-08-08 DIAGNOSIS — G919 Hydrocephalus, unspecified: Secondary | ICD-10-CM | POA: Diagnosis not present

## 2022-10-05 DIAGNOSIS — I499 Cardiac arrhythmia, unspecified: Secondary | ICD-10-CM | POA: Diagnosis not present

## 2022-10-05 DIAGNOSIS — R32 Unspecified urinary incontinence: Secondary | ICD-10-CM | POA: Diagnosis not present

## 2022-10-05 DIAGNOSIS — G809 Cerebral palsy, unspecified: Secondary | ICD-10-CM | POA: Diagnosis not present

## 2022-10-05 DIAGNOSIS — G910 Communicating hydrocephalus: Secondary | ICD-10-CM | POA: Diagnosis not present

## 2023-01-12 ENCOUNTER — Other Ambulatory Visit: Payer: Self-pay | Admitting: Family Medicine

## 2023-04-03 ENCOUNTER — Other Ambulatory Visit: Payer: Self-pay | Admitting: Family Medicine
